# Patient Record
Sex: Female | Born: 1935 | Race: White | Hispanic: No | Smoking: Never smoker
Health system: Southern US, Community
[De-identification: ages and names within clinical notes are randomized; demographics above are authoritative.]

## PROBLEM LIST (undated history)

## (undated) DIAGNOSIS — J449 Chronic obstructive pulmonary disease, unspecified: Secondary | ICD-10-CM

## (undated) DIAGNOSIS — M1990 Unspecified osteoarthritis, unspecified site: Secondary | ICD-10-CM

## (undated) DIAGNOSIS — M797 Fibromyalgia: Secondary | ICD-10-CM

## (undated) DIAGNOSIS — H3530 Unspecified macular degeneration: Secondary | ICD-10-CM

## (undated) DIAGNOSIS — I10 Essential (primary) hypertension: Secondary | ICD-10-CM

## (undated) HISTORY — DX: Unspecified osteoarthritis, unspecified site: M19.90

## (undated) HISTORY — DX: Fibromyalgia: M79.7

## (undated) HISTORY — DX: Unspecified macular degeneration: H35.30

## (undated) HISTORY — PX: CHOLECYSTECTOMY: SHX55

---

## 1968-06-15 HISTORY — PX: ABDOMINAL HYSTERECTOMY: SHX81

## 1983-06-16 HISTORY — PX: BREAST SURGERY: SHX581

## 2004-07-14 ENCOUNTER — Ambulatory Visit: Payer: Self-pay | Admitting: Family Medicine

## 2005-06-04 ENCOUNTER — Ambulatory Visit: Payer: Self-pay | Admitting: Family Medicine

## 2005-09-08 ENCOUNTER — Ambulatory Visit: Payer: Self-pay | Admitting: Family Medicine

## 2005-11-13 ENCOUNTER — Ambulatory Visit: Payer: Self-pay | Admitting: Internal Medicine

## 2006-03-31 ENCOUNTER — Ambulatory Visit (HOSPITAL_COMMUNITY): Admission: RE | Admit: 2006-03-31 | Discharge: 2006-03-31 | Payer: Self-pay | Admitting: *Deleted

## 2007-02-04 ENCOUNTER — Ambulatory Visit: Payer: Self-pay | Admitting: Family Medicine

## 2007-03-07 ENCOUNTER — Ambulatory Visit: Payer: Self-pay | Admitting: Family Medicine

## 2007-03-10 ENCOUNTER — Ambulatory Visit: Payer: Self-pay | Admitting: Gastroenterology

## 2007-03-10 LAB — HM COLONOSCOPY

## 2008-04-19 ENCOUNTER — Ambulatory Visit: Payer: Self-pay | Admitting: Family Medicine

## 2008-12-11 ENCOUNTER — Ambulatory Visit: Payer: Self-pay | Admitting: Family Medicine

## 2010-04-29 ENCOUNTER — Ambulatory Visit: Payer: Self-pay | Admitting: Family Medicine

## 2010-09-04 ENCOUNTER — Encounter: Payer: Self-pay | Admitting: Neurology

## 2010-09-14 ENCOUNTER — Encounter: Payer: Self-pay | Admitting: Neurology

## 2011-08-26 ENCOUNTER — Ambulatory Visit: Payer: Self-pay | Admitting: Family Medicine

## 2011-09-03 ENCOUNTER — Ambulatory Visit: Payer: Self-pay | Admitting: Family Medicine

## 2012-05-11 ENCOUNTER — Ambulatory Visit: Payer: Self-pay | Admitting: Family Medicine

## 2012-12-22 ENCOUNTER — Emergency Department: Payer: Self-pay

## 2012-12-22 LAB — CBC
HCT: 42.1 % (ref 35.0–47.0)
MCH: 32.7 pg (ref 26.0–34.0)
MCHC: 35.1 g/dL (ref 32.0–36.0)
MCV: 93 fL (ref 80–100)
Platelet: 212 10*3/uL (ref 150–440)
RDW: 12.9 % (ref 11.5–14.5)

## 2012-12-22 LAB — BASIC METABOLIC PANEL
BUN: 10 mg/dL (ref 7–18)
Calcium, Total: 8.5 mg/dL (ref 8.5–10.1)
Chloride: 108 mmol/L — ABNORMAL HIGH (ref 98–107)
Creatinine: 0.87 mg/dL (ref 0.60–1.30)
EGFR (African American): >60
Glucose: 129 mg/dL — ABNORMAL HIGH (ref 65–99)
Osmolality: 280 (ref 275–301)
Potassium: 3.6 mmol/L (ref 3.5–5.1)

## 2012-12-22 LAB — TROPONIN I
Troponin-I: <0.02 ng/mL
Troponin-I: <0.02 ng/mL

## 2012-12-22 LAB — CK TOTAL AND CKMB (NOT AT ARMC): CK, Total: 111 U/L (ref 21–215)

## 2013-06-19 ENCOUNTER — Ambulatory Visit: Payer: Self-pay | Admitting: Family Medicine

## 2013-07-17 ENCOUNTER — Ambulatory Visit: Payer: Self-pay | Admitting: Family Medicine

## 2013-11-15 LAB — LIPID PANEL
Cholesterol: 269 mg/dL — AB (ref 0–200)
HDL: 39 mg/dL (ref 35–70)
LDL CALC: 197 mg/dL
LDl/HDL Ratio: 5.1
Triglycerides: 164 mg/dL — AB (ref 40–160)

## 2013-11-15 LAB — HEPATIC FUNCTION PANEL
ALK PHOS: 71 U/L (ref 25–125)
ALT: 16 U/L (ref 7–35)
AST: 22 U/L (ref 13–35)
BILIRUBIN, TOTAL: 0.7 mg/dL

## 2013-11-15 LAB — TSH: TSH: 2.11 u[IU]/mL (ref 0.41–5.90)

## 2013-11-15 LAB — BASIC METABOLIC PANEL
BUN: 11 mg/dL (ref 4–21)
Creatinine: 0.8 mg/dL (ref 0.5–1.1)
Glucose: 109 mg/dL
POTASSIUM: 4.3 mmol/L (ref 3.4–5.3)
Sodium: 141 mmol/L (ref 137–147)

## 2013-11-15 LAB — CBC AND DIFFERENTIAL
HCT: 44 % (ref 36–46)
Hemoglobin: 15.3 g/dL (ref 12.0–16.0)
NEUTROS ABS: 5 /uL
Platelets: 249 10*3/uL (ref 150–399)
WBC: 8.7 10*3/mL

## 2014-04-06 DIAGNOSIS — I2510 Atherosclerotic heart disease of native coronary artery without angina pectoris: Secondary | ICD-10-CM | POA: Insufficient documentation

## 2014-04-06 DIAGNOSIS — I10 Essential (primary) hypertension: Secondary | ICD-10-CM | POA: Insufficient documentation

## 2014-04-06 DIAGNOSIS — E782 Mixed hyperlipidemia: Secondary | ICD-10-CM | POA: Insufficient documentation

## 2014-05-15 LAB — HEMOGLOBIN A1C: HEMOGLOBIN A1C: 5.3 % (ref 4.0–6.0)

## 2014-05-28 ENCOUNTER — Ambulatory Visit: Payer: Self-pay | Admitting: Family Medicine

## 2014-05-28 LAB — HM MAMMOGRAPHY: HM MAMMO: NEGATIVE

## 2014-11-05 DIAGNOSIS — I10 Essential (primary) hypertension: Secondary | ICD-10-CM | POA: Insufficient documentation

## 2014-11-05 DIAGNOSIS — E785 Hyperlipidemia, unspecified: Secondary | ICD-10-CM | POA: Insufficient documentation

## 2014-11-05 DIAGNOSIS — K219 Gastro-esophageal reflux disease without esophagitis: Secondary | ICD-10-CM | POA: Insufficient documentation

## 2014-11-05 DIAGNOSIS — G64 Other disorders of peripheral nervous system: Secondary | ICD-10-CM | POA: Insufficient documentation

## 2014-11-05 DIAGNOSIS — M797 Fibromyalgia: Secondary | ICD-10-CM | POA: Insufficient documentation

## 2014-11-05 DIAGNOSIS — R739 Hyperglycemia, unspecified: Secondary | ICD-10-CM | POA: Insufficient documentation

## 2014-11-05 DIAGNOSIS — M1990 Unspecified osteoarthritis, unspecified site: Secondary | ICD-10-CM | POA: Insufficient documentation

## 2014-11-05 DIAGNOSIS — F32A Depression, unspecified: Secondary | ICD-10-CM | POA: Insufficient documentation

## 2014-11-05 DIAGNOSIS — G629 Polyneuropathy, unspecified: Secondary | ICD-10-CM | POA: Insufficient documentation

## 2014-11-05 DIAGNOSIS — F329 Major depressive disorder, single episode, unspecified: Secondary | ICD-10-CM | POA: Insufficient documentation

## 2014-11-05 DIAGNOSIS — E669 Obesity, unspecified: Secondary | ICD-10-CM | POA: Insufficient documentation

## 2014-11-05 DIAGNOSIS — E119 Type 2 diabetes mellitus without complications: Secondary | ICD-10-CM | POA: Insufficient documentation

## 2014-11-05 DIAGNOSIS — M549 Dorsalgia, unspecified: Secondary | ICD-10-CM | POA: Insufficient documentation

## 2014-12-25 ENCOUNTER — Ambulatory Visit (INDEPENDENT_AMBULATORY_CARE_PROVIDER_SITE_OTHER): Payer: Medicare Other | Admitting: Family Medicine

## 2014-12-25 ENCOUNTER — Encounter: Payer: Self-pay | Admitting: Family Medicine

## 2014-12-25 VITALS — BP 130/68 | HR 78 | Temp 98.4°F | Resp 16 | Wt 196.8 lb

## 2014-12-25 DIAGNOSIS — R7309 Other abnormal glucose: Secondary | ICD-10-CM | POA: Diagnosis not present

## 2014-12-25 DIAGNOSIS — M25579 Pain in unspecified ankle and joints of unspecified foot: Secondary | ICD-10-CM

## 2014-12-25 DIAGNOSIS — I10 Essential (primary) hypertension: Secondary | ICD-10-CM | POA: Diagnosis not present

## 2014-12-25 DIAGNOSIS — R7303 Prediabetes: Secondary | ICD-10-CM

## 2014-12-25 DIAGNOSIS — M797 Fibromyalgia: Secondary | ICD-10-CM | POA: Diagnosis not present

## 2014-12-25 DIAGNOSIS — K219 Gastro-esophageal reflux disease without esophagitis: Secondary | ICD-10-CM | POA: Diagnosis not present

## 2014-12-25 DIAGNOSIS — G629 Polyneuropathy, unspecified: Secondary | ICD-10-CM | POA: Diagnosis not present

## 2014-12-25 DIAGNOSIS — E785 Hyperlipidemia, unspecified: Secondary | ICD-10-CM

## 2014-12-25 NOTE — Progress Notes (Signed)
Patient ID: Eileen Walsh, female   DOB: Nov 23, 1928, 81 y.o.   MRN: [id number]    Subjective:  Hypertension This is a chronic problem. The current episode started more than 1 year ago. The problem is controlled. Associated symptoms include chest pain. Pertinent negatives include no anxiety, neck pain, palpitations or shortness of breath. (Chest pain and I found out it was gas" pt stated)  Gastrophageal Reflux She complains of chest pain. She reports no heartburn. not if I take omeprazole" pt stated. This is a chronic problem. The current episode started more than 1 year ago.    Last office visit was on 05/15/2014.  Prior to Admission medications   Medication Sig Start Date End Date Taking? Authorizing Provider  amLODipine-olmesartan (AZOR) 5-40 MG per tablet Take by mouth. 09/20/14  Yes Historical Provider, MD  MULTIPLE VITAMINS-MINERALS ER PO Take by mouth. 04/29/11  Yes Historical Provider, MD  Naproxen Sodium (ALEVE PO) Take 2 tablets by mouth. As needed for pain   Yes Historical Provider, MD  Omeprazole 20 MG TBEC Take by mouth. 09/20/14  Yes Historical Provider, MD  calcium carbonate (TUMS - DOSED IN MG ELEMENTAL CALCIUM) 500 MG chewable tablet Chew by mouth. 02/28/13   Historical Provider, MD  Dimethicone 1.2 % GEL MONISTAT SOOTHING CARE, 1.2% (External Gel)  1 (one) Gel Gel apply to affected area twice daily for 0 days  Quantity: 1;  Refills: 0   Ordered :09-Nov-2013  Janey Greaser ;  Started 09-Nov-2013 Active 11/09/13   Historical Provider, MD    Patient Active Problem List   Diagnosis Date Noted  . Arthritis 11/05/2014  . Back ache 11/05/2014  . Clinical depression 11/05/2014  . Diabetes 11/05/2014  . Fibrositis 11/05/2014  . Acid reflux 11/05/2014  . Blood glucose elevated 11/05/2014  . HLD (hyperlipidemia) 11/05/2014  . BP (high blood pressure) 11/05/2014  . Neuropathy 11/05/2014  . Adiposity 11/05/2014  . 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  .  Arteriosclerosis of coronary artery 04/06/2014  . Essential (primary) hypertension 04/06/2014  . 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/2014    No past medical history on file.  History   Social History  . Marital Status: Widowed    Spouse Name: N/A  . Number of Children: N/A  . Years of Education: N/A   Occupational History  . Not on file.   Social History Main Topics  . Smoking status: Never Smoker   . Smokeless tobacco: Not on file  . Alcohol Use: No  . Drug Use: No  . Sexual Activity: Not on file   Other Topics Concern  . Not on file   Social History Narrative    Allergies  Allergen Reactions  . Aspirin Other (See Comments)  . Sulfa Antibiotics Hives and Other (See Comments)    Review of Systems  Respiratory: Negative for shortness of breath.   Cardiovascular: Positive for chest pain. Negative for palpitations.  Gastrointestinal: Negative for heartburn.  Musculoskeletal: Negative for neck pain.       Right ankle pain, and swelling, but it looks like I have a problem with the ankle bone " pt stated  Neurological:       Peripheral neuropathy, numbness on both legs, mostly my feet" pt stated  Psychiatric/Behavioral: Negative.     Immunization History  Administered Date(s) Administered  . Pneumococcal Conjugate-13 05/15/2014  . Pneumococcal Polysaccharide-23 04/16/1997  . Td 06/30/2004   Objective:  BP 130/68 mmHg  Pulse 78  Temp(Src) 98.4 F (36.9  C) (Oral)  Resp 16  Wt 196 lb 12.8 oz (89.268 kg)  Physical Exam  Constitutional: She is oriented to person, place, and time and well-developed, well-nourished, and in no distress.  Obese  HENT:  Head: Normocephalic and atraumatic.  Right Ear: External ear normal.  Left Ear: External ear normal.  Nose: Nose normal.  Eyes: Conjunctivae are normal.  Neck: Normal range of motion. Neck supple.  Cardiovascular: Normal rate, regular rhythm and normal heart sounds.   Pulmonary/Chest:  Effort normal and breath sounds normal.  Abdominal: Soft. Bowel sounds are normal.  Neurological: She is alert and oriented to person, place, and time.  Skin: Skin is warm and dry.  Psychiatric: Mood, memory, affect and judgment normal.    Lab Results  Component Value Date   WBC 8.7 11/15/2013   HGB 15.3 11/15/2013   HCT 44 11/15/2013   PLT 249 11/15/2013   GLUCOSE 129* 12/22/2012   CHOL 269* 11/15/2013   TRIG 164* 11/15/2013   HDL 39 11/15/2013   LDLCALC 197 11/15/2013   TSH 2.11 11/15/2013   HGBA1C 5.3 05/15/2014    CMP     Component Value Date/Time   NA 141 11/15/2013   NA 140 12/22/2012 1428   K 4.3 11/15/2013   K 3.6 12/22/2012 1428   CL 108* 12/22/2012 1428   CO2 27 12/22/2012 1428   GLUCOSE 129* 12/22/2012 1428   BUN 11 11/15/2013   BUN 10 12/22/2012 1428   CREATININE 0.8 11/15/2013   CREATININE 0.87 12/22/2012 1428   CALCIUM 8.5 12/22/2012 1428   AST 22 11/15/2013   ALT 16 11/15/2013   ALKPHOS 71 11/15/2013   GFRNONAA >60 12/22/2012 1428   GFRAA >60 12/22/2012 1428    Assessment and Plan :  1. Hypertension 2. GERD 3. Osteoarthritis 4. Type 2 diabetes/prediabetes 5. Obesity 6. Chronic idiopathic neuropathy 7. Clinical history of fibromyalgia 8. Hyperlipidemia All issues are stable and all would benefit from weight loss.  Julieanne Mansonichard Gilbert MD [facility] 12/25/2014 2:06 PM

## 2015-06-20 ENCOUNTER — Other Ambulatory Visit: Payer: Self-pay | Admitting: Family Medicine

## 2015-08-26 ENCOUNTER — Other Ambulatory Visit: Payer: Self-pay | Admitting: Family Medicine

## 2015-12-25 ENCOUNTER — Other Ambulatory Visit: Payer: Self-pay | Admitting: Family Medicine

## 2016-06-16 ENCOUNTER — Ambulatory Visit: Payer: Medicare Other | Admitting: Family Medicine

## 2016-06-16 ENCOUNTER — Ambulatory Visit (INDEPENDENT_AMBULATORY_CARE_PROVIDER_SITE_OTHER): Payer: Medicare Other

## 2016-06-16 VITALS — BP 158/76 | HR 76 | Temp 98.1°F | Resp 14 | Ht 61.75 in | Wt 192.0 lb

## 2016-06-16 VITALS — BP 158/76 | HR 76 | Temp 98.1°F | Ht 61.75 in | Wt 192.4 lb

## 2016-06-16 DIAGNOSIS — E784 Other hyperlipidemia: Secondary | ICD-10-CM | POA: Diagnosis not present

## 2016-06-16 DIAGNOSIS — Z0000 Encounter for general adult medical examination without abnormal findings: Secondary | ICD-10-CM | POA: Diagnosis not present

## 2016-06-16 DIAGNOSIS — Z8639 Personal history of other endocrine, nutritional and metabolic disease: Secondary | ICD-10-CM

## 2016-06-16 DIAGNOSIS — I10 Essential (primary) hypertension: Secondary | ICD-10-CM

## 2016-06-16 DIAGNOSIS — E7849 Other hyperlipidemia: Secondary | ICD-10-CM

## 2016-06-16 NOTE — Patient Instructions (Signed)
Ms. Eileen Walsh , Thank you for taking time to come for your Medicare Wellness Visit. I appreciate your ongoing commitment to your health goals. Please review the following plan we discussed and let me know if I can assist you in the future.   These are the goals we discussed: Goals    . Increase water intake          Starting 06/16/2016, I will try to drink 2 glasses of water a day.       This is a list of the screening recommended for you and due dates:  Health Maintenance  Topic Date Due  . Complete foot exam   03/11/1941  . Hemoglobin A1C  11/14/2014  . DEXA scan (bone density measurement)  06/16/2017*  . Tetanus Vaccine  06/16/2017*  . Eye exam for diabetics  05/15/2017  . Flu Shot  Completed  . Shingles Vaccine  Completed  . Pneumonia vaccines  Completed  *Topic was postponed. The date shown is not the original due date.   Preventive Care for Adults  A healthy lifestyle and preventive care can promote health and wellness. Preventive health guidelines for adults include the following key practices.  . A routine yearly physical is a good way to check with your health care provider about your health and preventive screening. It is a chance to share any concerns and updates on your health and to receive a thorough exam.  . Visit your dentist for a routine exam and preventive care every 6 months. Brush your teeth twice a day and floss once a day. Good oral hygiene prevents tooth decay and gum disease.  . The frequency of eye exams is based on your age, health, family medical history, use  of contact lenses, and other factors. Follow your health care provider's ecommendations for frequency of eye exams.  . Eat a healthy diet. Foods like vegetables, fruits, whole grains, low-fat dairy products, and lean protein foods contain the nutrients you need without too many calories. Decrease your intake of foods high in solid fats, added sugars, and salt. Eat the right amount of calories for  you. Get information about a proper diet from your health care provider, if necessary.  . Regular physical exercise is one of the most important things you can do for your health. Most adults should get at least 150 minutes of moderate-intensity exercise (any activity that increases your heart rate and causes you to sweat) each week. In addition, most adults need muscle-strengthening exercises on 2 or more days a week.  Silver Sneakers may be a benefit available to you. To determine eligibility, you may visit the website: www.silversneakers.com or contact program at [phone] Mon-Fri between 8AM-8PM.   . Maintain a healthy weight. The body mass index (BMI) is a screening tool to identify possible weight problems. It provides an estimate of body fat based on height and weight. Your health care provider can find your BMI and can help you achieve or maintain a healthy weight.   For adults 20 years and older: ? A BMI below 18.5 is considered underweight. ? A BMI of 18.5 to 24.9 is normal. ? A BMI of 25 to 29.9 is considered overweight. ? A BMI of 30 and above is considered obese.   . Maintain normal blood lipids and cholesterol levels by exercising and minimizing your intake of saturated fat. Eat a balanced diet with plenty of fruit and vegetables. Blood tests for lipids and cholesterol should begin at age 81 and be  repeated every 5 years. If your lipid or cholesterol levels are high, you are over 50, or you are at high risk for heart disease, you may need your cholesterol levels checked more frequently. Ongoing high lipid and cholesterol levels should be treated with medicines if diet and exercise are not working.  . If you smoke, find out from your health care provider how to quit. If you do not use tobacco, please do not start.  . If you choose to drink alcohol, please do not consume more than 2 drinks per day. One drink is considered to be 12 ounces (355 mL) of beer, 5 ounces (148 mL) of  wine, or 1.5 ounces (44 mL) of liquor.  . If you are 39-37 years old, ask your health care provider if you should take aspirin to prevent strokes.  . Use sunscreen. Apply sunscreen liberally and repeatedly throughout the day. You should seek shade when your shadow is shorter than you. Protect yourself by wearing long sleeves, pants, a wide-brimmed hat, and sunglasses year round, whenever you are outdoors.  . Once a month, do a whole body skin exam, using a mirror to look at the skin on your back. Tell your health care provider of new moles, moles that have irregular borders, moles that are larger than a pencil eraser, or moles that have changed in shape or color.

## 2016-06-16 NOTE — Progress Notes (Signed)
Subjective:   Eileen Walsh is a 83 y.o. female who presents for Medicare Annual (Subsequent) preventive examination.  Review of Systems:  N/A  Cardiac Risk Factors include: advanced age (>31men, >68 women);obesity (BMI >30kg/m2);hypertension;dyslipidemia     Objective:     Vitals: BP (!) 158/76 (BP Location: Right Arm)   Pulse 76   Temp 98.1 F (36.7 C) (Oral)   Ht 5' 1.75" (1.568 m)   Wt 192 lb 6 oz (87.3 kg)   BMI 35.47 kg/m   Body mass index is 35.47 kg/m.   Tobacco History  Smoking Status  . Never Smoker  Smokeless Tobacco  . Never Used     Counseling given: Not Answered   Past Medical History:  Diagnosis Date  . Arthritis   . Fibromyalgia   . Macular degeneration    Past Surgical History:  Procedure Laterality Date  . ABDOMINAL HYSTERECTOMY  1970  . BREAST SURGERY  1985   Breast Bx   Family History  Problem Relation Age of Onset  . Hypertension Mother   . Arthritis Mother   . Hyperlipidemia Mother   . Heart disease Father   . Hypertension Brother   . Stroke Brother   . Diabetes Brother   . Arthritis Brother   . Heart disease Brother   . Anxiety disorder Brother     severe. had nervous breakdown   History  Sexual Activity  . Sexual activity: Not on file    Outpatient Encounter Prescriptions as of 06/16/2016  Medication Sig  . amLODipine-olmesartan (AZOR) 5-40 MG tablet TAKE 1 TABLET DAILY  . calcium carbonate (TUMS - DOSED IN MG ELEMENTAL CALCIUM) 500 MG chewable tablet Chew by mouth.   . MULTIPLE VITAMINS-MINERALS ER PO Take by mouth.  . Naproxen Sodium (ALEVE PO) Take 2 tablets by mouth. As needed for pain  . omeprazole (PRILOSEC) 20 MG capsule TAKE 1 CAPSULE IN THE MORNING  . Dimethicone 1.2 % GEL MONISTAT SOOTHING CARE, 1.2% (External Gel)  1 (one) Gel Gel apply to affected area twice daily for 0 days  Quantity: 1;  Refills: 0   Ordered :09-Nov-2013  Janey Greaser ;  Started 09-Nov-2013 Active  . Omeprazole 20 MG TBEC Take by  mouth.   No facility-administered encounter medications on file as of 06/16/2016.     Activities of Daily Living In your present state of health, do you have any difficulty performing the following activities: 06/16/2016  Hearing? Y  Vision? N  Difficulty concentrating or making decisions? N  Walking or climbing stairs? Y  Dressing or bathing? N  Doing errands, shopping? N  Preparing Food and eating ? N  Using the Toilet? N  In the past six months, have you accidently leaked urine? Y  Do you have problems with loss of bowel control? N  Managing your Medications? N  Managing your Finances? N  Housekeeping or managing your Housekeeping? N  Some recent data might be hidden    Patient Care Team: [name]., MD as PCP - General (Family Medicine) Lamar Blinks, MD as Consulting Physician (Cardiology) [place] Crane, MD as Consulting Physician (Ophthalmology)    Assessment:     Exercise Activities and Dietary recommendations Current Exercise Habits: The patient does not participate in regular exercise at present, Exercise limited by: Other - see comments (numbness in feet)  Goals    . Increase water intake          Starting 06/16/2016, I will try to  drink 2 glasses of water a day.      Fall Risk Fall Risk  06/16/2016 12/25/2014  Falls in the past year? Yes No  Number falls in past yr: 2 or more -  Injury with Fall? No -  Risk for fall due to : Impaired balance/gait History of fall(s)  Follow up Falls prevention discussed -   Depression Screen PHQ 2/9 Scores 06/16/2016 12/25/2014  PHQ - 2 Score 1 0     Cognitive Function     6CIT Screen 06/16/2016  What Year? 0 points  What month? 0 points  What time? 0 points  Count back from 20 2 points  Months in reverse 0 points  Repeat phrase 2 points  Total Score 4    Immunization History  Administered Date(s) Administered  . Pneumococcal Conjugate-13 05/15/2014  . Pneumococcal Polysaccharide-23 04/16/1997  . Td  06/30/2004   Screening Tests Health Maintenance  Topic Date Due  . FOOT EXAM  03/11/1941  . HEMOGLOBIN A1C  11/14/2014  . DEXA SCAN  06/16/2017 (Originally 03/11/1996)  . TETANUS/TDAP  06/16/2017 (Originally 06/30/2014)  . OPHTHALMOLOGY EXAM  05/15/2017  . INFLUENZA VACCINE  Completed  . ZOSTAVAX  Completed  . PNA vac Low Risk Adult  Completed      Plan:  I have personally reviewed and addressed the Medicare Annual Wellness questionnaire and have noted the following in the patient's chart:  A. Medical and social history B. Use of alcohol, tobacco or illicit drugs  C. Current medications and supplements D. Functional ability and status E.  Nutritional status F.  Physical activity G. Advance directives H. List of other physicians I.  Hospitalizations, surgeries, and ER visits in previous 12 months J.  Vitals K. Screenings such as hearing and vision if needed, cognitive and depression L. Referrals and appointments - none  In addition, I have reviewed and discussed with patient certain preventive protocols, quality metrics, and best practice recommendations. A written personalized care plan for preventive services as well as general preventive health recommendations were provided to patient.  See attached scanned questionnaire for additional information.   Signed,  Hyacinth MeekerMckenzie Markoski, LPN Nurse Health Advisor   MD Recommendations: Follow up on diabetic foot exam and Hgb A1c.  I have reviewed the health advisors note, was  available for consultation and I agree with documentation and plan. Julieanne Mansonichard Gilbert MD [facility]

## 2016-06-16 NOTE — Progress Notes (Signed)
Patient: Eileen Walsh, Female    DOB: 07-18-1928, 83 y.o.   MRN: [id number] Visit Date: 06/16/2016  Today's Provider: [name], MD   Chief Complaint  Patient presents with  . Annual Exam   Subjective:  Eileen Walsh is a 83 y.o. female who presents today for health maintenance and complete physical. She feels well. She reports exercising no. She reports she is sleeping well. Patient divorced once and then has been widowed from her second husband for 19 years.  Immunization History  Administered Date(s) Administered  . Pneumococcal Conjugate-13 05/15/2014  . Pneumococcal Polysaccharide-23 04/16/1997  . Td 06/30/2004   Last Mammogram 05/28/14  Colonoscopy 03/10/07 hyperplastic poyps and tubular adenoma Review of Systems  Constitutional: Negative.   HENT: Negative.   Eyes: Negative.   Respiratory: Negative.   Cardiovascular: Negative.   Gastrointestinal: Negative.   Endocrine: Negative.   Genitourinary: Negative.   Musculoskeletal: Negative.   Skin: Negative.   Allergic/Immunologic: Negative.   Neurological: Negative.   Hematological: Negative.   Psychiatric/Behavioral: Negative.     Social History   Social History  . Marital status: Widowed    Spouse name: N/A  . Number of children: N/A  . Years of education: N/A   Occupational History  . Not on file.   Social History Main Topics  . Smoking status: Never Smoker  . Smokeless tobacco: Never Used  . Alcohol use 0.6 oz/week    1 Glasses of wine per week  . Drug use: No  . Sexual activity: Not on file   Other Topics Concern  . Not on file   Social History Narrative  . No narrative on file    Patient Active Problem List   Diagnosis Date Noted  . Arthritis 11/05/2014  . Back ache 11/05/2014  . Clinical depression 11/05/2014  . Diabetes (HCC) 11/05/2014  . Fibrositis 11/05/2014  . Acid reflux 11/05/2014  . Blood glucose elevated 11/05/2014  . HLD (hyperlipidemia) 11/05/2014  . BP (high blood  pressure) 11/05/2014  . Neuropathy (HCC) 11/05/2014  . Adiposity 11/05/2014  . Disorder of peripheral nervous system (HCC) 11/05/2014  . Arteriosclerosis of coronary artery 04/06/2014  . Essential (primary) hypertension 04/06/2014  . 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/2014    Past Surgical History:  Procedure Laterality Date  . ABDOMINAL HYSTERECTOMY  1970  . BREAST SURGERY  1985   Breast Bx    Her family history includes Anxiety disorder in her brother; Arthritis in her brother and mother; Diabetes in her brother; Heart disease in her brother and father; Hyperlipidemia in her mother; Hypertension in her brother and mother; Stroke in her brother.     Outpatient Encounter Prescriptions as of 06/16/2016  Medication Sig Note  . amLODipine-olmesartan (AZOR) 5-40 MG tablet TAKE 1 TABLET DAILY   . calcium carbonate (TUMS - DOSED IN MG ELEMENTAL CALCIUM) 500 MG chewable tablet Chew by mouth.  11/05/2014: OTC Received from: [facility]  . Dimethicone 1.2 % GEL MONISTAT SOOTHING CARE, 1.2% (External Gel)  1 (one) Gel Gel apply to affected area twice daily for 0 days  Quantity: 1;  Refills: 0   Ordered :09-Nov-2013  Janey Greaser ;  Started 09-Nov-2013 Active 11/05/2014: Received from: [facility]  . MULTIPLE VITAMINS-MINERALS ER PO Take by mouth. 11/05/2014: Received from: [facility]  . Naproxen Sodium (ALEVE PO) Take 2 tablets by mouth. As needed for pain   . omeprazole (PRILOSEC) 20 MG capsule TAKE 1 CAPSULE IN THE  MORNING   . Omeprazole 20 MG TBEC Take by mouth. 11/05/2014: [facility]   No facility-administered encounter medications on file as of 06/16/2016.     Patient Care Team: Maple Hudsonichard L Andrya Roppolo Jr., MD as PCP - General (Family Medicine) Lamar BlinksBruce J Kowalski, MD as Consulting Physician (Cardiology) Nevada CraneBradley Mark King, MD as Consulting Physician (Ophthalmology)      Objective:    Vitals:  Vitals:   06/16/16 1410  BP: (!) 158/76  Pulse: 76  Resp: 14  Temp: 98.1 F (36.7 C)  Weight: 192 lb (87.1 kg)  Height: 5' 1.75" (1.568 m)    Physical Exam  Constitutional: She is oriented to person, place, and time. She appears well-developed and well-nourished.  HENT:  Head: Normocephalic and atraumatic.  Right Ear: External ear normal.  Left Ear: External ear normal.  Eyes: Conjunctivae are normal. Pupils are equal, round, and reactive to light.  Neck: Normal range of motion. Neck supple.  Cardiovascular: Normal rate, regular rhythm, normal heart sounds and intact distal pulses.   No murmur heard. Pulmonary/Chest: Effort normal and breath sounds normal. No respiratory distress. She has no wheezes.  Abdominal: She exhibits no distension. There is no tenderness.  Musculoskeletal: She exhibits no edema or tenderness.  Neurological: She is alert and oriented to person, place, and time.  Skin: Skin is warm. No erythema.  Psychiatric: She has a normal mood and affect. Her behavior is normal. Judgment and thought content normal.     Depression Screen PHQ 2/9 Scores 06/16/2016 12/25/2014  PHQ - 2 Score 1 0    Assessment & Plan:     Routine Health Maintenance and Physical Exam  Exercise Activities and Dietary recommendations Goals    . Increase water intake          Starting 06/16/2016, I will try to drink 2 glasses of water a day.      1. Annual physical exam - CBC w/Diff/Platelet - Comprehensive metabolic panel - Lipid Panel With LDL/HDL Ratio - TSH  2. Other hyperlipidemia - Comprehensive metabolic panel - Lipid Panel With LDL/HDL Ratio  3. Essential (primary) hypertension - CBC w/Diff/Platelet - Comprehensive metabolic panel - TSH  4. History of hyperglycemia - HgB A1c 5. Adjustment reaction/mild depression Patient declines any treatment for this. HPI, Exam and A&P transcribed under direction and in the presence of Julieanne Mansonichard Rene Gonsoulin, MD. I have  done the exam and reviewed the chart and it is accurate to the best of my knowledge. [employer]  technology has been used and  any errors in dictation or transcription are unintentional. Julieanne Mansonichard Nalin Mazzocco M.D. [facility]

## 2016-06-25 LAB — CBC WITH DIFFERENTIAL/PLATELET
Basophils Absolute: 0 10*3/uL (ref 0.0–0.2)
Basos: 1 %
EOS (ABSOLUTE): 0.2 10*3/uL (ref 0.0–0.4)
EOS: 2 %
HEMATOCRIT: 47 % — AB (ref 34.0–46.6)
HEMOGLOBIN: 15.2 g/dL (ref 11.1–15.9)
Immature Grans (Abs): 0 10*3/uL (ref 0.0–0.1)
Immature Granulocytes: 0 %
LYMPHS ABS: 2.4 10*3/uL (ref 0.7–3.1)
Lymphs: 30 %
MCH: 30.7 pg (ref 26.6–33.0)
MCHC: 32.3 g/dL (ref 31.5–35.7)
MCV: 95 fL (ref 79–97)
MONOCYTES: 8 %
MONOS ABS: 0.6 10*3/uL (ref 0.1–0.9)
NEUTROS ABS: 4.6 10*3/uL (ref 1.4–7.0)
Neutrophils: 59 %
Platelets: 231 10*3/uL (ref 150–379)
RBC: 4.95 x10E6/uL (ref 3.77–5.28)
RDW: 13.4 % (ref 12.3–15.4)
WBC: 7.8 10*3/uL (ref 3.4–10.8)

## 2016-06-25 LAB — COMPREHENSIVE METABOLIC PANEL
A/G RATIO: 1.4 (ref 1.2–2.2)
ALT: 25 IU/L (ref 0–32)
AST: 22 IU/L (ref 0–40)
Albumin: 4 g/dL (ref 3.5–4.7)
Alkaline Phosphatase: 72 IU/L (ref 39–117)
BUN/Creatinine Ratio: 13 (ref 12–28)
BUN: 11 mg/dL (ref 8–27)
Bilirubin Total: 0.5 mg/dL (ref 0.0–1.2)
CALCIUM: 9.4 mg/dL (ref 8.7–10.3)
CO2: 23 mmol/L (ref 18–29)
Chloride: 102 mmol/L (ref 96–106)
Creatinine, Ser: 0.87 mg/dL (ref 0.57–1.00)
GFR calc non Af Amer: 61 mL/min/{1.73_m2} (ref >59)
GFR, EST AFRICAN AMERICAN: 70 mL/min/{1.73_m2} (ref >59)
Globulin, Total: 2.9 g/dL (ref 1.5–4.5)
Glucose: 116 mg/dL — ABNORMAL HIGH (ref 65–99)
POTASSIUM: 4.2 mmol/L (ref 3.5–5.2)
Sodium: 141 mmol/L (ref 134–144)
Total Protein: 6.9 g/dL (ref 6.0–8.5)

## 2016-06-25 LAB — HEMOGLOBIN A1C
Est. average glucose Bld gHb Est-mCnc: 120 mg/dL
Hgb A1c MFr Bld: 5.8 % — ABNORMAL HIGH (ref 4.8–5.6)

## 2016-06-25 LAB — TSH: TSH: 3.1 u[IU]/mL (ref 0.450–4.500)

## 2016-06-25 LAB — LIPID PANEL WITH LDL/HDL RATIO
Cholesterol, Total: 268 mg/dL — ABNORMAL HIGH (ref 100–199)
HDL: 46 mg/dL (ref >39)
LDL CALC: 186 mg/dL — AB (ref 0–99)
LDL/HDL RATIO: 4 ratio — AB (ref 0.0–3.2)
TRIGLYCERIDES: 179 mg/dL — AB (ref 0–149)
VLDL CHOLESTEROL CAL: 36 mg/dL (ref 5–40)

## 2016-07-15 ENCOUNTER — Observation Stay
Admission: EM | Admit: 2016-07-15 | Discharge: 2016-07-17 | Disposition: A | Discharge status: Home / Self Care | Payer: Medicare Other | Attending: Specialist | Admitting: Specialist

## 2016-07-15 ENCOUNTER — Emergency Department: Payer: Medicare Other

## 2016-07-15 DIAGNOSIS — Z90710 Acquired absence of both cervix and uterus: Secondary | ICD-10-CM | POA: Diagnosis not present

## 2016-07-15 DIAGNOSIS — K760 Fatty (change of) liver, not elsewhere classified: Secondary | ICD-10-CM | POA: Diagnosis not present

## 2016-07-15 DIAGNOSIS — I2510 Atherosclerotic heart disease of native coronary artery without angina pectoris: Secondary | ICD-10-CM | POA: Diagnosis not present

## 2016-07-15 DIAGNOSIS — I10 Essential (primary) hypertension: Secondary | ICD-10-CM | POA: Diagnosis not present

## 2016-07-15 DIAGNOSIS — E785 Hyperlipidemia, unspecified: Secondary | ICD-10-CM | POA: Diagnosis not present

## 2016-07-15 DIAGNOSIS — R740 Nonspecific elevation of levels of transaminase and lactic acid dehydrogenase [LDH]: Secondary | ICD-10-CM

## 2016-07-15 DIAGNOSIS — M797 Fibromyalgia: Secondary | ICD-10-CM | POA: Diagnosis not present

## 2016-07-15 DIAGNOSIS — R7401 Elevation of levels of liver transaminase levels: Secondary | ICD-10-CM

## 2016-07-15 DIAGNOSIS — R1084 Generalized abdominal pain: Secondary | ICD-10-CM

## 2016-07-15 DIAGNOSIS — I4891 Unspecified atrial fibrillation: Secondary | ICD-10-CM | POA: Diagnosis not present

## 2016-07-15 DIAGNOSIS — R42 Dizziness and giddiness: Secondary | ICD-10-CM | POA: Diagnosis not present

## 2016-07-15 DIAGNOSIS — S0003XA Contusion of scalp, initial encounter: Secondary | ICD-10-CM | POA: Diagnosis not present

## 2016-07-15 DIAGNOSIS — R7303 Prediabetes: Secondary | ICD-10-CM | POA: Insufficient documentation

## 2016-07-15 DIAGNOSIS — K429 Umbilical hernia without obstruction or gangrene: Secondary | ICD-10-CM | POA: Insufficient documentation

## 2016-07-15 DIAGNOSIS — R509 Fever, unspecified: Secondary | ICD-10-CM | POA: Diagnosis not present

## 2016-07-15 DIAGNOSIS — K219 Gastro-esophageal reflux disease without esophagitis: Secondary | ICD-10-CM | POA: Insufficient documentation

## 2016-07-15 DIAGNOSIS — K7689 Other specified diseases of liver: Secondary | ICD-10-CM | POA: Diagnosis not present

## 2016-07-15 DIAGNOSIS — W06XXXA Fall from bed, initial encounter: Secondary | ICD-10-CM | POA: Insufficient documentation

## 2016-07-15 DIAGNOSIS — D72829 Elevated white blood cell count, unspecified: Secondary | ICD-10-CM | POA: Insufficient documentation

## 2016-07-15 DIAGNOSIS — H3530 Unspecified macular degeneration: Secondary | ICD-10-CM | POA: Diagnosis not present

## 2016-07-15 DIAGNOSIS — R1011 Right upper quadrant pain: Secondary | ICD-10-CM | POA: Insufficient documentation

## 2016-07-15 DIAGNOSIS — R633 Feeding difficulties, unspecified: Secondary | ICD-10-CM

## 2016-07-15 HISTORY — DX: Essential (primary) hypertension: I10

## 2016-07-15 LAB — COMPREHENSIVE METABOLIC PANEL
ALBUMIN: 3.8 g/dL (ref 3.5–5.0)
ALK PHOS: 96 U/L (ref 38–126)
ALT: 228 U/L — AB (ref 14–54)
AST: 319 U/L — AB (ref 15–41)
Anion gap: 13 (ref 5–15)
BUN: 15 mg/dL (ref 6–20)
CALCIUM: 8.7 mg/dL — AB (ref 8.9–10.3)
CHLORIDE: 103 mmol/L (ref 101–111)
CO2: 20 mmol/L — AB (ref 22–32)
CREATININE: 0.89 mg/dL (ref 0.44–1.00)
GFR calc non Af Amer: 57 mL/min — ABNORMAL LOW (ref >60)
GLUCOSE: 210 mg/dL — AB (ref 65–99)
Potassium: 3.5 mmol/L (ref 3.5–5.1)
SODIUM: 136 mmol/L (ref 135–145)
Total Bilirubin: 3.5 mg/dL — ABNORMAL HIGH (ref 0.3–1.2)
Total Protein: 7.2 g/dL (ref 6.5–8.1)

## 2016-07-15 LAB — CBC WITH DIFFERENTIAL/PLATELET
Basophils Absolute: 0.1 10*3/uL (ref 0–0.1)
Basophils Relative: 0 %
Eosinophils Absolute: 0.1 10*3/uL (ref 0–0.7)
Eosinophils Relative: 0 %
HCT: 45.6 % (ref 35.0–47.0)
Hemoglobin: 15.8 g/dL (ref 12.0–16.0)
Lymphocytes Relative: 2 %
Lymphs Abs: 0.4 10*3/uL — ABNORMAL LOW (ref 1.0–3.6)
MCH: 31.3 pg (ref 26.0–34.0)
MCHC: 34.5 g/dL (ref 32.0–36.0)
MCV: 90.7 fL (ref 80.0–100.0)
Monocytes Absolute: 1.1 10*3/uL — ABNORMAL HIGH (ref 0.2–0.9)
Monocytes Relative: 6 %
Neutro Abs: 17.5 10*3/uL — ABNORMAL HIGH (ref 1.4–6.5)
Neutrophils Relative %: 92 %
Platelets: 197 10*3/uL (ref 150–440)
RBC: 5.03 MIL/uL (ref 3.80–5.20)
RDW: 13.1 % (ref 11.5–14.5)
WBC: 19.1 10*3/uL — ABNORMAL HIGH (ref 3.6–11.0)

## 2016-07-15 LAB — PROTIME-INR
INR: 1.04
Prothrombin Time: 13.6 seconds (ref 11.4–15.2)

## 2016-07-15 LAB — TROPONIN I: TROPONIN I: 0.03 ng/mL — AB (ref <0.03)

## 2016-07-15 LAB — LIPASE, BLOOD: Lipase: 31 U/L (ref 11–51)

## 2016-07-15 LAB — APTT: aPTT: 29 seconds (ref 24–36)

## 2016-07-15 MED ORDER — ONDANSETRON HCL 4 MG/2ML IJ SOLN
4.0000 mg | Freq: Once | INTRAMUSCULAR | Status: AC
  Administered 2016-07-15: 4 mg via INTRAVENOUS
  Filled 2016-07-15: qty 2

## 2016-07-15 MED ORDER — IOPAMIDOL (ISOVUE-370) INJECTION 76%
100.0000 mL | Freq: Once | INTRAVENOUS | Status: AC | PRN
  Administered 2016-07-15: 100 mL via INTRAVENOUS

## 2016-07-15 MED ORDER — DILTIAZEM HCL 25 MG/5ML IV SOLN
15.0000 mg | Freq: Once | INTRAVENOUS | Status: AC
  Administered 2016-07-15: 15 mg via INTRAVENOUS
  Filled 2016-07-15: qty 5

## 2016-07-15 MED ORDER — SODIUM CHLORIDE 0.9 % IV BOLUS (SEPSIS)
1000.0000 mL | Freq: Once | INTRAVENOUS | Status: AC
  Administered 2016-07-15: 1000 mL via INTRAVENOUS

## 2016-07-15 NOTE — ED Triage Notes (Signed)
Pt arrives to ED from home via [facility] with c/o head inury d/t fall following several hours of nausea and possible fever. Pt reports she was leaning over the side of her bed to throw up and fell out of the bed hitting her head on a side table. Pt unsure of any LOC, presents with hematoma to LEFT side of forehead. EMS reports pt's VSS as 160/88 BP, HR 130's, O2 sats 97%.

## 2016-07-15 NOTE — ED Provider Notes (Signed)
[facility] Emergency Department Provider Note  ____________________________________________   First MD Initiated Contact with Patient 07/15/16 2152     (approximate)  I have reviewed the triage vital signs and the nursing notes.   HISTORY  Chief Complaint Nausea and Head Injury   HPI Eileen Walsh is a 83 y.o. female with a history of fibromyalgia as well as hypertension and GERD who is presenting to the emergency department today with right upper quadrant abdominal pain as well as nausea and dizziness. She denies any vomiting. She says that when she is sitting still she is not having any sensation of the room spinning but when she gets up or moves she feels like the room is spinning. She has a chronic ringing and roaring in her ears bilaterally which is unchanged. She says that she was also feeling right upper quadrant tenderness earlier but is having no pain at this time. Denies any history of atrial fibrillation and is not on any blood thinners. Says that she was feeling very nauseous after coming up from [place] today and went home and just got into bed. She says that when she was in bed she leaned over the side to vomit and fell off and hit the left frontal part of her head. She says that she thinks she may have lost consciousness but does not remember the exact details. She says that she is also having pain to the back of her neck.     Past Medical History:  Diagnosis Date  . Arthritis   . Fibromyalgia   . Hypertension   . Macular degeneration     Patient Active Problem List   Diagnosis Date Noted  . Arthritis 11/05/2014  . Back ache 11/05/2014  . Clinical depression 11/05/2014  . Diabetes (HCC) 11/05/2014  . Fibrositis 11/05/2014  . Acid reflux 11/05/2014  . Blood glucose elevated 11/05/2014  . HLD (hyperlipidemia) 11/05/2014  . BP (high blood pressure) 11/05/2014  . Neuropathy (HCC) 11/05/2014  . Adiposity 11/05/2014  . 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 Arteriosclerosis of coronary artery 04/06/2014  . Essential (primary) hypertension 04/06/2014  . 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/2014    Past Surgical History:  Procedure Laterality Date  . ABDOMINAL HYSTERECTOMY  1970  . BREAST SURGERY  1985   Breast Bx    Prior to Admission medications   Medication Sig Start Date End Date Taking? Authorizing Provider  amLODipine-olmesartan (AZOR) 5-40 MG tablet TAKE 1 TABLET DAILY 08/26/15  Yes Richard Hulen Shouts., MD  Multiple Vitamin (MULTIVITAMIN WITH MINERALS) TABS tablet Take 1 tablet by mouth daily.   Yes Historical Provider, MD  omeprazole (PRILOSEC) 20 MG capsule TAKE 1 CAPSULE IN THE MORNING 12/26/15  Yes Richard Hulen Shouts., MD    Allergies Aspirin and Sulfa antibiotics  Family History  Problem Relation Age of Onset  . Hypertension Mother   . Arthritis Mother   . Hyperlipidemia Mother   . Heart disease Father   . Hypertension Brother   . Stroke Brother   . Diabetes Brother   . Arthritis Brother   . Heart disease Brother   . Anxiety disorder Brother     severe. had nervous breakdown    Social History Social History  Substance Use Topics  . Smoking status: Never Smoker  . Smokeless tobacco: Never Used  . Alcohol use 0.6 oz/week    1 Glasses of wine per week    Review of  Systems Constitutional: No fever/chills Eyes: No visual changes. ENT: No sore throat. Cardiovascular: Denies chest pain. Respiratory: Denies shortness of breath. Gastrointestinal: no vomiting.  No diarrhea.  No constipation. Genitourinary: Negative for dysuria. Musculoskeletal: Negative for back pain. Skin: Negative for rash. Neurological: Negative for headaches, focal weakness or numbness.  10-point ROS otherwise negative.  ____________________________________________   PHYSICAL EXAM:  VITAL SIGNS: ED Triage Vitals  Enc Vitals Group     BP 07/15/16 2153 (!) 130/92      Pulse Rate 07/15/16 2153 (!) 138     Resp 07/15/16 2153 18     Temp 07/15/16 2153 98.4 F (36.9 C)     Temp Source 07/15/16 2153 Oral     SpO2 07/15/16 2153 95 %     Weight 07/15/16 2157 192 lb (87.1 kg)     Height 07/15/16 2157 5\' 2"  (1.575 m)     Head Circumference --      Peak Flow --      Pain Score 07/15/16 2157 0     Pain Loc --      Pain Edu? --      Excl. in GC? --     Constitutional: Alert and oriented. Well appearing and in no acute distress. Eyes: Conjunctivae are normal. PERRL. EOMI. Head: Atraumatic.Tympanic membranes normal bilaterally. Nose: No congestion/rhinnorhea. Mouth/Throat: Mucous membranes are moist.   Neck: No stridor.   Cardiovascular: Normal rate, regular rhythm. Grossly normal heart sounds.  Good peripheral circulation. Respiratory: Normal respiratory effort.  No retractions. Lungs CTAB. Gastrointestinal: Soft With mild diffuse tenderness which is worse than right upper quadrant. There is a negative Murphy sign. No distention.  Musculoskeletal: No lower extremity tenderness nor edema.  No joint effusions. Neurologic:  Normal speech and language. No gross focal neurologic deficits are appreciated.  Skin:  Skin is warm, dry and intact. No rash noted. Psychiatric: Mood and affect are normal. Speech and behavior are normal.  ____________________________________________   LABS (all labs ordered are listed, but only abnormal results are displayed)  Labs Reviewed  CBC WITH DIFFERENTIAL/PLATELET - Abnormal; Notable for the following:       Result Value   WBC 19.1 (*)    Neutro Abs 17.5 (*)    Lymphs Abs 0.4 (*)    Monocytes Absolute 1.1 (*)    All other components within normal limits  COMPREHENSIVE METABOLIC PANEL - Abnormal; Notable for the following:    CO2 20 (*)    Glucose, Bld 210 (*)    Calcium 8.7 (*)    AST 319 (*)    ALT 228 (*)    Total Bilirubin 3.5 (*)    GFR calc non Af Amer 57 (*)    All other components within normal limits    TROPONIN I - Abnormal; Notable for the following:    Troponin I 0.03 (*)    All other components within normal limits  LIPASE, BLOOD  PROTIME-INR  APTT  URINALYSIS, COMPLETE (UACMP) WITH MICROSCOPIC   ____________________________________________  EKG  ED ECG REPORT I, Arelia Longest, the attending physician, personally viewed and interpreted this ECG.   Date: 07/15/2016  EKG Time: 2152  Rate: 139  Rhythm: atrial fibrillation, rate 139  Axis: normal  Intervals:none  ST&T Change:  ST elevation in aVR. Multiple leads with depression. No abnormal T-wave inversions. Changes likely related to the patient's rate. Denies any chest pain or shortness of breath.  ____________________________________________  RADIOLOGY   DG Chest 1 View (Final result)  Result time 07/15/16 22:35:32  Final result by Rubye OaksMelanie Ehinger, MD (07/15/16 22:35:32)           Narrative:   CLINICAL DATA: New onset atrial fibrillation. Fall.  EXAM: CHEST 1 VIEW  COMPARISON: 07/17/2013  FINDINGS: The heart is normal in size. Prominent hila, right greater than left. There is diffuse bronchial and interstitial thickening throughout both lungs. No confluent airspace disease. No pleural fluid or pneumothorax. No evidence of acute osseous abnormality.  IMPRESSION: 1. Diffuse bronchial and interstitial thickening throughout both lungs, may be vascular congestion or bronchitic. 2. Normal heart size. Prominent hila, right greater than left. This may be enlarged pulmonary vessels, cannot exclude adenopathy. Chest CT with contrast would better defined.   Electronically Signed By: Rubye OaksMelanie Ehinger M.D.          ____________________________________________   PROCEDURES  Procedure(s) performed:   Procedures  Critical Care performed:    ____________________________________________   INITIAL IMPRESSION / ASSESSMENT AND PLAN / ED COURSE  Pertinent labs & imaging results that were  available during my care of the patient were reviewed by me and considered in my medical decision making (see chart for details).  ----------------------------------------- 11:48 PM on 07/15/2016 -----------------------------------------  Patient at this time with heart rate of 104 and still irregularly irregular on the monitor after Cardizem. Still without any chest pain or shortness of breath. Found to have elevated transaminases as well as bilirubin on her labs. Pending CAT scans at this time. Patient aware of need for admission of the hospital. Signed out to Dr. Manson PasseyBrown.      ____________________________________________   FINAL CLINICAL IMPRESSION(S) / ED DIAGNOSES  Abdominal pain. Hyperbilirubinemia. Transaminitis. New-onset A. fib.    NEW MEDICATIONS STARTED DURING THIS VISIT:  New Prescriptions   No medications on file     Note:  This document was prepared using Dragon voice recognition software and may include unintentional dictation errors.    Myrna Blazeravid Matthew Schaevitz, MD 07/15/16 [phone]

## 2016-07-15 NOTE — ED Notes (Signed)
Patient transported to CT 

## 2016-07-16 ENCOUNTER — Emergency Department: Payer: Medicare Other

## 2016-07-16 ENCOUNTER — Observation Stay
Admit: 2016-07-16 | Discharge: 2016-07-16 | Disposition: A | Discharge status: Home / Self Care | Payer: Medicare Other | Attending: Family Medicine | Admitting: Family Medicine

## 2016-07-16 DIAGNOSIS — I4891 Unspecified atrial fibrillation: Secondary | ICD-10-CM | POA: Diagnosis present

## 2016-07-16 LAB — COMPREHENSIVE METABOLIC PANEL
ALBUMIN: 3.5 g/dL (ref 3.5–5.0)
ALK PHOS: 82 U/L (ref 38–126)
ALT: 240 U/L — ABNORMAL HIGH (ref 14–54)
AST: 255 U/L — AB (ref 15–41)
Anion gap: 8 (ref 5–15)
BILIRUBIN TOTAL: 5 mg/dL — AB (ref 0.3–1.2)
BUN: 13 mg/dL (ref 6–20)
CALCIUM: 8.4 mg/dL — AB (ref 8.9–10.3)
CO2: 23 mmol/L (ref 22–32)
Chloride: 106 mmol/L (ref 101–111)
Creatinine, Ser: 0.81 mg/dL (ref 0.44–1.00)
GFR calc Af Amer: >60 mL/min (ref >60)
GFR calc non Af Amer: >60 mL/min (ref >60)
GLUCOSE: 121 mg/dL — AB (ref 65–99)
Potassium: 3.8 mmol/L (ref 3.5–5.1)
Sodium: 137 mmol/L (ref 135–145)
TOTAL PROTEIN: 6.4 g/dL — AB (ref 6.5–8.1)

## 2016-07-16 LAB — PHOSPHORUS: PHOSPHORUS: 2.8 mg/dL (ref 2.5–4.6)

## 2016-07-16 LAB — CBC
HEMATOCRIT: 40.6 % (ref 35.0–47.0)
Hemoglobin: 14.2 g/dL (ref 12.0–16.0)
MCH: 31.9 pg (ref 26.0–34.0)
MCHC: 34.9 g/dL (ref 32.0–36.0)
MCV: 91.5 fL (ref 80.0–100.0)
Platelets: 207 10*3/uL (ref 150–440)
RBC: 4.44 MIL/uL (ref 3.80–5.20)
RDW: 13 % (ref 11.5–14.5)
WBC: 18.6 10*3/uL — ABNORMAL HIGH (ref 3.6–11.0)

## 2016-07-16 LAB — GLUCOSE, CAPILLARY
GLUCOSE-CAPILLARY: 138 mg/dL — AB (ref 65–99)
Glucose-Capillary: 126 mg/dL — ABNORMAL HIGH (ref 65–99)
Glucose-Capillary: 126 mg/dL — ABNORMAL HIGH (ref 65–99)
Glucose-Capillary: 164 mg/dL — ABNORMAL HIGH (ref 65–99)
Glucose-Capillary: 132 mg/dL — ABNORMAL HIGH (ref 65–99)

## 2016-07-16 LAB — TROPONIN I
TROPONIN I: 0.08 ng/mL — AB (ref <0.03)
Troponin I: 0.06 ng/mL (ref <0.03)
Troponin I: 0.09 ng/mL (ref <0.03)
Troponin I: 0.06 ng/mL (ref <0.03)

## 2016-07-16 LAB — LIPID PANEL
Cholesterol: 186 mg/dL (ref 0–200)
HDL: 44 mg/dL (ref >40)
LDL CALC: 133 mg/dL — AB (ref 0–99)
TRIGLYCERIDES: 45 mg/dL (ref <150)
Total CHOL/HDL Ratio: 4.2 RATIO
VLDL: 9 mg/dL (ref 0–40)

## 2016-07-16 LAB — PROTIME-INR
INR: 1.2
Prothrombin Time: 15.3 seconds — ABNORMAL HIGH (ref 11.4–15.2)

## 2016-07-16 LAB — TSH: TSH: 1.866 u[IU]/mL (ref 0.350–4.500)

## 2016-07-16 LAB — MAGNESIUM: MAGNESIUM: 1.8 mg/dL (ref 1.7–2.4)

## 2016-07-16 MED ORDER — AMLODIPINE-OLMESARTAN 5-40 MG PO TABS: 1.0000 | ORAL_TABLET | Freq: Every day | ORAL | Status: DC

## 2016-07-16 MED ORDER — SODIUM CHLORIDE 0.9% FLUSH
3.0000 mL | Freq: Two times a day (BID) | INTRAVENOUS | Status: DC
  Administered 2016-07-16 – 2016-07-17 (×3): 3 mL via INTRAVENOUS

## 2016-07-16 MED ORDER — ENOXAPARIN SODIUM 100 MG/ML ~~LOC~~ SOLN
1.0000 mg/kg | Freq: Two times a day (BID) | SUBCUTANEOUS | Status: DC
  Administered 2016-07-16: 85 mg via SUBCUTANEOUS
  Filled 2016-07-16: qty 1

## 2016-07-16 MED ORDER — BISACODYL 5 MG PO TBEC: 5.0000 mg | DELAYED_RELEASE_TABLET | Freq: Every day | ORAL | Status: DC | PRN

## 2016-07-16 MED ORDER — MORPHINE SULFATE (PF) 2 MG/ML IV SOLN
INTRAVENOUS | Status: AC
  Administered 2016-07-16: 2 mg via INTRAVENOUS
  Filled 2016-07-16: qty 1

## 2016-07-16 MED ORDER — IRBESARTAN 75 MG PO TABS
300.0000 mg | ORAL_TABLET | Freq: Every day | ORAL | Status: DC
  Administered 2016-07-16 – 2016-07-17 (×2): 300 mg via ORAL
  Filled 2016-07-16 (×2): qty 4

## 2016-07-16 MED ORDER — INSULIN ASPART 100 UNIT/ML ~~LOC~~ SOLN: 0.0000 [IU] | Freq: Every day | SUBCUTANEOUS | Status: DC

## 2016-07-16 MED ORDER — INSULIN ASPART 100 UNIT/ML ~~LOC~~ SOLN: 0.0000 [IU] | SUBCUTANEOUS | Status: DC

## 2016-07-16 MED ORDER — ONDANSETRON HCL 4 MG PO TABS: 4.0000 mg | ORAL_TABLET | Freq: Four times a day (QID) | ORAL | Status: DC | PRN

## 2016-07-16 MED ORDER — INSULIN ASPART 100 UNIT/ML ~~LOC~~ SOLN
0.0000 [IU] | Freq: Three times a day (TID) | SUBCUTANEOUS | Status: DC
  Administered 2016-07-16: 2 [IU] via SUBCUTANEOUS
  Administered 2016-07-16: 1 [IU] via SUBCUTANEOUS
  Filled 2016-07-16: qty 2
  Filled 2016-07-16 (×2): qty 1

## 2016-07-16 MED ORDER — PIPERACILLIN-TAZOBACTAM 3.375 G IVPB 30 MIN
3.3750 g | Freq: Once | INTRAVENOUS | Status: AC
  Administered 2016-07-16: 3.375 g via INTRAVENOUS
  Filled 2016-07-16: qty 50

## 2016-07-16 MED ORDER — METOPROLOL TARTRATE 25 MG PO TABS
12.5000 mg | ORAL_TABLET | Freq: Two times a day (BID) | ORAL | Status: DC
  Administered 2016-07-16 – 2016-07-17 (×3): 12.5 mg via ORAL
  Filled 2016-07-16 (×3): qty 1

## 2016-07-16 MED ORDER — MORPHINE SULFATE (PF) 2 MG/ML IV SOLN
2.0000 mg | Freq: Once | INTRAVENOUS | Status: AC
  Administered 2016-07-16: 2 mg via INTRAVENOUS

## 2016-07-16 MED ORDER — ADULT MULTIVITAMIN W/MINERALS CH
1.0000 | ORAL_TABLET | Freq: Every day | ORAL | Status: DC
  Administered 2016-07-16 – 2016-07-17 (×2): 1 via ORAL
  Filled 2016-07-16 (×2): qty 1

## 2016-07-16 MED ORDER — ONDANSETRON HCL 4 MG/2ML IJ SOLN
4.0000 mg | Freq: Four times a day (QID) | INTRAMUSCULAR | Status: DC | PRN
  Administered 2016-07-16 (×2): 4 mg via INTRAVENOUS
  Filled 2016-07-16 (×2): qty 2

## 2016-07-16 MED ORDER — MECLIZINE HCL 25 MG PO TABS
25.0000 mg | ORAL_TABLET | Freq: Three times a day (TID) | ORAL | Status: DC | PRN
  Administered 2016-07-16: 25 mg via ORAL
  Filled 2016-07-16: qty 1

## 2016-07-16 MED ORDER — SODIUM CHLORIDE 0.9 % IV SOLN
INTRAVENOUS | Status: DC
  Administered 2016-07-16 – 2016-07-17 (×3): via INTRAVENOUS

## 2016-07-16 MED ORDER — ONDANSETRON HCL 4 MG/2ML IJ SOLN
4.0000 mg | Freq: Once | INTRAMUSCULAR | Status: AC
  Administered 2016-07-16: 4 mg via INTRAVENOUS

## 2016-07-16 MED ORDER — AMLODIPINE BESYLATE 5 MG PO TABS
5.0000 mg | ORAL_TABLET | Freq: Every day | ORAL | Status: DC
  Administered 2016-07-16 – 2016-07-17 (×2): 5 mg via ORAL
  Filled 2016-07-16 (×2): qty 1

## 2016-07-16 MED ORDER — ENOXAPARIN SODIUM 40 MG/0.4ML ~~LOC~~ SOLN
40.0000 mg | SUBCUTANEOUS | Status: DC
  Administered 2016-07-16: 40 mg via SUBCUTANEOUS
  Filled 2016-07-16: qty 0.4

## 2016-07-16 MED ORDER — ONDANSETRON HCL 4 MG/2ML IJ SOLN
INTRAMUSCULAR | Status: AC
Start: 2016-07-16 — End: 2016-07-16
  Administered 2016-07-16: 4 mg via INTRAVENOUS
  Filled 2016-07-16: qty 2

## 2016-07-16 MED ORDER — PANTOPRAZOLE SODIUM 40 MG PO TBEC
40.0000 mg | DELAYED_RELEASE_TABLET | Freq: Every day | ORAL | Status: DC
  Administered 2016-07-16 – 2016-07-17 (×2): 40 mg via ORAL
  Filled 2016-07-16 (×2): qty 1

## 2016-07-16 MED ORDER — OXYCODONE HCL 5 MG PO TABS
5.0000 mg | ORAL_TABLET | ORAL | Status: DC | PRN
  Administered 2016-07-16 – 2016-07-17 (×2): 5 mg via ORAL
  Filled 2016-07-16 (×2): qty 1

## 2016-07-16 MED ORDER — ALBUTEROL SULFATE (2.5 MG/3ML) 0.083% IN NEBU
2.5000 mg | INHALATION_SOLUTION | Freq: Four times a day (QID) | RESPIRATORY_TRACT | Status: DC | PRN
Start: 2016-07-16 — End: 2016-07-17

## 2016-07-16 MED ORDER — SENNOSIDES-DOCUSATE SODIUM 8.6-50 MG PO TABS: 1.0000 | ORAL_TABLET | Freq: Every evening | ORAL | Status: DC | PRN

## 2016-07-16 MED ORDER — PIPERACILLIN-TAZOBACTAM 3.375 G IVPB
3.3750 g | Freq: Three times a day (TID) | INTRAVENOUS | Status: DC
  Administered 2016-07-16: 3.375 g via INTRAVENOUS
  Filled 2016-07-16: qty 50

## 2016-07-16 MED ORDER — IPRATROPIUM BROMIDE 0.02 % IN SOLN: 0.5000 mg | Freq: Four times a day (QID) | RESPIRATORY_TRACT | Status: DC | PRN

## 2016-07-16 MED ORDER — DIAZEPAM 5 MG PO TABS
5.0000 mg | ORAL_TABLET | Freq: Once | ORAL | Status: AC
  Administered 2016-07-16: 5 mg via ORAL
  Filled 2016-07-16: qty 1

## 2016-07-16 MED ORDER — MAGNESIUM CITRATE PO SOLN: 1.0000 | Freq: Once | ORAL | Status: DC | PRN

## 2016-07-16 NOTE — ED Notes (Signed)
Attempted to call report, was informed nurse was in isolation room. Gave name and number to be contacted

## 2016-07-16 NOTE — Care Management CC44 (Signed)
Condition Code 44 Documentation Completed  Patient Details  Name: Lehman PromLucy B Kistner MRN: [id number] Date of Birth: 07-Oct-1928   Condition Code 44 given:  Yes Patient signature on Condition Code 44 notice:  Yes Documentation of 2 MD's agreement:  Yes Code 44 added to claim:  Yes    Eber HongGreene, Schyler Butikofer R, RN 07/16/2016, 8:30 PM

## 2016-07-16 NOTE — Evaluation (Signed)
Physical Therapy Evaluation Patient Details Name: Eileen Walsh MRN: [id number] DOB: May 18, 1929 Today's Date: 07/16/2016   History of Present Illness  83 y.o. female with a known history of OA, fibromyalgia, HTN, CAD, HLD, GERD was in a usual state of health until  today when she experienced sudden onset of right upper quadrant abdominal pain associated with nausea and dizziness but no vomiting or fevers.  She fell out of bed when she tried to vomit and sustained a head injury.  Clinical Impression  Pt reports that she has had dizziness with any activity and is hesitant to do a lot with PT.  This fear was founded in that after just a few feet of ambulation pt began to feel terrible with nausea, dry heaving and impulsive/unsafe return back to bed.  Pt wanting to go home if possible but feels terrible and generally agrees she would be completely unsafe to return home at this time.      Follow Up Recommendations SNF (pt hoping to be feeling better soon and go home)    Equipment Recommendations       Recommendations for Other Services       Precautions / Restrictions Precautions Precautions: Fall Restrictions Weight Bearing Restrictions: No      Mobility  Bed Mobility Overal bed mobility: Modified Independent             General bed mobility comments: Pt did well getting to EOB, minimal use of rails, able to get her LEs back into bed w/o direct assist  Transfers Overall transfer level: Modified independent Equipment used: Rolling walker (2 wheeled)             General transfer comment: Pt was able to rise w/o direct assist.  She was initially very hesitant, but was able to maintian balance and generally did well.   Ambulation/Gait Ambulation/Gait assistance: Mod assist;Max assist Ambulation Distance (Feet): 25 Feet Assistive device: Rolling walker (2 wheeled)       General Gait Details: Pt was able to take a few relatively safe/confident steps but then became very  nauseated/dizzy and very much struggled to safely get back to bed to lay down.  Pt was impulsive, dry heaving and clearly uncomfortable.    Stairs            Wheelchair Mobility    Modified Rankin (Stroke Patients Only)       Balance Overall balance assessment: Needs assistance Sitting-balance support: Bilateral upper extremity supported Sitting balance-Leahy Scale: Fair Sitting balance - Comments: Pt initially leaning backward and having some dizziness, able to recover and do better with time to "reset"     Standing balance-Leahy Scale: Poor Standing balance comment: Pt initially able to do some safe standing and small stepping, but became very uncomfortable, light headed and nauseaous and generally showed poor safety awareness.                             Pertinent Vitals/Pain Pain Assessment:  (pain at L forehead contusion site)    Home Living Family/patient expects to be discharged to:: Skilled nursing facility Living Arrangements: Alone               Additional Comments: Pt lives alone and is normally able to run errands, grocery shop, etc      Prior Function Level of Independence: Independent with assistive device(s);Independent               Hand  Dominance        Extremity/Trunk Assessment   Upper Extremity Assessment Upper Extremity Assessment: Overall WFL for tasks assessed;Generalized weakness    Lower Extremity Assessment Lower Extremity Assessment: Overall WFL for tasks assessed;Generalized weakness       Communication   Communication: No difficulties  Cognition Arousal/Alertness: Awake/alert Behavior During Therapy: Restless Overall Cognitive Status: Within Functional Limits for tasks assessed                      General Comments      Exercises     Assessment/Plan    PT Assessment Patient needs continued PT services  PT Problem List Decreased strength;Decreased activity tolerance;Decreased  balance;Decreased mobility;Decreased safety awareness          PT Treatment Interventions DME instruction;Gait training;Stair training;Functional mobility training;Therapeutic activities;Balance training;Therapeutic exercise;Neuromuscular re-education;Patient/family education    PT Goals (Current goals can be found in the Care Plan section)  Acute Rehab PT Goals Patient Stated Goal: "Get this dizziness to go away" PT Goal Formulation: With patient Time For Goal Achievement: 07/30/16 Potential to Achieve Goals: Fair    Frequency Min 2X/week   Barriers to discharge        Co-evaluation               End of Session Equipment Utilized During Treatment: Gait belt Activity Tolerance: Other (comment) (nausea, dizziness, lightheadedness) Patient left: with call bell/phone within reach;with bed alarm set;with family/visitor present Nurse Communication: Mobility status    Functional Assessment Tool Used: clinical judgement Functional Limitation: Mobility: Walking and moving around Mobility: Walking and Moving Around Current Status ([id number]): At least 60 percent but less than 80 percent impaired, limited or restricted Mobility: Walking and Moving Around Goal Status [phone]([id number]): At least 1 percent but less than 20 percent impaired, limited or restricted    Time: 0908-0931 PT Time Calculation (min) (ACUTE ONLY): 23 min   Charges:   PT Evaluation $PT Eval Low Complexity: 1 Procedure     PT G Codes:   PT G-Codes **NOT FOR INPATIENT CLASS** Functional Assessment Tool Used: clinical judgement Functional Limitation: Mobility: Walking and moving around Mobility: Walking and Moving Around Current Status ([id number]([id number]): At least 60 percent but less than 80 percent impaired, limited or restricted Mobility: Walking and Moving Around Goal Status [phone]): At least 1 percent but less than 20 percent impaired, limited or restricted    Malachi ProGalen R Timea Breed, DPT 07/16/2016, 11:18 AM

## 2016-07-16 NOTE — Progress Notes (Signed)
*  PRELIMINARY RESULTS* Echocardiogram 2D Echocardiogram has been performed.  Cristela BlueHege, Lulubelle Simcoe 07/16/2016, 3:16 PM

## 2016-07-16 NOTE — H&P (Addendum)
History and Physical   [facility] - [place] [facility] Admission History and Physical [employer], D.O.    Patient Name: Eileen Walsh MR#: [id number] Date of Birth: 02-08-1929 Date of Admission: 07/15/2016  Referring MD/NP/PA: Dr. Pershing Proud Primary Care Physician: [name], MD  Patient coming from: Home  Chief Complaint: Nausea  HPI: [name] is a 83 y.o. female with a known history of OA, fibromyalgia, HTN, CAD, HLD, GERD was in a usual state of health until  today when she experienced sudden onset of right upper quadrant abdominal pain associated with nausea and dizziness but no vomiting or fevers.  She fell out of bed when she tried to vomit and sustained a head injury.. She says that she thinks she may have lost consciousness but does not remember the exact details. She says that she is also having pain to the back of her neck.    Otherwise there has been no change in status. Patient has been taking medication as prescribed and there has been no recent change in medication or diet.  No recent antibiotics.  There has been no recent illness, hospitalizations, travel or sick contacts.    Patient denies fevers/chills, weakness, chest pain, shortness of breath,  dysuria/frequency, changes in mental status.   ED Course: In the ED patient rec'd Cardizem, Morphine, Zofran, Zosyn, IVNS  Review of Systems:  CONSTITUTIONAL: No fever/chills, fatigue, weakness, weight gain/loss, headache. EYES: No blurry or double vision. ENT: No tinnitus, postnasal drip, redness or soreness of the oropharynx. RESPIRATORY: No cough, dyspnea, wheeze.  No hemoptysis.  CARDIOVASCULAR: No chest pain, palpitations, syncope, orthopnea. No lower extremity edema.  GASTROINTESTINAL: No nausea, vomiting, abdominal pain, diarrhea, constipation.  No hematemesis, melena or hematochezia. GENITOURINARY: No dysuria, frequency, hematuria. ENDOCRINE: No polyuria or nocturia. No heat or cold  intolerance. HEMATOLOGY: No anemia, bruising, bleeding. INTEGUMENTARY: No rashes, ulcers, lesions. MUSCULOSKELETAL: No arthritis, gout, dyspnea. NEUROLOGIC: No numbness, tingling, ataxia, seizure-type activity, weakness. PSYCHIATRIC: No anxiety, depression, insomnia.   Past Medical History:  Diagnosis Date  . Arthritis   . Fibromyalgia   . Hypertension   . Macular degeneration     Past Surgical History:  Procedure Laterality Date  . ABDOMINAL HYSTERECTOMY  1970  . BREAST SURGERY  1985   Breast Bx     reports that she has never smoked. She has never used smokeless tobacco. She reports that she drinks about 0.6 oz of alcohol per week . She reports that she does not use drugs.  Allergies  Allergen Reactions  . Aspirin Other (See Comments)  . Sulfa Antibiotics Hives and Other (See Comments)    Family History  Problem Relation Age of Onset  . Hypertension Mother   . Arthritis Mother   . Hyperlipidemia Mother   . Heart disease Father   . Hypertension Brother   . Stroke Brother   . Diabetes Brother   . Arthritis Brother   . Heart disease Brother   . Anxiety disorder Brother     severe. had nervous breakdown   Family history has been reviewed and confirmed with patient.   Prior to Admission medications   Medication Sig Start Date End Date Taking? Authorizing Provider  amLODipine-olmesartan (AZOR) 5-40 MG tablet TAKE 1 TABLET DAILY 08/26/15  Yes Richard Hulen Shouts., MD  Multiple Vitamin (MULTIVITAMIN WITH MINERALS) TABS tablet Take 1 tablet by mouth daily.   Yes Historical Provider, MD  omeprazole (PRILOSEC) 20 MG capsule TAKE 1 CAPSULE IN THE MORNING 12/26/15  Yes [name]., MD    Physical Exam: Vitals:   07/15/16 2300 07/15/16 2350 07/16/16 0030 07/16/16 0100  BP: 119/72  111/79 131/65  Pulse: (!) 114 90 (!) 113 79  Resp: 18 20 18  (!) 22  Temp:      TempSrc:      SpO2: 95% 96% 97% 97%  Weight:      Height:        GENERAL: 83 y.o.-year-old female  patient, well-developed, well-nourished lying in the bed in no acute distress.  Pleasant and cooperative.   HEENT: Head atraumatic, normocephalic. Pupils equal, round, reactive to light and accommodation. No scleral icterus. Extraocular muscles intact. Nares are patent. Oropharynx is clear. Mucus membranes moist. NECK: Supple, full range of motion. No JVD, no bruit heard. No thyroid enlargement, no tenderness, no cervical lymphadenopathy. CHEST: Normal breath sounds bilaterally. No wheezing, rales, rhonchi or crackles. No use of accessory muscles of respiration.  No reproducible chest wall tenderness.  CARDIOVASCULAR: S1, S2 normal. No murmurs, rubs, or gallops. Cap refill <2 seconds. Pulses intact distally.  ABDOMEN: Soft, nondistended, mild diffuse tenderness to palpation. No rebound, guarding, rigidity. Normoactive bowel sounds present in all four quadrants. No organomegaly or mass. EXTREMITIES: No pedal edema, cyanosis, or clubbing. No calf tenderness or Homan's sign.  NEUROLOGIC: The patient is alert and oriented x 3. Cranial nerves II through XII are grossly intact with no focal sensorimotor deficit. Muscle strength 5/5 in all extremities. Sensation intact. Gait not checked. PSYCHIATRIC:  Normal affect, mood, thought content. SKIN: Warm, dry, and intact without obvious rash, lesion, or ulcer.    Labs on Admission:  CBC:  Recent Labs Lab 07/15/16 2202  WBC 19.1*  NEUTROABS 17.5*  HGB 15.8  HCT 45.6  MCV 90.7  PLT 197   Basic Metabolic Panel:  Recent Labs Lab 07/15/16 2202  NA 136  K 3.5  CL 103  CO2 20*  GLUCOSE 210*  BUN 15  CREATININE 0.89  CALCIUM 8.7*   GFR: Estimated Creatinine Clearance: 47.3 mL/min (by C-G formula based on SCr of 0.89 mg/dL). Liver Function Tests:  Recent Labs Lab 07/15/16 2202  AST 319*  ALT 228*  ALKPHOS 96  BILITOT 3.5*  PROT 7.2  ALBUMIN 3.8    Recent Labs Lab 07/15/16 2202  LIPASE 31   No results for input(s): AMMONIA in  the last 168 hours. Coagulation Profile:  Recent Labs Lab 07/15/16 2202  INR 1.04   Cardiac Enzymes:  Recent Labs Lab 07/15/16 2202  TROPONINI 0.03*   BNP (last 3 results) No results for input(s): PROBNP in the last 8760 hours. HbA1C: No results for input(s): HGBA1C in the last 72 hours. CBG: No results for input(s): GLUCAP in the last 168 hours. Lipid Profile: No results for input(s): CHOL, HDL, LDLCALC, TRIG, CHOLHDL, LDLDIRECT in the last 72 hours. Thyroid Function Tests: No results for input(s): TSH, T4TOTAL, FREET4, T3FREE, THYROIDAB in the last 72 hours. Anemia Panel: No results for input(s): VITAMINB12, FOLATE, FERRITIN, TIBC, IRON, RETICCTPCT in the last 72 hours. Urine analysis: No results found for: COLORURINE, APPEARANCEUR, LABSPEC, PHURINE, GLUCOSEU, HGBUR, BILIRUBINUR, KETONESUR, PROTEINUR, UROBILINOGEN, NITRITE, LEUKOCYTESUR Sepsis Labs: @LABRCNTIP (procalcitonin:4,lacticidven:4) )No results found for this or any previous visit (from the past 240 hour(s)).   Radiological Exams on Admission: Dg Chest 1 View  Result Date: 07/15/2016 CLINICAL DATA:  New onset atrial fibrillation.  Fall. EXAM: CHEST 1 VIEW COMPARISON:  07/17/2013 FINDINGS: The heart is normal in size. Prominent hila, right greater than  left. There is diffuse bronchial and interstitial thickening throughout both lungs. No confluent airspace disease. No pleural fluid or pneumothorax. No evidence of acute osseous abnormality. IMPRESSION: 1. Diffuse bronchial and interstitial thickening throughout both lungs, may be vascular congestion or bronchitic. 2. Normal heart size. Prominent hila, right greater than left. This may be enlarged pulmonary vessels, cannot exclude adenopathy. Chest CT with contrast would better defined. Electronically Signed   By: Rubye OaksMelanie  Ehinger M.D.   On: 07/15/2016 22:35   Ct Head Wo Contrast  Result Date: 07/15/2016 CLINICAL DATA:  Larey SeatFell several hours ago while leaning over the side  of the bed to vomit. Struck head on a side table. EXAM: CT HEAD WITHOUT CONTRAST CT CERVICAL SPINE WITHOUT CONTRAST TECHNIQUE: Multidetector CT imaging of the head and cervical spine was performed following the standard protocol without intravenous contrast. Multiplanar CT image reconstructions of the cervical spine were also generated. COMPARISON:  None. FINDINGS: CT HEAD FINDINGS Brain: There is no intracranial hemorrhage, mass or evidence of acute infarction. There is moderate generalized atrophy. There is moderate chronic microvascular ischemic change. There is no significant extra-axial fluid collection. No acute intracranial findings are evident. Vascular: No hyperdense vessel or unexpected calcification. Skull: Normal. Negative for fracture or focal lesion. Sinuses/Orbits: No acute finding. Other: Left frontal scalp hematoma CT CERVICAL SPINE FINDINGS Alignment: Normal. Skull base and vertebrae: No acute fracture. No primary bone lesion or focal pathologic process. Soft tissues and spinal canal: No prevertebral fluid or swelling. No visible canal hematoma. Disc levels: Moderate degenerative cervical disc disease, greatest at C6-7. Mild degenerative appearing anterolisthesis at C4-5. Facet articulations are moderately arthritic but intact. Upper chest: Negative. Other: None IMPRESSION: 1. No acute intracranial findings. There is moderate generalized atrophy and chronic appearing white matter hypodensities which likely represent small vessel ischemic disease. 2. Negative for acute cervical spine fracture Electronically Signed   By: Ellery Plunkaniel R Mitchell M.D.   On: 07/15/2016 23:46   Ct Cervical Spine Wo Contrast  Result Date: 07/15/2016 CLINICAL DATA:  Larey SeatFell several hours ago while leaning over the side of the bed to vomit. Struck head on a side table. EXAM: CT HEAD WITHOUT CONTRAST CT CERVICAL SPINE WITHOUT CONTRAST TECHNIQUE: Multidetector CT imaging of the head and cervical spine was performed following the  standard protocol without intravenous contrast. Multiplanar CT image reconstructions of the cervical spine were also generated. COMPARISON:  None. FINDINGS: CT HEAD FINDINGS Brain: There is no intracranial hemorrhage, mass or evidence of acute infarction. There is moderate generalized atrophy. There is moderate chronic microvascular ischemic change. There is no significant extra-axial fluid collection. No acute intracranial findings are evident. Vascular: No hyperdense vessel or unexpected calcification. Skull: Normal. Negative for fracture or focal lesion. Sinuses/Orbits: No acute finding. Other: Left frontal scalp hematoma CT CERVICAL SPINE FINDINGS Alignment: Normal. Skull base and vertebrae: No acute fracture. No primary bone lesion or focal pathologic process. Soft tissues and spinal canal: No prevertebral fluid or swelling. No visible canal hematoma. Disc levels: Moderate degenerative cervical disc disease, greatest at C6-7. Mild degenerative appearing anterolisthesis at C4-5. Facet articulations are moderately arthritic but intact. Upper chest: Negative. Other: None IMPRESSION: 1. No acute intracranial findings. There is moderate generalized atrophy and chronic appearing white matter hypodensities which likely represent small vessel ischemic disease. 2. Negative for acute cervical spine fracture Electronically Signed   By: Ellery Plunkaniel R Mitchell M.D.   On: 07/15/2016 23:46   Ct Angio Abd/pel W And/or Wo Contrast  Result Date: 07/16/2016 CLINICAL DATA:  Diffuse  abdominal pain. Fall out of bed. New onset atrial fibrillation. EXAM: CTA ABDOMEN AND PELVIS wITHOUT AND WITH CONTRAST TECHNIQUE: Multidetector CT imaging of the abdomen and pelvis was performed using the standard protocol during bolus administration of intravenous contrast. Multiplanar reconstructed images and MIPs were obtained and reviewed to evaluate the vascular anatomy. CONTRAST:  100 cc Isovue 370 IV COMPARISON:  None. FINDINGS: VASCULAR Aorta:  Normal caliber aorta without aneurysm, dissection, vasculitis or significant stenosis. Moderate atherosclerosis. Tiny accessory artery arises from the uppermost thoracic aorta superior to the celiac artery. Celiac: Patent without evidence of aneurysm, dissection, vasculitis or significant stenosis. Mild branch atherosclerosis. SMA: Patent without evidence of aneurysm, dissection, vasculitis or significant stenosis. Renals: Both renal arteries are patent without evidence of aneurysm, dissection, vasculitis, fibromuscular dysplasia or significant stenosis. IMA: Patent without evidence of aneurysm, dissection, vasculitis or significant stenosis. Inflow: Patent without evidence of aneurysm, dissection, vasculitis or significant stenosis. Proximal Outflow: Bilateral common femoral and visualized portions of the superficial and profunda femoral arteries are patent without evidence of aneurysm, dissection, vasculitis or significant stenosis. Veins: No acute abnormality of the a iliac, IVC, mesenteric or portal veins. Review of the MIP images confirms the above findings. NON-VASCULAR Lower chest: Minimal patchy ground-glass opacity in the dependent right lower lobe, with a possible solid component measuring 10 mm. No fractures of the included ribs. The heart is normal in size. Hepatobiliary: Scattered low-density lesions throughout the liver, largest in the right lobe measures 2.9 cm. Larger lesions are consistent with simple cysts, smaller lesions are too small to characterize but likely small cysts. No suspicious hepatic lesion. Gallbladder physiologically distended, no calcified stone. No biliary dilatation. Pancreas: No ductal dilatation or inflammation. Spleen: Normal in size without focal abnormality. Adrenals/Urinary Tract: A 5.0 x 3.2 cm well-defined structure in the left upper quadrant appears discretely separate from the left adrenal gland. There is no definite adrenal nodule. No hydronephrosis or perinephric  edema. Tiny cortical hypodensities in both kidneys, too small to characterize. Urinary bladder is physiologically distended. No bladder wall thickening. Stomach/Bowel: No stomach is decompressed. Descending and sigmoid colonic diverticulosis without acute inflammation. Normal appendix. No evidence of bowel wall thickening, distention or obstruction. Lymphatic: A few scattered mesenteric nodes in the left lower quadrant. No retroperitoneal adenopathy. No pelvic adenopathy. Reproductive: Post hysterectomy. Left ovary is quiescent. Mixed density structure in the right adnexa measures 4.5 x 3.6 cm, is heterogeneous and contains some internal calcifications, with both cystic and more soft tissue/solid components. Other: There is a well-defined ovoid structure in the left upper quadrant adjacent to the medial spleen and gastric cardia measuring 3.2 x 5.0 cm. Density suggests soft tissue or complex fluid, and etiology is indeterminate. Tiny fat containing umbilical hernia. No free air or free fluid in the abdomen or pelvis. Musculoskeletal: No acute fracture of the bony pelvis, lumbar spine or included ribs. Multilevel degenerative change in the spine. There are no acute or suspicious osseous abnormalities. IMPRESSION: VASCULAR 1. No acute vascular abnormality. 2. Moderate atherosclerosis. NON-VASCULAR 1. No acute finding in the abdomen or pelvis. A ground-glass opacity in the dependent right lower lobe is nonspecific, and may be infectious/inflammatory. There is a possible solid component. Follow-up non-contrast CT recommended at 3-6 months to confirm persistence. If unchanged, and solid component remains <6 mm, annual CT is recommended until 5 years of stability has been established. If persistent these nodules should be considered highly suspicious if the solid component of the nodule is 6 mm or greater in size and  enlarging. This recommendation follows the consensus statement: Guidelines for Management of Incidental  Pulmonary Nodules Detected on CT Images: From the Fleischner Society 2017; Radiology 2017; 284:228-243. 2. Complex right adnexal mass, 4.5 cm greatest dimension. No prior exams for comparison, and patient is post hysterectomy. Prior exams would be most helpful if available, in the absence of prior imaging, recommend nonemergent ultrasound characterization. If not visualized sonographically and no prior exams are available, consider further evaluation with pelvic MRI or surgical evaluation. 3. Left upper quadrant structure may be well-defined complex fluid or solid lesion, abutting the medial spleen and posterior stomach. This is indeterminate. Again, comparison with any prior imaging would be most helpful. This may be a lymphangioma, however exophytic gastric or splenic lesion is considered. 4. Colonic diverticulosis without diverticulitis. Incidental hepatic cysts. Electronically Signed   By: Rubye Oaks M.D.   On: 07/16/2016 00:17    EKG: Atrial fibrillation at 139bpm with normal axis and nonspecific ST-T wave changes.   Assessment/Plan Active Problems:   * No active hospital problems. *    This is a 83 y.o. female with a history of OA, fibromyalgia, HTN, CAD, HLD, GERD  now being admitted with:  1. New onset atrial fibrillation - Admit inpatient telemetry - Start Lopressor.  Patient allergic to aspirin - Check trops, TSH, lipids - Check echo - Cardiology consultation has been requested. - CHADSVasc is 4.  Will give weight based Lovenox   2. Abdominal pain with elevated LFTs - Follow up US - Check hepatitis panel - Avoid Tylenol - Pain control and antiemetics - Right adnexal lesion needs [place] and follow up.   3. Hyperglycemia - CBG with RISS - Check A1C  4. Leukocytosis, unclear source ?CAP, distended gallbladder - Continue Zosyn for now - Check CBC in am - Follow up blood, urine cultures - Questionable ground glass opacity in RLL needs follow up.   5. History of HTN -  Continue amlodipine/olmesartan  Admission status: Inpatient, telemetry IV Fluids: IVNS Diet/Nutrition: NPO Consults called: Cardiology Dr. Laurena Bering DVT Px: Lovenox, SCDs and early ambulation. Code Status: Full Code  Disposition Plan: To home in 2-3 days   All the records are reviewed and case discussed with ED provider. Management plans discussed with the patient and/or family who express understanding and agree with plan of care.  Keerstin Bjelland D.O. on 07/16/2016 at 2:52 AM Between 7am to 6pm - Pager - [phone] After 6pm go to [email or web address] - Social research officer, government [facility] [place] [facility] Office [phone] CC: Primary care physician; [name], MD   07/16/2016, 2:52 AM

## 2016-07-16 NOTE — Progress Notes (Signed)
Pharmacy Antibiotic Note  Lehman PromLucy B Nicotra is a 83 y.o. female admitted on 07/15/2016 with CAP.  Pharmacy has been consulted for Zosyn dosing.  Plan: Zosyn 3.375 gm IV Q8H EI  Height: 5\' 2"  (157.5 cm) Weight: 192 lb (87.1 kg) IBW/kg (Calculated) : 50.1  Temp (24hrs), Avg:98.1 F (36.7 C), Min:97.8 F (36.6 C), Max:98.4 F (36.9 C)   Recent Labs Lab 07/15/16 2202  WBC 19.1*  CREATININE 0.89    Estimated Creatinine Clearance: 47.3 mL/min (by C-G formula based on SCr of 0.89 mg/dL).    Allergies  Allergen Reactions  . Aspirin Other (See Comments)  . Sulfa Antibiotics Hives and Other (See Comments)   Thank you for allowing pharmacy to be a part of this patient's care.  Carola FrostNathan A Khaleel Beckom, Pharm.D., BCPS Clinical Pharmacist 07/16/2016 5:05 AM

## 2016-07-16 NOTE — Plan of Care (Signed)
Problem: Safety: Goal: Ability to remain free from injury will improve Outcome: Progressing Patient was educated on the importance of calling staff before getting up. Bed alarm, yellow socks, and fall risk band on.

## 2016-07-16 NOTE — Progress Notes (Signed)
[employer] [facility] [place] [facility]   PATIENT NAME: Eileen Walsh    MR#:  [id number]  DATE OF BIRTH:  [birth year]  SUBJECTIVE:   Here due to vague right upper quadrant abdominal pain associated some nausea and dizziness. Noted to be in atrial fibrillation but now converted to normal sinus rhythm. Family at bedside. Still having some nausea and vertigo this a.m.  REVIEW OF SYSTEMS:    Review of Systems  Constitutional: Negative for chills and fever.  HENT: Negative for congestion and tinnitus.   Eyes: Negative for blurred vision and double vision.  Respiratory: Negative for cough, shortness of breath and wheezing.   Cardiovascular: Negative for chest pain, orthopnea and PND.  Gastrointestinal: Positive for nausea. Negative for abdominal pain, diarrhea and vomiting.  Genitourinary: Negative for dysuria and hematuria.  Neurological: Positive for dizziness. Negative for sensory change and focal weakness.  All other systems reviewed and are negative.   Nutrition: Heart Healthy Tolerating Diet: Yes Tolerating PT: Eval noted.    DRUG ALLERGIES:   Allergies  Allergen Reactions  . Aspirin Other (See Comments)  . Sulfa Antibiotics Hives and Other (See Comments)    VITALS:  Blood pressure (!) 135/52, pulse 65, temperature 98.2 F (36.8 C), temperature source Oral, resp. rate 18, height 5\' 2"  (1.575 m), weight 86.7 kg (191 lb 1.6 oz), SpO2 95 %.  PHYSICAL EXAMINATION:   Physical Exam  GENERAL:  83 y.o.-year-old patient lying in the bed inno acute distress.  EYES: Pupils equal, round, reactive to light and accommodation. No scleral icterus. Extraocular muscles intact.  HEENT: Bruising around the left eye and forehead due to recent fall, normocephalic. Oropharynx and nasopharynx clear.  NECK:  Supple, no jugular venous distention. No thyroid enlargement, no tenderness.  LUNGS: Normal breath sounds bilaterally, no wheezing, rales, rhonchi. No use of accessory  muscles of respiration.  CARDIOVASCULAR: S1, S2 normal. No murmurs, rubs, or gallops.  ABDOMEN: Soft, nontender, nondistended. Bowel sounds present. No organomegaly or mass.  EXTREMITIES: No cyanosis, clubbing or edema b/l.    NEUROLOGIC: Cranial nerves II through XII are intact. No focal Motor or sensory deficits b/l.   PSYCHIATRIC: The patient is alert and oriented x 3.  SKIN: No obvious rash, lesion, or ulcer.    LABORATORY PANEL:   CBC  Recent Labs Lab 07/16/16 0619  WBC 18.6*  HGB 14.2  HCT 40.6  PLT 207   ------------------------------------------------------------------------------------------------------------------  Chemistries   Recent Labs Lab 07/16/16 0619  NA 137  K 3.8  CL 106  CO2 23  GLUCOSE 121*  BUN 13  CREATININE 0.81  CALCIUM 8.4*  MG 1.8  AST 255*  ALT 240*  ALKPHOS 82  BILITOT 5.0*   ------------------------------------------------------------------------------------------------------------------  Cardiac Enzymes  Recent Labs Lab 07/16/16 1011  TROPONINI 0.08*   ------------------------------------------------------------------------------------------------------------------  RADIOLOGY:  Dg Chest 1 View  Result Date: 07/15/2016 CLINICAL DATA:  New onset atrial fibrillation.  Fall. EXAM: CHEST 1 VIEW COMPARISON:  07/17/2013 FINDINGS: The heart is normal in size. Prominent hila, right greater than left. There is diffuse bronchial and interstitial thickening throughout both lungs. No confluent airspace disease. No pleural fluid or pneumothorax. No evidence of acute osseous abnormality. IMPRESSION: 1. Diffuse bronchial and interstitial thickening throughout both lungs, may be vascular congestion or bronchitic. 2. Normal heart size. Prominent hila, right greater than left. This may be enlarged pulmonary vessels, cannot exclude adenopathy. Chest CT with contrast would better defined. Electronically Signed   By: Lujean Rave.D.  On:  07/15/2016 22:35   Ct Head Wo Contrast  Result Date: 07/15/2016 CLINICAL DATA:  Larey SeatFell several hours ago while leaning over the side of the bed to vomit. Struck head on a side table. EXAM: CT HEAD WITHOUT CONTRAST CT CERVICAL SPINE WITHOUT CONTRAST TECHNIQUE: Multidetector CT imaging of the head and cervical spine was performed following the standard protocol without intravenous contrast. Multiplanar CT image reconstructions of the cervical spine were also generated. COMPARISON:  None. FINDINGS: CT HEAD FINDINGS Brain: There is no intracranial hemorrhage, mass or evidence of acute infarction. There is moderate generalized atrophy. There is moderate chronic microvascular ischemic change. There is no significant extra-axial fluid collection. No acute intracranial findings are evident. Vascular: No hyperdense vessel or unexpected calcification. Skull: Normal. Negative for fracture or focal lesion. Sinuses/Orbits: No acute finding. Other: Left frontal scalp hematoma CT CERVICAL SPINE FINDINGS Alignment: Normal. Skull base and vertebrae: No acute fracture. No primary bone lesion or focal pathologic process. Soft tissues and spinal canal: No prevertebral fluid or swelling. No visible canal hematoma. Disc levels: Moderate degenerative cervical disc disease, greatest at C6-7. Mild degenerative appearing anterolisthesis at C4-5. Facet articulations are moderately arthritic but intact. Upper chest: Negative. Other: None IMPRESSION: 1. No acute intracranial findings. There is moderate generalized atrophy and chronic appearing white matter hypodensities which likely represent small vessel ischemic disease. 2. Negative for acute cervical spine fracture Electronically Signed   By: Ellery Plunkaniel R Mitchell M.D.   On: 07/15/2016 23:46   Ct Cervical Spine Wo Contrast  Result Date: 07/15/2016 CLINICAL DATA:  Larey SeatFell several hours ago while leaning over the side of the bed to vomit. Struck head on a side table. EXAM: CT HEAD WITHOUT  CONTRAST CT CERVICAL SPINE WITHOUT CONTRAST TECHNIQUE: Multidetector CT imaging of the head and cervical spine was performed following the standard protocol without intravenous contrast. Multiplanar CT image reconstructions of the cervical spine were also generated. COMPARISON:  None. FINDINGS: CT HEAD FINDINGS Brain: There is no intracranial hemorrhage, mass or evidence of acute infarction. There is moderate generalized atrophy. There is moderate chronic microvascular ischemic change. There is no significant extra-axial fluid collection. No acute intracranial findings are evident. Vascular: No hyperdense vessel or unexpected calcification. Skull: Normal. Negative for fracture or focal lesion. Sinuses/Orbits: No acute finding. Other: Left frontal scalp hematoma CT CERVICAL SPINE FINDINGS Alignment: Normal. Skull base and vertebrae: No acute fracture. No primary bone lesion or focal pathologic process. Soft tissues and spinal canal: No prevertebral fluid or swelling. No visible canal hematoma. Disc levels: Moderate degenerative cervical disc disease, greatest at C6-7. Mild degenerative appearing anterolisthesis at C4-5. Facet articulations are moderately arthritic but intact. Upper chest: Negative. Other: None IMPRESSION: 1. No acute intracranial findings. There is moderate generalized atrophy and chronic appearing white matter hypodensities which likely represent small vessel ischemic disease. 2. Negative for acute cervical spine fracture Electronically Signed   By: Ellery Plunkaniel R Mitchell M.D.   On: 07/15/2016 23:46   Ct Angio Abd/pel W And/or Wo Contrast  Result Date: 07/16/2016 CLINICAL DATA:  Diffuse abdominal pain. Fall out of bed. New onset atrial fibrillation. EXAM: CTA ABDOMEN AND PELVIS wITHOUT AND WITH CONTRAST TECHNIQUE: Multidetector CT imaging of the abdomen and pelvis was performed using the standard protocol during bolus administration of intravenous contrast. Multiplanar reconstructed images and MIPs  were obtained and reviewed to evaluate the vascular anatomy. CONTRAST:  100 cc Isovue 370 IV COMPARISON:  None. FINDINGS: VASCULAR Aorta: Normal caliber aorta without aneurysm, dissection, vasculitis or significant stenosis. Moderate  atherosclerosis. Tiny accessory artery arises from the uppermost thoracic aorta superior to the celiac artery. Celiac: Patent without evidence of aneurysm, dissection, vasculitis or significant stenosis. Mild branch atherosclerosis. SMA: Patent without evidence of aneurysm, dissection, vasculitis or significant stenosis. Renals: Both renal arteries are patent without evidence of aneurysm, dissection, vasculitis, fibromuscular dysplasia or significant stenosis. IMA: Patent without evidence of aneurysm, dissection, vasculitis or significant stenosis. Inflow: Patent without evidence of aneurysm, dissection, vasculitis or significant stenosis. Proximal Outflow: Bilateral common femoral and visualized portions of the superficial and profunda femoral arteries are patent without evidence of aneurysm, dissection, vasculitis or significant stenosis. Veins: No acute abnormality of the a iliac, IVC, mesenteric or portal veins. Review of the MIP images confirms the above findings. NON-VASCULAR Lower chest: Minimal patchy ground-glass opacity in the dependent right lower lobe, with a possible solid component measuring 10 mm. No fractures of the included ribs. The heart is normal in size. Hepatobiliary: Scattered low-density lesions throughout the liver, largest in the right lobe measures 2.9 cm. Larger lesions are consistent with simple cysts, smaller lesions are too small to characterize but likely small cysts. No suspicious hepatic lesion. Gallbladder physiologically distended, no calcified stone. No biliary dilatation. Pancreas: No ductal dilatation or inflammation. Spleen: Normal in size without focal abnormality. Adrenals/Urinary Tract: A 5.0 x 3.2 cm well-defined structure in the left upper  quadrant appears discretely separate from the left adrenal gland. There is no definite adrenal nodule. No hydronephrosis or perinephric edema. Tiny cortical hypodensities in both kidneys, too small to characterize. Urinary bladder is physiologically distended. No bladder wall thickening. Stomach/Bowel: No stomach is decompressed. Descending and sigmoid colonic diverticulosis without acute inflammation. Normal appendix. No evidence of bowel wall thickening, distention or obstruction. Lymphatic: A few scattered mesenteric nodes in the left lower quadrant. No retroperitoneal adenopathy. No pelvic adenopathy. Reproductive: Post hysterectomy. Left ovary is quiescent. Mixed density structure in the right adnexa measures 4.5 x 3.6 cm, is heterogeneous and contains some internal calcifications, with both cystic and more soft tissue/solid components. Other: There is a well-defined ovoid structure in the left upper quadrant adjacent to the medial spleen and gastric cardia measuring 3.2 x 5.0 cm. Density suggests soft tissue or complex fluid, and etiology is indeterminate. Tiny fat containing umbilical hernia. No free air or free fluid in the abdomen or pelvis. Musculoskeletal: No acute fracture of the bony pelvis, lumbar spine or included ribs. Multilevel degenerative change in the spine. There are no acute or suspicious osseous abnormalities. IMPRESSION: VASCULAR 1. No acute vascular abnormality. 2. Moderate atherosclerosis. NON-VASCULAR 1. No acute finding in the abdomen or pelvis. A ground-glass opacity in the dependent right lower lobe is nonspecific, and may be infectious/inflammatory. There is a possible solid component. Follow-up non-contrast CT recommended at 3-6 months to confirm persistence. If unchanged, and solid component remains <6 mm, annual CT is recommended until 5 years of stability has been established. If persistent these nodules should be considered highly suspicious if the solid component of the nodule  is 6 mm or greater in size and enlarging. This recommendation follows the consensus statement: Guidelines for Management of Incidental Pulmonary Nodules Detected on CT Images: From the Fleischner Society 2017; Radiology 2017; 284:228-243. 2. Complex right adnexal mass, 4.5 cm greatest dimension. No prior exams for comparison, and patient is post hysterectomy. Prior exams would be most helpful if available, in the absence of prior imaging, recommend nonemergent ultrasound characterization. If not visualized sonographically and no prior exams are available, consider further evaluation with pelvic  MRI or surgical evaluation. 3. Left upper quadrant structure may be well-defined complex fluid or solid lesion, abutting the medial spleen and posterior stomach. This is indeterminate. Again, comparison with any prior imaging would be most helpful. This may be a lymphangioma, however exophytic gastric or splenic lesion is considered. 4. Colonic diverticulosis without diverticulitis. Incidental hepatic cysts. Electronically Signed   By: Rubye Oaks M.D.   On: 07/16/2016 00:17   US Abdomen Limited Ruq  Result Date: 07/16/2016 CLINICAL DATA:  Right upper quadrant pain. EXAM: US ABDOMEN LIMITED - RIGHT UPPER QUADRANT COMPARISON:  CT 2 hours prior. FINDINGS: Gallbladder: Physiologically distended. No gallstones or wall thickening visualized. No sonographic Murphy sign noted by sonographer. Common bile duct: Diameter: 5.7 mm Liver: Cyst in the right lobe measures 2.6 x 1.9 x 1.9 cm, cyst in the right inferior liver measures 3.1 x 3.0 x 3.8 cm. There is diffusely increased parenchymal echogenicity. Normal directional flow in the main portal vein. IMPRESSION: 1. Normal sonographic appearance of the gallbladder. No biliary dilatation. 2. Hepatic steatosis and cysts. Electronically Signed   By: Rubye Oaks M.D.   On: 07/16/2016 02:56     ASSESSMENT AND PLAN:   83 year old female with past medical history of  hypertension, fibromyalgia, osteoarthritis, macular degeneration who presents to the hospital due to abdominal pain and noted to be in new onset atrial fibrillation.  1. New onset atrial fibrillation-converted to normal sinus rhythm now. -Continue oral metoprolol. Await further cardiology input and await echocardiogram results. Clinically asymptomatic.  2. Right upper quadrant abdominal pain-etiology unclear. CT scan of the abdomen and pelvis showing no acute hepatobiliary pathology. LFTs are mildly elevated but are trending down. -Continue supportive care with pain control and IV fluids and antibiotics. Follow LFTs, if not improving consider gastroenterology consult.  3. Dizziness/nausea-suspected be secondary to vertigo. Continue meclizine, Valium -We'll check orthostatic vital signs. CT head and cervical spine negative for any acute pathology.  4. Pre-diabetes - BS table and cont. SSI.   5. GERD - cont. Protonix.   6. HTN - cont. Norvasc, Metoprolol, Avapro.    All the records are reviewed and case discussed with Care Management/Social Worker. Management plans discussed with the patient, family and they are in agreement.  CODE STATUS: Full code  DVT Prophylaxis: Lovenox  TOTAL TIME TAKING CARE OF THIS PATIENT: 30 minutes.   POSSIBLE D/C IN 1-2 DAYS, DEPENDING ON CLINICAL CONDITION.   Houston Siren M.D on 07/16/2016 at 3:49 PM  Between 7am to 6pm - Pager - [phone]  After 6pm go to [email or web address] - Social research officer, government  [facility] [place] [facility]  Office  [phone]  CC: Primary care physician; [name], MD

## 2016-07-16 NOTE — Care Management Obs Status (Signed)
MEDICARE OBSERVATION STATUS NOTIFICATION   Patient Details  Name: Eileen Walsh MRN: [id number] Date of Birth: 03-May-1929   Medicare Observation Status Notification Given:  Yes    Eber HongGreene, Juston Goheen R, RN 07/16/2016, 8:29 PM

## 2016-07-16 NOTE — ED Notes (Signed)
Pt complaining of headache.MD made aware.

## 2016-07-16 NOTE — Consult Note (Signed)
[facility] Cardiology  CARDIOLOGY CONSULT NOTE  Patient ID: Eileen Walsh MRN: [id number] DOB/AGE: 81/21/1930 81 y.o.  Admit date: 07/15/2016 Referring Physician Cherlynn Kaiser Primary Physician Sullivan Lone Primary Cardiologist Gwen Pounds Reason for Consultation New onset atrial fibrillation with RVR  HPI: 83 year old female referred for new-onset atrial fibrillation with RVR. She has a history of CAD by cardiac cath years ago, hypertension, and hyperlipidemia. 2D echocardiogram 01/21/2016 revealed normal left ventricular function. Nuclear stress test was negative for ischemia. Patient states she acutely began to feel poorly overall while she was out shopping, went to bed, felt like she was going to vomit, and fell out of her bed when she tried to vomit. She hit her head on the bedside table. She presented to [facility] and was found to be in new onset atrial fibrillation with RVR at a rate of 139 bpm with nonspecific ST-T wave changes, treated with IV cardizem, and started on Lovenox. Admission labs notable for troponin 0.03 and WBC 19.1. Head CT negative for acute intracranial findings or acute cervical spine fracture. She denies experiencing chest pain, shortness of breath, palpitations, heart racing, or lower extremity edema. She complains of dizziness upon standing.   Review of systems complete and found to be negative unless listed above     Past Medical History:  Diagnosis Date  . Arthritis   . Fibromyalgia   . Hypertension   . Macular degeneration     Past Surgical History:  Procedure Laterality Date  . ABDOMINAL HYSTERECTOMY  1970  . BREAST SURGERY  1985   Breast Bx    Prescriptions Prior to Admission  Medication Sig Dispense Refill Last Dose  . amLODipine-olmesartan (AZOR) 5-40 MG tablet TAKE 1 TABLET DAILY 90 tablet 3 07/15/2016 at Unknown time  . Multiple Vitamin (MULTIVITAMIN WITH MINERALS) TABS tablet Take 1 tablet by mouth daily.   07/15/2016 at Unknown time  . omeprazole (PRILOSEC) 20 MG  capsule TAKE 1 CAPSULE IN THE MORNING 90 capsule 3 07/15/2016 at Unknown time   Social History   Social History  . Marital status: Widowed    Spouse name: N/A  . Number of children: N/A  . Years of education: N/A   Occupational History  . Not on file.   Social History Main Topics  . Smoking status: Never Smoker  . Smokeless tobacco: Never Used  . Alcohol use 0.6 oz/week    1 Glasses of wine per week  . Drug use: No  . Sexual activity: Not on file   Other Topics Concern  . Not on file   Social History Narrative  . No narrative on file    Family History  Problem Relation Age of Onset  . Hypertension Mother   . Arthritis Mother   . Hyperlipidemia Mother   . Heart disease Father   . Hypertension Brother   . Stroke Brother   . Diabetes Brother   . Arthritis Brother   . Heart disease Brother   . Anxiety disorder Brother     severe. had nervous breakdown      Review of systems complete and found to be negative unless listed above      PHYSICAL EXAM  General: Well developed, well nourished, in no acute distress HEENT:  Normocephalic and atramatic Neck:  No JVD.  Lungs: Clear bilaterally to auscultation and percussion. Heart: HRRR . Normal S1 and S2 without gallops or murmurs.  Abdomen: Bowel sounds are positive, abdomen soft Msk:  Patient lying in bed. Able to sit upright in  bed with assistance. Extremities: No clubbing, cyanosis or edema.   Neuro: Alert and oriented X 3. Psych:  Good affect, responds appropriately  Labs:   Lab Results  Component Value Date   WBC 18.6 (H) 07/16/2016   HGB 14.2 07/16/2016   HCT 40.6 07/16/2016   MCV 91.5 07/16/2016   PLT 207 07/16/2016    Recent Labs Lab 07/16/16 0619  NA 137  K 3.8  CL 106  CO2 23  BUN 13  CREATININE 0.81  CALCIUM 8.4*  PROT 6.4*  BILITOT 5.0*  ALKPHOS 82  ALT 240*  AST 255*  GLUCOSE 121*   Lab Results  Component Value Date   CKTOTAL 111 12/22/2012   CKMB 0.8 12/22/2012   TROPONINI  0.09 (HH) 07/16/2016    Lab Results  Component Value Date   CHOL 186 07/16/2016   CHOL 268 (H) 06/24/2016   CHOL 269 (A) 11/15/2013   Lab Results  Component Value Date   HDL 44 07/16/2016   HDL 46 06/24/2016   HDL 39 11/15/2013   Lab Results  Component Value Date   LDLCALC 133 (H) 07/16/2016   LDLCALC 186 (H) 06/24/2016   LDLCALC 197 11/15/2013   Lab Results  Component Value Date   TRIG 45 07/16/2016   TRIG 179 (H) 06/24/2016   TRIG 164 (A) 11/15/2013   Lab Results  Component Value Date   CHOLHDL 4.2 07/16/2016   No results found for: LDLDIRECT    Radiology: Dg Chest 1 View  Result Date: 07/15/2016 CLINICAL DATA:  New onset atrial fibrillation.  Fall. EXAM: CHEST 1 VIEW COMPARISON:  07/17/2013 FINDINGS: The heart is normal in size. Prominent hila, right greater than left. There is diffuse bronchial and interstitial thickening throughout both lungs. No confluent airspace disease. No pleural fluid or pneumothorax. No evidence of acute osseous abnormality. IMPRESSION: 1. Diffuse bronchial and interstitial thickening throughout both lungs, may be vascular congestion or bronchitic. 2. Normal heart size. Prominent hila, right greater than left. This may be enlarged pulmonary vessels, cannot exclude adenopathy. Chest CT with contrast would better defined. Electronically Signed   By: Rubye Oaks M.D.   On: 07/15/2016 22:35   Ct Head Wo Contrast  Result Date: 07/15/2016 CLINICAL DATA:  Larey Seat several hours ago while leaning over the side of the bed to vomit. Struck head on a side table. EXAM: CT HEAD WITHOUT CONTRAST CT CERVICAL SPINE WITHOUT CONTRAST TECHNIQUE: Multidetector CT imaging of the head and cervical spine was performed following the standard protocol without intravenous contrast. Multiplanar CT image reconstructions of the cervical spine were also generated. COMPARISON:  None. FINDINGS: CT HEAD FINDINGS Brain: There is no intracranial hemorrhage, mass or evidence of acute  infarction. There is moderate generalized atrophy. There is moderate chronic microvascular ischemic change. There is no significant extra-axial fluid collection. No acute intracranial findings are evident. Vascular: No hyperdense vessel or unexpected calcification. Skull: Normal. Negative for fracture or focal lesion. Sinuses/Orbits: No acute finding. Other: Left frontal scalp hematoma CT CERVICAL SPINE FINDINGS Alignment: Normal. Skull base and vertebrae: No acute fracture. No primary bone lesion or focal pathologic process. Soft tissues and spinal canal: No prevertebral fluid or swelling. No visible canal hematoma. Disc levels: Moderate degenerative cervical disc disease, greatest at C6-7. Mild degenerative appearing anterolisthesis at C4-5. Facet articulations are moderately arthritic but intact. Upper chest: Negative. Other: None IMPRESSION: 1. No acute intracranial findings. There is moderate generalized atrophy and chronic appearing white matter hypodensities which likely represent small vessel ischemic  disease. 2. Negative for acute cervical spine fracture Electronically Signed   By: Ellery Plunk M.D.   On: 07/15/2016 23:46   Ct Cervical Spine Wo Contrast  Result Date: 07/15/2016 CLINICAL DATA:  Larey Seat several hours ago while leaning over the side of the bed to vomit. Struck head on a side table. EXAM: CT HEAD WITHOUT CONTRAST CT CERVICAL SPINE WITHOUT CONTRAST TECHNIQUE: Multidetector CT imaging of the head and cervical spine was performed following the standard protocol without intravenous contrast. Multiplanar CT image reconstructions of the cervical spine were also generated. COMPARISON:  None. FINDINGS: CT HEAD FINDINGS Brain: There is no intracranial hemorrhage, mass or evidence of acute infarction. There is moderate generalized atrophy. There is moderate chronic microvascular ischemic change. There is no significant extra-axial fluid collection. No acute intracranial findings are evident.  Vascular: No hyperdense vessel or unexpected calcification. Skull: Normal. Negative for fracture or focal lesion. Sinuses/Orbits: No acute finding. Other: Left frontal scalp hematoma CT CERVICAL SPINE FINDINGS Alignment: Normal. Skull base and vertebrae: No acute fracture. No primary bone lesion or focal pathologic process. Soft tissues and spinal canal: No prevertebral fluid or swelling. No visible canal hematoma. Disc levels: Moderate degenerative cervical disc disease, greatest at C6-7. Mild degenerative appearing anterolisthesis at C4-5. Facet articulations are moderately arthritic but intact. Upper chest: Negative. Other: None IMPRESSION: 1. No acute intracranial findings. There is moderate generalized atrophy and chronic appearing white matter hypodensities which likely represent small vessel ischemic disease. 2. Negative for acute cervical spine fracture Electronically Signed   By: Ellery Plunk M.D.   On: 07/15/2016 23:46   Ct Angio Abd/pel W And/or Wo Contrast  Result Date: 07/16/2016 CLINICAL DATA:  Diffuse abdominal pain. Fall out of bed. New onset atrial fibrillation. EXAM: CTA ABDOMEN AND PELVIS wITHOUT AND WITH CONTRAST TECHNIQUE: Multidetector CT imaging of the abdomen and pelvis was performed using the standard protocol during bolus administration of intravenous contrast. Multiplanar reconstructed images and MIPs were obtained and reviewed to evaluate the vascular anatomy. CONTRAST:  100 cc Isovue 370 IV COMPARISON:  None. FINDINGS: VASCULAR Aorta: Normal caliber aorta without aneurysm, dissection, vasculitis or significant stenosis. Moderate atherosclerosis. Tiny accessory artery arises from the uppermost thoracic aorta superior to the celiac artery. Celiac: Patent without evidence of aneurysm, dissection, vasculitis or significant stenosis. Mild branch atherosclerosis. SMA: Patent without evidence of aneurysm, dissection, vasculitis or significant stenosis. Renals: Both renal arteries are  patent without evidence of aneurysm, dissection, vasculitis, fibromuscular dysplasia or significant stenosis. IMA: Patent without evidence of aneurysm, dissection, vasculitis or significant stenosis. Inflow: Patent without evidence of aneurysm, dissection, vasculitis or significant stenosis. Proximal Outflow: Bilateral common femoral and visualized portions of the superficial and profunda femoral arteries are patent without evidence of aneurysm, dissection, vasculitis or significant stenosis. Veins: No acute abnormality of the a iliac, IVC, mesenteric or portal veins. Review of the MIP images confirms the above findings. NON-VASCULAR Lower chest: Minimal patchy ground-glass opacity in the dependent right lower lobe, with a possible solid component measuring 10 mm. No fractures of the included ribs. The heart is normal in size. Hepatobiliary: Scattered low-density lesions throughout the liver, largest in the right lobe measures 2.9 cm. Larger lesions are consistent with simple cysts, smaller lesions are too small to characterize but likely small cysts. No suspicious hepatic lesion. Gallbladder physiologically distended, no calcified stone. No biliary dilatation. Pancreas: No ductal dilatation or inflammation. Spleen: Normal in size without focal abnormality. Adrenals/Urinary Tract: A 5.0 x 3.2 cm well-defined structure in the left  upper quadrant appears discretely separate from the left adrenal gland. There is no definite adrenal nodule. No hydronephrosis or perinephric edema. Tiny cortical hypodensities in both kidneys, too small to characterize. Urinary bladder is physiologically distended. No bladder wall thickening. Stomach/Bowel: No stomach is decompressed. Descending and sigmoid colonic diverticulosis without acute inflammation. Normal appendix. No evidence of bowel wall thickening, distention or obstruction. Lymphatic: A few scattered mesenteric nodes in the left lower quadrant. No retroperitoneal adenopathy.  No pelvic adenopathy. Reproductive: Post hysterectomy. Left ovary is quiescent. Mixed density structure in the right adnexa measures 4.5 x 3.6 cm, is heterogeneous and contains some internal calcifications, with both cystic and more soft tissue/solid components. Other: There is a well-defined ovoid structure in the left upper quadrant adjacent to the medial spleen and gastric cardia measuring 3.2 x 5.0 cm. Density suggests soft tissue or complex fluid, and etiology is indeterminate. Tiny fat containing umbilical hernia. No free air or free fluid in the abdomen or pelvis. Musculoskeletal: No acute fracture of the bony pelvis, lumbar spine or included ribs. Multilevel degenerative change in the spine. There are no acute or suspicious osseous abnormalities. IMPRESSION: VASCULAR 1. No acute vascular abnormality. 2. Moderate atherosclerosis. NON-VASCULAR 1. No acute finding in the abdomen or pelvis. A ground-glass opacity in the dependent right lower lobe is nonspecific, and may be infectious/inflammatory. There is a possible solid component. Follow-up non-contrast CT recommended at 3-6 months to confirm persistence. If unchanged, and solid component remains <6 mm, annual CT is recommended until 5 years of stability has been established. If persistent these nodules should be considered highly suspicious if the solid component of the nodule is 6 mm or greater in size and enlarging. This recommendation follows the consensus statement: Guidelines for Management of Incidental Pulmonary Nodules Detected on CT Images: From the Fleischner Society 2017; Radiology 2017; 284:228-243. 2. Complex right adnexal mass, 4.5 cm greatest dimension. No prior exams for comparison, and patient is post hysterectomy. Prior exams would be most helpful if available, in the absence of prior imaging, recommend nonemergent ultrasound characterization. If not visualized sonographically and no prior exams are available, consider further evaluation  with pelvic MRI or surgical evaluation. 3. Left upper quadrant structure may be well-defined complex fluid or solid lesion, abutting the medial spleen and posterior stomach. This is indeterminate. Again, comparison with any prior imaging would be most helpful. This may be a lymphangioma, however exophytic gastric or splenic lesion is considered. 4. Colonic diverticulosis without diverticulitis. Incidental hepatic cysts. Electronically Signed   By: Rubye OaksMelanie  Ehinger M.D.   On: 07/16/2016 00:17   [place] Abdomen Limited Ruq  Result Date: 07/16/2016 CLINICAL DATA:  Right upper quadrant pain. EXAM: US ABDOMEN LIMITED - RIGHT UPPER QUADRANT COMPARISON:  CT 2 hours prior. FINDINGS: Gallbladder: Physiologically distended. No gallstones or wall thickening visualized. No sonographic Murphy sign noted by sonographer. Common bile duct: Diameter: 5.7 mm Liver: Cyst in the right lobe measures 2.6 x 1.9 x 1.9 cm, cyst in the right inferior liver measures 3.1 x 3.0 x 3.8 cm. There is diffusely increased parenchymal echogenicity. Normal directional flow in the main portal vein. IMPRESSION: 1. Normal sonographic appearance of the gallbladder. No biliary dilatation. 2. Hepatic steatosis and cysts. Electronically Signed   By: Rubye OaksMelanie  Ehinger M.D.   On: 07/16/2016 02:56    EKG: Sinus rhythm, rate 66 bpm  ASSESSMENT AND PLAN:  1. New onset atrial fibrillation with RVR, now rate and rhythm controlled, in the setting of acute abdominal pain and leukocytosis  of uncertain etiology.  Recommendations 1. Agree with current therapy. 2. Continue metoprolol tartrate for now. 3. Review echocardiogram 4. Further recommendations pending patient's initial clinical course including decision about chronic anticoagulation.   Signed: Leanora Ivanoff, PA-C 07/16/2016, 10:38 AM

## 2016-07-16 NOTE — Progress Notes (Signed)
ANTICOAGULATION CONSULT NOTE - Initial Consult  Pharmacy Consult for enoxaparin Indication: AF  Allergies  Allergen Reactions  . Aspirin Other (See Comments)  . Sulfa Antibiotics Hives and Other (See Comments)    Patient Measurements: Height: 5\' 2"  (157.5 cm) Weight: 192 lb (87.1 kg) IBW/kg (Calculated) : 50.1 Heparin Dosing Weight:   Vital Signs: Temp: 97.8 F (36.6 C) (02/01 0458) Temp Source: Oral (02/01 0458) BP: 127/62 (02/01 0458) Pulse Rate: 85 (02/01 0458)  Labs:  Recent Labs  07/15/16 2202 07/16/16 0414  HGB 15.8  --   HCT 45.6  --   PLT 197  --   APTT 29  --   LABPROT 13.6  --   INR 1.04  --   CREATININE 0.89  --   TROPONINI 0.03* 0.06*    Estimated Creatinine Clearance: 47.3 mL/min (by C-G formula based on SCr of 0.89 mg/dL).   Medical History: Past Medical History:  Diagnosis Date  . Arthritis   . Fibromyalgia   . Hypertension   . Macular degeneration     Medications:  Infusions:  . sodium chloride      Assessment: 87 yof cc nausea, sudden onset right upper quadrant abdominal pain with dizziness. Dx new onset AF, pharmacy consulted for therapeutic enoxaparin dosing. No PTA AC listed.   Goal of Therapy:  Anti-Xa level 0.6-1 units/ml 4hrs after LMWH dose given Monitor platelets by anticoagulation protocol: Yes   Plan:  Lovenox 85 mg subcutaneously twice daily  Carola FrostNathan A Kymani Shimabukuro, Pharm.D., BCPS Clinical Pharmacist 07/16/2016,5:08 AM

## 2016-07-17 LAB — URINALYSIS, ROUTINE W REFLEX MICROSCOPIC
BACTERIA UA: NONE SEEN
BILIRUBIN URINE: NEGATIVE
Glucose, UA: 50 mg/dL — AB
KETONES UR: 5 mg/dL — AB
LEUKOCYTES UA: NEGATIVE
Nitrite: NEGATIVE
PH: 6 (ref 5.0–8.0)
Protein, ur: NEGATIVE mg/dL
SPECIFIC GRAVITY, URINE: 1.01 (ref 1.005–1.030)

## 2016-07-17 LAB — COMPREHENSIVE METABOLIC PANEL
ALBUMIN: 3.2 g/dL — AB (ref 3.5–5.0)
ALK PHOS: 85 U/L (ref 38–126)
ALT: 188 U/L — ABNORMAL HIGH (ref 14–54)
ANION GAP: 6 (ref 5–15)
AST: 140 U/L — ABNORMAL HIGH (ref 15–41)
BUN: 11 mg/dL (ref 6–20)
CALCIUM: 8.2 mg/dL — AB (ref 8.9–10.3)
CO2: 23 mmol/L (ref 22–32)
Chloride: 107 mmol/L (ref 101–111)
Creatinine, Ser: 0.82 mg/dL (ref 0.44–1.00)
GFR calc non Af Amer: >60 mL/min (ref >60)
Glucose, Bld: 126 mg/dL — ABNORMAL HIGH (ref 65–99)
POTASSIUM: 3.6 mmol/L (ref 3.5–5.1)
SODIUM: 136 mmol/L (ref 135–145)
TOTAL PROTEIN: 6.3 g/dL — AB (ref 6.5–8.1)
Total Bilirubin: 3.7 mg/dL — ABNORMAL HIGH (ref 0.3–1.2)

## 2016-07-17 LAB — HEPATITIS PANEL, ACUTE
HEP A IGM: NEGATIVE
HEP B S AG: NEGATIVE
Hep B C IgM: NEGATIVE

## 2016-07-17 LAB — HEMOGLOBIN A1C
HEMOGLOBIN A1C: 5.5 % (ref 4.8–5.6)
MEAN PLASMA GLUCOSE: 111 mg/dL

## 2016-07-17 LAB — GLUCOSE, CAPILLARY
GLUCOSE-CAPILLARY: 112 mg/dL — AB (ref 65–99)
Glucose-Capillary: 143 mg/dL — ABNORMAL HIGH (ref 65–99)

## 2016-07-17 LAB — ECHOCARDIOGRAM COMPLETE
Height: 62 in
Weight: 3057.6 oz

## 2016-07-17 MED ORDER — ACETAMINOPHEN 325 MG PO TABS
650.0000 mg | ORAL_TABLET | Freq: Four times a day (QID) | ORAL | Status: DC | PRN
  Administered 2016-07-17: 650 mg via ORAL
  Filled 2016-07-17: qty 2

## 2016-07-17 MED ORDER — METOPROLOL TARTRATE 25 MG PO TABS: 12.5000 mg | ORAL_TABLET | Freq: Two times a day (BID) | ORAL | 1 refills | Status: DC

## 2016-07-17 NOTE — Care Management (Addendum)
Patient for discharge home today with home health physical therapy.  Patient in agreement.  Confirmed her contact information.  [facility] is in network with insurance and can accept patient.  has a walker

## 2016-07-17 NOTE — Progress Notes (Signed)
[facility]  SUBJECTIVE: Patient states she feels better this morning. She was able to ambulate with her walker this morning and denies experiencing dizziness. She denies shortness of breath, chest pain, or palpitations. She states her nausea has also improved.    Vitals:   07/17/16 0343 07/17/16 0408 07/17/16 0911 07/17/16 0913  BP: (!) 169/63 (!) 158/71 (!) 165/59 (!) 170/68  Pulse: 77 77 78 76  Resp: 18  18   Temp: 98.6 F (37 C)  98.4 F (36.9 C)   TempSrc: Oral  Oral   SpO2: 91%  92% 91%  Weight:      Height:         Intake/Output Summary (Last 24 hours) at 07/17/16 1610 Last data filed at 07/17/16 9604  Gross per 24 hour  Intake           1807.5 ml  Output              750 ml  Net           1057.5 ml      PHYSICAL EXAM  General: Well developed, well nourished, in no acute distress HEENT:  Normocephalic and atramatic Neck:  No JVD.  Lungs: Clear bilaterally to auscultation Heart: HRRR . Normal S1 and S2 without gallops or murmurs.  Abdomen: Bowel sounds are positive, abdomen soft and non-tender  Msk:  Back normal. Able to walk slowly with use of walker.  Extremities: No clubbing, cyanosis or edema.   Neuro: Alert and oriented X 3. Psych:  Good affect, responds appropriately   LABS: Basic Metabolic Panel:  Recent Labs  54/09/81 0619 07/17/16 0634  NA 137 136  K 3.8 3.6  CL 106 107  CO2 23 23  GLUCOSE 121* 126*  BUN 13 11  CREATININE 0.81 0.82  CALCIUM 8.4* 8.2*  MG 1.8  --   PHOS 2.8  --    Liver Function Tests:  Recent Labs  07/16/16 0619 07/17/16 0634  AST 255* 140*  ALT 240* 188*  ALKPHOS 82 85  BILITOT 5.0* 3.7*  PROT 6.4* 6.3*  ALBUMIN 3.5 3.2*    Recent Labs  07/15/16 2202  LIPASE 31   CBC:  Recent Labs  07/15/16 2202 07/16/16 0619  WBC 19.1* 18.6*  NEUTROABS 17.5*  --   HGB 15.8 14.2  HCT 45.6 40.6  MCV 90.7 91.5  PLT 197 207   Cardiac Enzymes:  Recent Labs  07/16/16 0619 07/16/16 1011 07/16/16 1654   TROPONINI 0.09* 0.08* 0.06*   BNP: Invalid input(s): POCBNP D-Dimer: No results for input(s): DDIMER in the last 72 hours. Hemoglobin A1C:  Recent Labs  07/16/16 0619  HGBA1C 5.5   Fasting Lipid Panel:  Recent Labs  07/16/16 0619  CHOL 186  HDL 44  LDLCALC 133*  TRIG 45  CHOLHDL 4.2   Thyroid Function Tests:  Recent Labs  07/16/16 0619  TSH 1.866   Anemia Panel: No results for input(s): VITAMINB12, FOLATE, FERRITIN, TIBC, IRON, RETICCTPCT in the last 72 hours.  Dg Chest 1 View  Result Date: 07/15/2016 CLINICAL DATA:  New onset atrial fibrillation.  Fall. EXAM: CHEST 1 VIEW COMPARISON:  07/17/2013 FINDINGS: The heart is normal in size. Prominent hila, right greater than left. There is diffuse bronchial and interstitial thickening throughout both lungs. No confluent airspace disease. No pleural fluid or pneumothorax. No evidence of acute osseous abnormality. IMPRESSION: 1. Diffuse bronchial and interstitial thickening throughout both lungs, may be vascular congestion or bronchitic. 2. Normal heart  size. Prominent hila, right greater than left. This may be enlarged pulmonary vessels, cannot exclude adenopathy. Chest CT with contrast would better defined. Electronically Signed   By: Rubye Oaks M.D.   On: 07/15/2016 22:35   Ct Head Wo Contrast  Result Date: 07/15/2016 CLINICAL DATA:  Larey Seat several hours ago while leaning over the side of the bed to vomit. Struck head on a side table. EXAM: CT HEAD WITHOUT CONTRAST CT CERVICAL SPINE WITHOUT CONTRAST TECHNIQUE: Multidetector CT imaging of the head and cervical spine was performed following the standard protocol without intravenous contrast. Multiplanar CT image reconstructions of the cervical spine were also generated. COMPARISON:  None. FINDINGS: CT HEAD FINDINGS Brain: There is no intracranial hemorrhage, mass or evidence of acute infarction. There is moderate generalized atrophy. There is moderate chronic microvascular  ischemic change. There is no significant extra-axial fluid collection. No acute intracranial findings are evident. Vascular: No hyperdense vessel or unexpected calcification. Skull: Normal. Negative for fracture or focal lesion. Sinuses/Orbits: No acute finding. Other: Left frontal scalp hematoma CT CERVICAL SPINE FINDINGS Alignment: Normal. Skull base and vertebrae: No acute fracture. No primary bone lesion or focal pathologic process. Soft tissues and spinal canal: No prevertebral fluid or swelling. No visible canal hematoma. Disc levels: Moderate degenerative cervical disc disease, greatest at C6-7. Mild degenerative appearing anterolisthesis at C4-5. Facet articulations are moderately arthritic but intact. Upper chest: Negative. Other: None IMPRESSION: 1. No acute intracranial findings. There is moderate generalized atrophy and chronic appearing white matter hypodensities which likely represent small vessel ischemic disease. 2. Negative for acute cervical spine fracture Electronically Signed   By: Ellery Plunk M.D.   On: 07/15/2016 23:46   Ct Cervical Spine Wo Contrast  Result Date: 07/15/2016 CLINICAL DATA:  Larey Seat several hours ago while leaning over the side of the bed to vomit. Struck head on a side table. EXAM: CT HEAD WITHOUT CONTRAST CT CERVICAL SPINE WITHOUT CONTRAST TECHNIQUE: Multidetector CT imaging of the head and cervical spine was performed following the standard protocol without intravenous contrast. Multiplanar CT image reconstructions of the cervical spine were also generated. COMPARISON:  None. FINDINGS: CT HEAD FINDINGS Brain: There is no intracranial hemorrhage, mass or evidence of acute infarction. There is moderate generalized atrophy. There is moderate chronic microvascular ischemic change. There is no significant extra-axial fluid collection. No acute intracranial findings are evident. Vascular: No hyperdense vessel or unexpected calcification. Skull: Normal. Negative for fracture  or focal lesion. Sinuses/Orbits: No acute finding. Other: Left frontal scalp hematoma CT CERVICAL SPINE FINDINGS Alignment: Normal. Skull base and vertebrae: No acute fracture. No primary bone lesion or focal pathologic process. Soft tissues and spinal canal: No prevertebral fluid or swelling. No visible canal hematoma. Disc levels: Moderate degenerative cervical disc disease, greatest at C6-7. Mild degenerative appearing anterolisthesis at C4-5. Facet articulations are moderately arthritic but intact. Upper chest: Negative. Other: None IMPRESSION: 1. No acute intracranial findings. There is moderate generalized atrophy and chronic appearing white matter hypodensities which likely represent small vessel ischemic disease. 2. Negative for acute cervical spine fracture Electronically Signed   By: Ellery Plunk M.D.   On: 07/15/2016 23:46   Ct Angio Abd/pel W And/or Wo Contrast  Result Date: 07/16/2016 CLINICAL DATA:  Diffuse abdominal pain. Fall out of bed. New onset atrial fibrillation. EXAM: CTA ABDOMEN AND PELVIS wITHOUT AND WITH CONTRAST TECHNIQUE: Multidetector CT imaging of the abdomen and pelvis was performed using the standard protocol during bolus administration of intravenous contrast. Multiplanar reconstructed images and MIPs  were obtained and reviewed to evaluate the vascular anatomy. CONTRAST:  100 cc Isovue 370 IV COMPARISON:  None. FINDINGS: VASCULAR Aorta: Normal caliber aorta without aneurysm, dissection, vasculitis or significant stenosis. Moderate atherosclerosis. Tiny accessory artery arises from the uppermost thoracic aorta superior to the celiac artery. Celiac: Patent without evidence of aneurysm, dissection, vasculitis or significant stenosis. Mild branch atherosclerosis. SMA: Patent without evidence of aneurysm, dissection, vasculitis or significant stenosis. Renals: Both renal arteries are patent without evidence of aneurysm, dissection, vasculitis, fibromuscular dysplasia or significant  stenosis. IMA: Patent without evidence of aneurysm, dissection, vasculitis or significant stenosis. Inflow: Patent without evidence of aneurysm, dissection, vasculitis or significant stenosis. Proximal Outflow: Bilateral common femoral and visualized portions of the superficial and profunda femoral arteries are patent without evidence of aneurysm, dissection, vasculitis or significant stenosis. Veins: No acute abnormality of the a iliac, IVC, mesenteric or portal veins. Review of the MIP images confirms the above findings. NON-VASCULAR Lower chest: Minimal patchy ground-glass opacity in the dependent right lower lobe, with a possible solid component measuring 10 mm. No fractures of the included ribs. The heart is normal in size. Hepatobiliary: Scattered low-density lesions throughout the liver, largest in the right lobe measures 2.9 cm. Larger lesions are consistent with simple cysts, smaller lesions are too small to characterize but likely small cysts. No suspicious hepatic lesion. Gallbladder physiologically distended, no calcified stone. No biliary dilatation. Pancreas: No ductal dilatation or inflammation. Spleen: Normal in size without focal abnormality. Adrenals/Urinary Tract: A 5.0 x 3.2 cm well-defined structure in the left upper quadrant appears discretely separate from the left adrenal gland. There is no definite adrenal nodule. No hydronephrosis or perinephric edema. Tiny cortical hypodensities in both kidneys, too small to characterize. Urinary bladder is physiologically distended. No bladder wall thickening. Stomach/Bowel: No stomach is decompressed. Descending and sigmoid colonic diverticulosis without acute inflammation. Normal appendix. No evidence of bowel wall thickening, distention or obstruction. Lymphatic: A few scattered mesenteric nodes in the left lower quadrant. No retroperitoneal adenopathy. No pelvic adenopathy. Reproductive: Post hysterectomy. Left ovary is quiescent. Mixed density  structure in the right adnexa measures 4.5 x 3.6 cm, is heterogeneous and contains some internal calcifications, with both cystic and more soft tissue/solid components. Other: There is a well-defined ovoid structure in the left upper quadrant adjacent to the medial spleen and gastric cardia measuring 3.2 x 5.0 cm. Density suggests soft tissue or complex fluid, and etiology is indeterminate. Tiny fat containing umbilical hernia. No free air or free fluid in the abdomen or pelvis. Musculoskeletal: No acute fracture of the bony pelvis, lumbar spine or included ribs. Multilevel degenerative change in the spine. There are no acute or suspicious osseous abnormalities. IMPRESSION: VASCULAR 1. No acute vascular abnormality. 2. Moderate atherosclerosis. NON-VASCULAR 1. No acute finding in the abdomen or pelvis. A ground-glass opacity in the dependent right lower lobe is nonspecific, and may be infectious/inflammatory. There is a possible solid component. Follow-up non-contrast CT recommended at 3-6 months to confirm persistence. If unchanged, and solid component remains <6 mm, annual CT is recommended until 5 years of stability has been established. If persistent these nodules should be considered highly suspicious if the solid component of the nodule is 6 mm or greater in size and enlarging. This recommendation follows the consensus statement: Guidelines for Management of Incidental Pulmonary Nodules Detected on CT Images: From the Fleischner Society 2017; Radiology 2017; 284:228-243. 2. Complex right adnexal mass, 4.5 cm greatest dimension. No prior exams for comparison, and patient is post hysterectomy.  Prior exams would be most helpful if available, in the absence of prior imaging, recommend nonemergent ultrasound characterization. If not visualized sonographically and no prior exams are available, consider further evaluation with pelvic MRI or surgical evaluation. 3. Left upper quadrant structure may be well-defined  complex fluid or solid lesion, abutting the medial spleen and posterior stomach. This is indeterminate. Again, comparison with any prior imaging would be most helpful. This may be a lymphangioma, however exophytic gastric or splenic lesion is considered. 4. Colonic diverticulosis without diverticulitis. Incidental hepatic cysts. Electronically Signed   By: Rubye OaksMelanie  Ehinger M.D.   On: 07/16/2016 00:17   [place] Abdomen Limited Ruq  Result Date: 07/16/2016 CLINICAL DATA:  Right upper quadrant pain. EXAM: US ABDOMEN LIMITED - RIGHT UPPER QUADRANT COMPARISON:  CT 2 hours prior. FINDINGS: Gallbladder: Physiologically distended. No gallstones or wall thickening visualized. No sonographic Murphy sign noted by sonographer. Common bile duct: Diameter: 5.7 mm Liver: Cyst in the right lobe measures 2.6 x 1.9 x 1.9 cm, cyst in the right inferior liver measures 3.1 x 3.0 x 3.8 cm. There is diffusely increased parenchymal echogenicity. Normal directional flow in the main portal vein. IMPRESSION: 1. Normal sonographic appearance of the gallbladder. No biliary dilatation. 2. Hepatic steatosis and cysts. Electronically Signed   By: Rubye OaksMelanie  Ehinger M.D.   On: 07/16/2016 02:56     Echo: Pending  TELEMETRY: Normal sinus rhythm, rate 66 bpm  ASSESSMENT AND PLAN:  Active Problems:   Atrial fibrillation with RVR (HCC)    1. New onset atrial fibrillation with RVR, now in sinus rhythm, in the setting of acute abdominal pain and leukocytosis. Patient denies chest pain or palpitations, dizziness improving.  Recommendations 1. Agree with current therapy. 2. Continue metoprolol tartrate  3. Review results of echocardiogram 4. Defer full dose anticoagulation at this time.  5. Pending results of echo, patient can follow-up as outpatient to further assess and evaluate atrial fibrillation after resolution of her acute illness, as well as discuss the decision to commit to chronic anticoagulation.   Eileen Ivanoffnna Culver Feighner,  PA-C 07/17/2016 9:29 AM

## 2016-07-17 NOTE — Progress Notes (Signed)
Patient has been discharged. Called by Dr. Cherlynn KaiserSainani when MD noticed patient had a fever at lunch time vitals. Primary RN was not notified by nurse tech of temperature and patient has already left the hospital. Patient called and instructed to go and pick up an antibiotic from her pharmacy at [facility] on [address] [place] per patient request. Also instructed patient that if she is to spike any more fevers to go to her doctors office and be tested for the flu. Dr. Cherlynn KaiserSainani is sending the prescription to [facility] pharmacy now. Patient verbalized understanding with no issues.

## 2016-07-17 NOTE — Progress Notes (Signed)
Discharge instructions given. IV's and tele removed. Care management has completed home health request. Education given on diagnoses as well as metoprolol. Questions answered and patient instructed to follow up with PCP.

## 2016-07-17 NOTE — Progress Notes (Signed)
Pharmacy Antibiotic Note  Eileen Walsh is a 83 y.o. female admitted on 07/15/2016 with CAP.  Pharmacy has been consulted for Zosyn dosing.  Plan: Zosyn 3.375 gm IV Q8H EI  Height: 5\' 2"  (157.5 cm) Weight: 191 lb 1.6 oz (86.7 kg) IBW/kg (Calculated) : 50.1  Temp (24hrs), Avg:98.2 F (36.8 C), Min:97.6 F (36.4 C), Max:98.6 F (37 C)   Recent Labs Lab 07/15/16 2202 07/16/16 0619 07/17/16 0634  WBC 19.1* 18.6*  --   CREATININE 0.89 0.81 0.82    Estimated Creatinine Clearance: 51.2 mL/min (by C-G formula based on SCr of 0.82 mg/dL).    Allergies  Allergen Reactions  . Aspirin Other (See Comments)  . Sulfa Antibiotics Hives and Other (See Comments)   Thank you for allowing pharmacy to be a part of this patient's care.  Cristella Stiver D, Pharm.D., BCPS Clinical Pharmacist 07/17/2016 11:46 AM

## 2016-07-17 NOTE — Discharge Summary (Signed)
[facility] [place] [facility]   PATIENT NAME: Eileen Walsh    MR#:  [id number]  DATE OF BIRTH:  June 25, 1928  DATE OF ADMISSION:  07/15/2016 ADMITTING PHYSICIAN: Tonye Royalty, DO  DATE OF DISCHARGE: 07/17/2016  PRIMARY CARE PHYSICIAN: [name], MD    ADMISSION DIAGNOSIS:  Hyperbilirubinemia [E80.6] Generalized abdominal pain [R10.84] RUQ pain [R10.11] Transaminitis [R74.0] New onset a-fib (HCC) [I48.91]  DISCHARGE DIAGNOSIS:  Active Problems:   Atrial fibrillation with RVR (HCC)   SECONDARY DIAGNOSIS:   Past Medical History:  Diagnosis Date  . Arthritis   . Fibromyalgia   . Hypertension   . Macular degeneration     HOSPITAL COURSE:   83 year old female with past medical history of hypertension, fibromyalgia, osteoarthritis, macular degeneration who presents to the hospital due to abdominal pain and noted to be in new onset atrial fibrillation.  1. New onset atrial fibrillation-Patient was given some IV Cardizem and converted to normal sinus rhythm in the emergency room.  -Maintaining on oral metoprolol in the hospital and remains in sinus rhythm. Seen by cardiology and echocardiogram showed normal ejection fraction. Patient is going to be discharged on oral metoprolol with follow-up with cardiology as outpatient.  2. Right upper quadrant abdominal pain-etiology unclear. CT scan of the abdomen and pelvis on admission showing no acute hepatobiliary pathology. LFTs were elevated but have trended down.  - much improved since admission and tolerating PO well felt any pain, nausea or vomiting.  3. Dizziness/nausea-due to vertigo. Improved with meclizine and Valium in the hospital. Diane patient's orthostatic vital signs were negative. She underwent CT scan of the head and cervical spine on admission which were negative for acute pathology. Her symptoms are much improved back to baseline.  4. Pre-diabetes - BS remained stable while in  hospital.     5. GERD - pt. Will resume her Omeprazole  6. HTN - pt. Will cont. Norvasc, Metoprolol, Avapro  7. Fever - pt. Had a fever of 101 on the day of discharge and I was not notified until the pt. Had been discharged.  Pt's UA was (-) and CT abd/pelvis was (-) for any acute pathology on admission. I will empirically treat her with 5 days of Levaquin and call the prescription in for the patient.  If she continues to have fever spikes at home she needs to call her pcp or come back to ER and get tested for the FLU.   Pt. Is being discharged with Home Health PT, RN.   DISCHARGE CONDITIONS:   Stable.   CONSULTS OBTAINED:  Treatment Team:  Marcina Millard, MD  DRUG ALLERGIES:   Allergies  Allergen Reactions  . Aspirin Other (See Comments)  . Sulfa Antibiotics Hives and Other (See Comments)    DISCHARGE MEDICATIONS:   Allergies as of 07/17/2016      Reactions   Aspirin Other (See Comments)   Sulfa Antibiotics Hives, Other (See Comments)      Medication List    TAKE these medications   amLODipine-olmesartan 5-40 MG tablet Commonly known as:  AZOR TAKE 1 TABLET DAILY   metoprolol tartrate 25 MG tablet Commonly known as:  LOPRESSOR Take 0.5 tablets (12.5 mg total) by mouth 2 (two) times daily.   multivitamin with minerals Tabs tablet Take 1 tablet by mouth daily.   omeprazole 20 MG capsule Commonly known as:  PRILOSEC TAKE 1 CAPSULE IN THE MORNING         DISCHARGE INSTRUCTIONS:   DIET:  Cardiac diet  DISCHARGE CONDITION:  Stable  ACTIVITY:  Activity as tolerated  OXYGEN:  Home Oxygen: No.   Oxygen Delivery: room air  DISCHARGE LOCATION:  Home with home health nursing and physical therapy   If you experience worsening of your admission symptoms, develop shortness of breath, life threatening emergency, suicidal or homicidal thoughts you must seek medical attention immediately by calling 911 or calling your MD immediately  if symptoms less  severe.  You Must read complete instructions/literature along with all the possible adverse reactions/side effects for all the Medicines you take and that have been prescribed to you. Take any new Medicines after you have completely understood and accpet all the possible adverse reactions/side effects.   Please note  You were cared for by a hospitalist during your hospital stay. If you have any questions about your discharge medications or the care you received while you were in the hospital after you are discharged, you can call the unit and asked to speak with the hospitalist on call if the hospitalist that took care of you is not available. Once you are discharged, your primary care physician will handle any further medical issues. Please note that NO REFILLS for any discharge medications will be authorized once you are discharged, as it is imperative that you return to your primary care physician (or establish a relationship with a primary care physician if you do not have one) for your aftercare needs so that they can reassess your need for medications and monitor your lab values.     Today   Complaining of pain in left side of head from fall and trauma. Dizziness/N/V much improved.  Remains in NSR.   VITAL  SIGNS:  Blood pressure (!) 158/72, pulse 78, temperature (!) 101.2 F (38.4 C), temperature source Oral, resp. rate 18, height 5\' 2"  (1.575 m), weight 86.7 kg (191 lb 1.6 oz), SpO2 93 %.  I/O:   Intake/Output Summary (Last 24 hours) at 07/17/16 1502 Last data filed at 07/17/16 1430  Gross per 24 hour  Intake           1567.5 ml  Output              900 ml  Net            667.5 ml    PHYSICAL EXAMINATION:   GENERAL:  83 y.o.-year-old patient lying in the bed inno acute distress.  EYES: Pupils equal, round, reactive to light and accommodation. No scleral icterus. Extraocular muscles intact.  HEENT: Bruising around the left eye and forehead due to recent fall, normocephalic.  Oropharynx and nasopharynx clear.  NECK:  Supple, no jugular venous distention. No thyroid enlargement, no tenderness.  LUNGS: Normal breath sounds bilaterally, no wheezing, rales, rhonchi. No use of accessory muscles of respiration.  CARDIOVASCULAR: S1, S2 normal. No murmurs, rubs, or gallops.  ABDOMEN: Soft, nontender, nondistended. Bowel sounds present. No organomegaly or mass.  EXTREMITIES: No cyanosis, clubbing or edema b/l.    NEUROLOGIC: Cranial nerves II through XII are intact. No focal Motor or sensory deficits b/l.   PSYCHIATRIC: The patient is alert and oriented x 3.  SKIN: No obvious rash, lesion, or ulcer.   DATA REVIEW:   CBC  Recent Labs Lab 07/16/16 0619  WBC 18.6*  HGB 14.2  HCT 40.6  PLT 207    Chemistries   Recent Labs Lab 07/16/16 0619 07/17/16 0634  NA 137 136  K 3.8 3.6  CL 106 107  CO2 23  23  GLUCOSE 121* 126*  BUN 13 11  CREATININE 0.81 0.82  CALCIUM 8.4* 8.2*  MG 1.8  --   AST 255* 140*  ALT 240* 188*  ALKPHOS 82 85  BILITOT 5.0* 3.7*    Cardiac Enzymes  Recent Labs Lab 07/16/16 1654  TROPONINI 0.06*    Microbiology Results  No results found for this or any previous visit.  RADIOLOGY:  Dg Chest 1 View  Result Date: 07/15/2016 CLINICAL DATA:  New onset atrial fibrillation.  Fall. EXAM: CHEST 1 VIEW COMPARISON:  07/17/2013 FINDINGS: The heart is normal in size. Prominent hila, right greater than left. There is diffuse bronchial and interstitial thickening throughout both lungs. No confluent airspace disease. No pleural fluid or pneumothorax. No evidence of acute osseous abnormality. IMPRESSION: 1. Diffuse bronchial and interstitial thickening throughout both lungs, may be vascular congestion or bronchitic. 2. Normal heart size. Prominent hila, right greater than left. This may be enlarged pulmonary vessels, cannot exclude adenopathy. Chest CT with contrast would better defined. Electronically Signed   By: Rubye Oaks M.D.   On:  07/15/2016 22:35   Ct Head Wo Contrast  Result Date: 07/15/2016 CLINICAL DATA:  Larey Seat several hours ago while leaning over the side of the bed to vomit. Struck head on a side table. EXAM: CT HEAD WITHOUT CONTRAST CT CERVICAL SPINE WITHOUT CONTRAST TECHNIQUE: Multidetector CT imaging of the head and cervical spine was performed following the standard protocol without intravenous contrast. Multiplanar CT image reconstructions of the cervical spine were also generated. COMPARISON:  None. FINDINGS: CT HEAD FINDINGS Brain: There is no intracranial hemorrhage, mass or evidence of acute infarction. There is moderate generalized atrophy. There is moderate chronic microvascular ischemic change. There is no significant extra-axial fluid collection. No acute intracranial findings are evident. Vascular: No hyperdense vessel or unexpected calcification. Skull: Normal. Negative for fracture or focal lesion. Sinuses/Orbits: No acute finding. Other: Left frontal scalp hematoma CT CERVICAL SPINE FINDINGS Alignment: Normal. Skull base and vertebrae: No acute fracture. No primary bone lesion or focal pathologic process. Soft tissues and spinal canal: No prevertebral fluid or swelling. No visible canal hematoma. Disc levels: Moderate degenerative cervical disc disease, greatest at C6-7. Mild degenerative appearing anterolisthesis at C4-5. Facet articulations are moderately arthritic but intact. Upper chest: Negative. Other: None IMPRESSION: 1. No acute intracranial findings. There is moderate generalized atrophy and chronic appearing white matter hypodensities which likely represent small vessel ischemic disease. 2. Negative for acute cervical spine fracture Electronically Signed   By: Ellery Plunk M.D.   On: 07/15/2016 23:46   Ct Cervical Spine Wo Contrast  Result Date: 07/15/2016 CLINICAL DATA:  Larey Seat several hours ago while leaning over the side of the bed to vomit. Struck head on a side table. EXAM: CT HEAD WITHOUT  CONTRAST CT CERVICAL SPINE WITHOUT CONTRAST TECHNIQUE: Multidetector CT imaging of the head and cervical spine was performed following the standard protocol without intravenous contrast. Multiplanar CT image reconstructions of the cervical spine were also generated. COMPARISON:  None. FINDINGS: CT HEAD FINDINGS Brain: There is no intracranial hemorrhage, mass or evidence of acute infarction. There is moderate generalized atrophy. There is moderate chronic microvascular ischemic change. There is no significant extra-axial fluid collection. No acute intracranial findings are evident. Vascular: No hyperdense vessel or unexpected calcification. Skull: Normal. Negative for fracture or focal lesion. Sinuses/Orbits: No acute finding. Other: Left frontal scalp hematoma CT CERVICAL SPINE FINDINGS Alignment: Normal. Skull base and vertebrae: No acute fracture. No primary bone lesion or  focal pathologic process. Soft tissues and spinal canal: No prevertebral fluid or swelling. No visible canal hematoma. Disc levels: Moderate degenerative cervical disc disease, greatest at C6-7. Mild degenerative appearing anterolisthesis at C4-5. Facet articulations are moderately arthritic but intact. Upper chest: Negative. Other: None IMPRESSION: 1. No acute intracranial findings. There is moderate generalized atrophy and chronic appearing white matter hypodensities which likely represent small vessel ischemic disease. 2. Negative for acute cervical spine fracture Electronically Signed   By: Ellery Plunkaniel R Mitchell M.D.   On: 07/15/2016 23:46   Ct Angio Abd/pel W And/or Wo Contrast  Result Date: 07/16/2016 CLINICAL DATA:  Diffuse abdominal pain. Fall out of bed. New onset atrial fibrillation. EXAM: CTA ABDOMEN AND PELVIS wITHOUT AND WITH CONTRAST TECHNIQUE: Multidetector CT imaging of the abdomen and pelvis was performed using the standard protocol during bolus administration of intravenous contrast. Multiplanar reconstructed images and MIPs  were obtained and reviewed to evaluate the vascular anatomy. CONTRAST:  100 cc Isovue 370 IV COMPARISON:  None. FINDINGS: VASCULAR Aorta: Normal caliber aorta without aneurysm, dissection, vasculitis or significant stenosis. Moderate atherosclerosis. Tiny accessory artery arises from the uppermost thoracic aorta superior to the celiac artery. Celiac: Patent without evidence of aneurysm, dissection, vasculitis or significant stenosis. Mild branch atherosclerosis. SMA: Patent without evidence of aneurysm, dissection, vasculitis or significant stenosis. Renals: Both renal arteries are patent without evidence of aneurysm, dissection, vasculitis, fibromuscular dysplasia or significant stenosis. IMA: Patent without evidence of aneurysm, dissection, vasculitis or significant stenosis. Inflow: Patent without evidence of aneurysm, dissection, vasculitis or significant stenosis. Proximal Outflow: Bilateral common femoral and visualized portions of the superficial and profunda femoral arteries are patent without evidence of aneurysm, dissection, vasculitis or significant stenosis. Veins: No acute abnormality of the a iliac, IVC, mesenteric or portal veins. Review of the MIP images confirms the above findings. NON-VASCULAR Lower chest: Minimal patchy ground-glass opacity in the dependent right lower lobe, with a possible solid component measuring 10 mm. No fractures of the included ribs. The heart is normal in size. Hepatobiliary: Scattered low-density lesions throughout the liver, largest in the right lobe measures 2.9 cm. Larger lesions are consistent with simple cysts, smaller lesions are too small to characterize but likely small cysts. No suspicious hepatic lesion. Gallbladder physiologically distended, no calcified stone. No biliary dilatation. Pancreas: No ductal dilatation or inflammation. Spleen: Normal in size without focal abnormality. Adrenals/Urinary Tract: A 5.0 x 3.2 cm well-defined structure in the left upper  quadrant appears discretely separate from the left adrenal gland. There is no definite adrenal nodule. No hydronephrosis or perinephric edema. Tiny cortical hypodensities in both kidneys, too small to characterize. Urinary bladder is physiologically distended. No bladder wall thickening. Stomach/Bowel: No stomach is decompressed. Descending and sigmoid colonic diverticulosis without acute inflammation. Normal appendix. No evidence of bowel wall thickening, distention or obstruction. Lymphatic: A few scattered mesenteric nodes in the left lower quadrant. No retroperitoneal adenopathy. No pelvic adenopathy. Reproductive: Post hysterectomy. Left ovary is quiescent. Mixed density structure in the right adnexa measures 4.5 x 3.6 cm, is heterogeneous and contains some internal calcifications, with both cystic and more soft tissue/solid components. Other: There is a well-defined ovoid structure in the left upper quadrant adjacent to the medial spleen and gastric cardia measuring 3.2 x 5.0 cm. Density suggests soft tissue or complex fluid, and etiology is indeterminate. Tiny fat containing umbilical hernia. No free air or free fluid in the abdomen or pelvis. Musculoskeletal: No acute fracture of the bony pelvis, lumbar spine or included ribs. Multilevel degenerative  change in the spine. There are no acute or suspicious osseous abnormalities. IMPRESSION: VASCULAR 1. No acute vascular abnormality. 2. Moderate atherosclerosis. NON-VASCULAR 1. No acute finding in the abdomen or pelvis. A ground-glass opacity in the dependent right lower lobe is nonspecific, and may be infectious/inflammatory. There is a possible solid component. Follow-up non-contrast CT recommended at 3-6 months to confirm persistence. If unchanged, and solid component remains <6 mm, annual CT is recommended until 5 years of stability has been established. If persistent these nodules should be considered highly suspicious if the solid component of the nodule  is 6 mm or greater in size and enlarging. This recommendation follows the consensus statement: Guidelines for Management of Incidental Pulmonary Nodules Detected on CT Images: From the Fleischner Society 2017; Radiology 2017; 284:228-243. 2. Complex right adnexal mass, 4.5 cm greatest dimension. No prior exams for comparison, and patient is post hysterectomy. Prior exams would be most helpful if available, in the absence of prior imaging, recommend nonemergent ultrasound characterization. If not visualized sonographically and no prior exams are available, consider further evaluation with pelvic MRI or surgical evaluation. 3. Left upper quadrant structure may be well-defined complex fluid or solid lesion, abutting the medial spleen and posterior stomach. This is indeterminate. Again, comparison with any prior imaging would be most helpful. This may be a lymphangioma, however exophytic gastric or splenic lesion is considered. 4. Colonic diverticulosis without diverticulitis. Incidental hepatic cysts. Electronically Signed   By: Rubye Oaks M.D.   On: 07/16/2016 00:17   US Abdomen Limited Ruq  Result Date: 07/16/2016 CLINICAL DATA:  Right upper quadrant pain. EXAM: US ABDOMEN LIMITED - RIGHT UPPER QUADRANT COMPARISON:  CT 2 hours prior. FINDINGS: Gallbladder: Physiologically distended. No gallstones or wall thickening visualized. No sonographic Murphy sign noted by sonographer. Common bile duct: Diameter: 5.7 mm Liver: Cyst in the right lobe measures 2.6 x 1.9 x 1.9 cm, cyst in the right inferior liver measures 3.1 x 3.0 x 3.8 cm. There is diffusely increased parenchymal echogenicity. Normal directional flow in the main portal vein. IMPRESSION: 1. Normal sonographic appearance of the gallbladder. No biliary dilatation. 2. Hepatic steatosis and cysts. Electronically Signed   By: Rubye Oaks M.D.   On: 07/16/2016 02:56      Management plans discussed with the patient, family and they are in  agreement.  CODE STATUS:     Code Status Orders        Start     Ordered   07/16/16 0457  Full code  Continuous     07/16/16 0456    Code Status History    Date Active Date Inactive Code Status Order ID Comments User Context   This patient has a current code status but no historical code status.    Advance Directive Documentation   Flowsheet Row Most Recent Value  Type of Advance Directive  Healthcare Power of Attorney, Living will  Pre-existing out of facility DNR order (yellow form or pink MOST form)  No data  "MOST" Form in Place?  No data      TOTAL TIME TAKING CARE OF THIS PATIENT: 40 minutes.    Houston Siren M.D on 07/17/2016 at 3:02 PM  Between 7am to 6pm - Pager - [phone]  After 6pm go to [email or web address] - Social research officer, government  [facility] [place]  Office  [phone]  CC: Primary care physician; [name], MD

## 2016-07-17 NOTE — Progress Notes (Signed)
Physical Therapy Treatment Patient Details Name: Eileen Walsh MRN: [id number] DOB: 04-20-1929 Today's Date: 07/17/2016    History of Present Illness 83 y.o. female with a known history of OA, fibromyalgia, HTN, CAD, HLD, GERD was in a usual state of health until  today when she experienced sudden onset of right upper quadrant abdominal pain associated with nausea and dizziness but no vomiting or fevers.  She fell out of bed when she tried to vomit and sustained a head injury.    PT Comments    Pt did much better than yesterday and showed ability to return home with family assist.  She is still not near her baseline and will need QD assist, help with errands/groceries/etc and HHPT.  Pt is still impulsive and showed considerable unsteadiness while trying to stand/walk w/o UE assist.  Pt eager to go home, daughter present and reports that she and her siblings will be available and able to assist her frequently until she is back at her baseline.   Follow Up Recommendations  Home health PT;Supervision - Intermittent     Equipment Recommendations       Recommendations for Other Services       Precautions / Restrictions Precautions Precautions: Fall Restrictions Weight Bearing Restrictions: No    Mobility  Bed Mobility Overal bed mobility: Modified Independent                Transfers Overall transfer level: Modified independent Equipment used: Rolling walker (2 wheeled)             General transfer comment: Pt able to rise to standing w/o assist, showed good relative confidence with standing but with definite need of UE support  Ambulation/Gait Ambulation/Gait assistance: Min guard;Supervision Ambulation Distance (Feet): 100 Feet Assistive device: Rolling walker (2 wheeled)       General Gait Details: Pt continues to be unsure of herself with ambulation needing constant cuing but did not have any LOBs or significant safety concerns.  She was able to take a few  steps holding just the hallway rail, but she was not at all safe trying to walk w/o some UE support.  Pt fatigued with ~100 ft of walking but did much better than yesterday.   Stairs            Wheelchair Mobility    Modified Rankin (Stroke Patients Only)       Balance Overall balance assessment: Needs assistance         Standing balance support: Bilateral upper extremity supported Standing balance-Leahy Scale: Fair Standing balance comment: Pt with definite need of UEs to maintain balance in standing                    Cognition Arousal/Alertness: Awake/alert Behavior During Therapy: Restless Overall Cognitive Status: Within Functional Limits for tasks assessed                      Exercises      General Comments        Pertinent Vitals/Pain Pain Assessment: No/denies pain    Home Living                      Prior Function            PT Goals (current goals can now be found in the care plan section) Progress towards PT goals: Progressing toward goals    Frequency    Min 2X/week  PT Plan Discharge plan needs to be updated    Co-evaluation             End of Session Equipment Utilized During Treatment: Gait belt Activity Tolerance: Patient limited by fatigue Patient left: with call bell/phone within reach;with bed alarm set;with family/visitor present     Time: [id number] PT Time Calculation (min) (ACUTE ONLY): 17 min  Charges:  $Gait Training: 8-22 mins                    G Codes:      Malachi ProGalen R Quanetta Truss, DPT 07/17/2016, 1:09 PM

## 2016-07-28 ENCOUNTER — Telehealth: Payer: Self-pay

## 2016-07-28 DIAGNOSIS — R42 Dizziness and giddiness: Secondary | ICD-10-CM

## 2016-07-28 NOTE — Telephone Encounter (Signed)
ok 

## 2016-07-28 NOTE — Addendum Note (Signed)
Addended by: Latanya Presser'DELL, Kynslie Ringle M on: 07/28/2016 02:38 PM   Modules accepted: Orders

## 2016-07-28 NOTE — Telephone Encounter (Signed)
Please advise. Thanks.  

## 2016-07-28 NOTE — Telephone Encounter (Signed)
Order placed

## 2016-07-28 NOTE — Telephone Encounter (Signed)
Patients son called office requesting referral to [facility]. Patients son states that patient has appt tomorrow morning at 11:45 to see specialist about Vertigo. He was informed by providers office that a referral would need to be ordered before patient can be seen. Please review. KW

## 2016-09-23 ENCOUNTER — Other Ambulatory Visit: Payer: Self-pay | Admitting: Family Medicine

## 2016-12-21 ENCOUNTER — Ambulatory Visit (INDEPENDENT_AMBULATORY_CARE_PROVIDER_SITE_OTHER): Payer: Medicare Other | Admitting: Family Medicine

## 2016-12-21 VITALS — BP 124/62 | HR 82 | Temp 98.4°F | Resp 14 | Wt 192.0 lb

## 2016-12-21 DIAGNOSIS — I10 Essential (primary) hypertension: Secondary | ICD-10-CM | POA: Diagnosis not present

## 2016-12-21 DIAGNOSIS — M797 Fibromyalgia: Secondary | ICD-10-CM | POA: Diagnosis not present

## 2016-12-21 DIAGNOSIS — Z23 Encounter for immunization: Secondary | ICD-10-CM

## 2016-12-21 DIAGNOSIS — G629 Polyneuropathy, unspecified: Secondary | ICD-10-CM | POA: Diagnosis not present

## 2016-12-21 DIAGNOSIS — K219 Gastro-esophageal reflux disease without esophagitis: Secondary | ICD-10-CM | POA: Diagnosis not present

## 2016-12-21 DIAGNOSIS — I4891 Unspecified atrial fibrillation: Secondary | ICD-10-CM | POA: Diagnosis not present

## 2016-12-21 DIAGNOSIS — S060X0D Concussion without loss of consciousness, subsequent encounter: Secondary | ICD-10-CM

## 2016-12-21 DIAGNOSIS — E6609 Other obesity due to excess calories: Secondary | ICD-10-CM

## 2016-12-21 DIAGNOSIS — M1990 Unspecified osteoarthritis, unspecified site: Secondary | ICD-10-CM | POA: Diagnosis not present

## 2016-12-21 DIAGNOSIS — Z6835 Body mass index (BMI) 35.0-35.9, adult: Secondary | ICD-10-CM

## 2016-12-21 NOTE — Progress Notes (Signed)
Eileen Walsh  MRN: [id number] DOB: 06-27-1928  Subjective:  HPI  Patient is here for 6 months follow up. Last office visit was on 06/16/16 for physical. Since then patient was at the hospital 07/15/16-07/17/16 and was diagnosed with new onset atrial fibrillation. Patient was put on Metoprolol. She has had follow up with Dr Gwen PoundsKowalski since then. Patient states that day she felt sick and was sitting on the side of her bed, she leaned over to her bucket she had to throw up but ended up falling off her bed and hit her head on the open drawer from her night stand. After that patient did not feel "together" after that, she could not think of how to call 911 but was able to call her son and he helped patient and got patient to the hospital. She was told she probably had a viral infection was not clear on what caused her to be sick but found out she had atrial fibrillation which patient was not aware until her daughter told her. It said patient was started on Meotprolol but patient states she did not take this. IN Dr Philemon KingdomKowalski's note from February it was noted "no medication management needed at this time due to stable heart rate control." Patient is not having cardiac symptoms but since hitting her head still feels "goofy." patient states she is not having memory issues but just having to concentrate more on what she is doing.  Routine lab work was done on 07/16/16. Patient Active Problem List   Diagnosis Date Noted  . Atrial fibrillation with RVR (HCC) 07/16/2016  . Arthritis 11/05/2014  . Back ache 11/05/2014  . Clinical depression 11/05/2014  . Diabetes (HCC) 11/05/2014  . Fibrositis 11/05/2014  . Acid reflux 11/05/2014  . Blood glucose elevated 11/05/2014  . HLD (hyperlipidemia) 11/05/2014  . BP (high blood pressure) 11/05/2014  . Neuropathy 11/05/2014  . Adiposity 11/05/2014  . 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  . Arteriosclerosis of coronary artery 04/06/2014  . Essential  (primary) hypertension 04/06/2014  . 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/2014    Past Medical History:  Diagnosis Date  . Arthritis   . Fibromyalgia   . Hypertension   . Macular degeneration     Social History   Social History  . Marital status: Widowed    Spouse name: N/A  . Number of children: N/A  . Years of education: N/A   Occupational History  . Not on file.   Social History Main Topics  . Smoking status: Never Smoker  . Smokeless tobacco: Never Used  . Alcohol use 0.6 oz/week    1 Glasses of wine per week  . Drug use: No  . Sexual activity: Not on file   Other Topics Concern  . Not on file   Social History Narrative  . No narrative on file    Outpatient Encounter Prescriptions as of 12/21/2016  Medication Sig  . amLODipine-olmesartan (AZOR) 5-40 MG tablet TAKE 1 TABLET DAILY  . Multiple Vitamin (MULTIVITAMIN WITH MINERALS) TABS tablet Take 1 tablet by mouth daily.  Marland Kitchen. omeprazole (PRILOSEC) 20 MG capsule TAKE 1 CAPSULE IN THE MORNING  . metoprolol tartrate (LOPRESSOR) 25 MG tablet Take 0.5 tablets (12.5 mg total) by mouth 2 (two) times daily.   No facility-administered encounter medications on file as of 12/21/2016.     Allergies  Allergen Reactions  . Aspirin Other (See Comments)  . Sulfa Antibiotics Hives and Other (See Comments)    Review  of Systems  Constitutional: Positive for malaise/fatigue.  HENT:       Runny nose and sneezing  Eyes: Negative.        Visual disturbance  Respiratory: Negative.   Cardiovascular: Negative.   Gastrointestinal: Negative for abdominal pain, constipation, diarrhea, nausea and vomiting.  Musculoskeletal: Positive for back pain, falls, joint pain, myalgias and neck pain.  Skin: Negative.   Neurological: Positive for dizziness and tingling (in her feet). Negative for headaches.       Concentrating harder on things she has to do.  Endo/Heme/Allergies: Negative.   Psychiatric/Behavioral: Negative.      Objective:  BP 124/62   Pulse 82   Temp 98.4 F (36.9 C)   Resp 14   Wt 192 lb (87.1 kg)   BMI 35.12 kg/m   Physical Exam  Constitutional: She is oriented to person, place, and time and well-developed, well-nourished, and in no distress.  HENT:  Head: Normocephalic and atraumatic.  Right Ear: External ear normal.  Left Ear: External ear normal.  Nose: Nose normal.  Eyes: Conjunctivae are normal. Pupils are equal, round, and reactive to light.  Neck: Normal range of motion. Neck supple.  Cardiovascular: Normal rate, regular rhythm, normal heart sounds and intact distal pulses.  Exam reveals no gallop.   No murmur heard. Pulmonary/Chest: Effort normal and breath sounds normal. No respiratory distress. She has no wheezes.  Abdominal: Soft.  Musculoskeletal: She exhibits edema (trace). She exhibits no tenderness.  Neurological: She is alert and oriented to person, place, and time.  Skin: Skin is warm and dry.  Psychiatric: Mood, memory, affect and judgment normal.   Assessment and Plan :  1. Essential (primary) hypertension Stable. Continue current medication.  2. Gastroesophageal reflux disease, esophagitis presence not specified Stable on Omeprazole.  3. Fibromyalgia 4. Neuropathy Stable.  5. Atrial fibrillation with RVR (HCC) Normal rhythm today. No medication management at this time. Followed by cardiology.  6. Arthritis  7. Concussion without loss of consciousness, subsequent encounter Stable at this time. Will follow. No sequellae. Patient advised when she gets a cut or abrasion to come in and get Td updated. 8.Obesity  HPI, Exam and A&P transcribed by Samara Deist, RMA under direction and in the presence of Julieanne Manson, MD. I have done the exam and reviewed the chart and it is accurate to the best of my knowledge. Dentist has been used and  any errors in dictation or transcription are unintentional. Julieanne Manson M.D. [facility]

## 2017-01-05 ENCOUNTER — Other Ambulatory Visit: Payer: Self-pay | Admitting: Family Medicine

## 2017-04-26 ENCOUNTER — Encounter: Payer: Self-pay | Admitting: Family Medicine

## 2017-04-26 ENCOUNTER — Ambulatory Visit (INDEPENDENT_AMBULATORY_CARE_PROVIDER_SITE_OTHER): Payer: Medicare Other | Admitting: Family Medicine

## 2017-04-26 VITALS — BP 140/80 | HR 76 | Temp 97.9°F | Resp 16 | Wt 189.0 lb

## 2017-04-26 DIAGNOSIS — Z8639 Personal history of other endocrine, nutritional and metabolic disease: Secondary | ICD-10-CM | POA: Diagnosis not present

## 2017-04-26 DIAGNOSIS — I4891 Unspecified atrial fibrillation: Secondary | ICD-10-CM | POA: Diagnosis not present

## 2017-04-26 DIAGNOSIS — R351 Nocturia: Secondary | ICD-10-CM | POA: Diagnosis not present

## 2017-04-26 DIAGNOSIS — I10 Essential (primary) hypertension: Secondary | ICD-10-CM

## 2017-04-26 DIAGNOSIS — K219 Gastro-esophageal reflux disease without esophagitis: Secondary | ICD-10-CM

## 2017-04-26 DIAGNOSIS — E7849 Other hyperlipidemia: Secondary | ICD-10-CM

## 2017-04-26 DIAGNOSIS — M797 Fibromyalgia: Secondary | ICD-10-CM | POA: Diagnosis not present

## 2017-04-26 NOTE — Patient Instructions (Signed)
Try over the counter Lysine

## 2017-04-26 NOTE — Progress Notes (Signed)
Patient: Eileen Walsh Female    DOB: 01/23/1929   83 y.o.   MRN: [id number] Visit Date: 04/26/2017  Today's Provider: Megan Mansichard Gilbert Jr, MD   Chief Complaint  Patient presents with  . Hypertension  . Gastroesophageal Reflux   Subjective:    HPI  Hypertension, follow-up:  BP Readings from Last 3 Encounters:  04/26/17 140/80  12/21/16 124/62  07/17/16 (!) 158/72    She was last seen for hypertension 6 months ago.  BP at that visit was 124/62. Management since that visit includes none. She reports good compliance with treatment. She is not having side effects.  She is exercising. She is adherent to low salt diet.   Outside blood pressures are not being checked. Patient denies chest pain, chest pressure/discomfort, claudication, dyspnea, exertional chest pressure/discomfort, fatigue, irregular heart beat, lower extremity edema, near-syncope, orthopnea, palpitations, paroxysmal nocturnal dyspnea, syncope and tachypnea.    Wt Readings from Last 3 Encounters:  04/26/17 189 lb (85.7 kg)  12/21/16 192 lb (87.1 kg)  07/16/16 191 lb 1.6 oz (86.7 kg)    ------------------------------------------------------------------------     Allergies  Allergen Reactions  . Aspirin Other (See Comments)  . Sulfa Antibiotics Hives and Other (See Comments)     Current Outpatient Medications:  .  amLODipine-olmesartan (AZOR) 5-40 MG tablet, TAKE 1 TABLET DAILY, Disp: 90 tablet, Rfl: 3 .  Multiple Vitamin (MULTIVITAMIN WITH MINERALS) TABS tablet, Take 1 tablet by mouth daily., Disp: , Rfl:  .  omeprazole (PRILOSEC) 20 MG capsule, TAKE 1 CAPSULE IN THE MORNING, Disp: 90 capsule, Rfl: 3  Review of Systems  Constitutional: Negative.   HENT: Positive for mouth sores.   Eyes: Negative.   Respiratory: Negative.   Cardiovascular: Negative.   Gastrointestinal: Negative.   Endocrine: Negative.   Genitourinary: Negative.   Musculoskeletal: Positive for back pain.  Skin:  Negative.   Allergic/Immunologic: Negative.   Neurological: Negative.   Hematological: Negative.   Psychiatric/Behavioral: Negative.     Social History   Tobacco Use  . Smoking status: Never Smoker  . Smokeless tobacco: Never Used  Substance Use Topics  . Alcohol use: Yes    Alcohol/week: 0.6 oz    Types: 1 Glasses of wine per week   Objective:   BP 140/80 (BP Location: Left Arm, Patient Position: Sitting, Cuff Size: Normal)   Pulse 76   Temp 97.9 F (36.6 C) (Oral)   Resp 16   Wt 189 lb (85.7 kg)   SpO2 99%   BMI 34.57 kg/m  Vitals:   04/26/17 1455  BP: 140/80  Pulse: 76  Resp: 16  Temp: 97.9 F (36.6 C)  TempSrc: Oral  SpO2: 99%  Weight: 189 lb (85.7 kg)     Physical Exam  Constitutional: She is oriented to person, place, and time. She appears well-developed and well-nourished.  Eyes: Conjunctivae and EOM are normal. Pupils are equal, round, and reactive to light.  Neck: Normal range of motion. Neck supple.  Cardiovascular: Normal rate, regular rhythm, normal heart sounds and intact distal pulses.  Pulmonary/Chest: Effort normal and breath sounds normal.  Musculoskeletal: Normal range of motion. She exhibits edema (trace ).  Neurological: She is alert and oriented to person, place, and time. She has normal reflexes.  Skin: Skin is warm and dry.  Psychiatric: She has a normal mood and affect. Her behavior is normal. Judgment and thought content normal.        Assessment & Plan:  1. Essential (primary) hypertension Stable.  - CBC with Differential/Platelet - TSH  2. Gastroesophageal reflux disease, esophagitis presence not specified  - Comprehensive metabolic panel  3. Fibromyalgia   4. Atrial fibrillation with RVR (HCC)   5. Other hyperlipidemia   6. Nocturia   7. History of hyperglycemia  - Hemoglobin A1c  I have done the exam and reviewed the chart and it is accurate to the best of my knowledge. DentistDragon  technology has been used  and  any errors in dictation or transcription are unintentional. Julieanne Mansonichard Gilbert M.D. [facility]     HPI, Exam, and A&P Transcribed under the direction and in the presence of Richard L. Wendelyn BreslowGilbert Jr, MD  Electronically Signed: Silvio PateBrittany O'Dell, CMA   Richard Wendelyn BreslowGilbert Jr, MD  [facility]

## 2017-04-27 LAB — COMPLETE METABOLIC PANEL WITH GFR
AG Ratio: 1.5 (calc) (ref 1.0–2.5)
ALKALINE PHOSPHATASE (APISO): 77 U/L (ref 33–130)
ALT: 18 U/L (ref 6–29)
AST: 23 U/L (ref 10–35)
Albumin: 4.3 g/dL (ref 3.6–5.1)
BILIRUBIN TOTAL: 0.4 mg/dL (ref 0.2–1.2)
BUN/Creatinine Ratio: 18 (calc) (ref 6–22)
BUN: 16 mg/dL (ref 7–25)
CHLORIDE: 103 mmol/L (ref 98–110)
CO2: 27 mmol/L (ref 20–32)
Calcium: 9.8 mg/dL (ref 8.6–10.4)
Creat: 0.91 mg/dL — ABNORMAL HIGH (ref 0.60–0.88)
GFR, Est African American: 66 mL/min/{1.73_m2} (ref >=60)
GFR, Est Non African American: 57 mL/min/{1.73_m2} — ABNORMAL LOW (ref >=60)
GLUCOSE: 98 mg/dL (ref 65–99)
Globulin: 2.9 g/dL (calc) (ref 1.9–3.7)
Potassium: 4.3 mmol/L (ref 3.5–5.3)
Sodium: 137 mmol/L (ref 135–146)
Total Protein: 7.2 g/dL (ref 6.1–8.1)

## 2017-04-27 LAB — HEMOGLOBIN A1C
EAG (MMOL/L): 6.2 (calc)
Hgb A1c MFr Bld: 5.5 % of total Hgb (ref <5.7)
MEAN PLASMA GLUCOSE: 111 (calc)

## 2017-04-27 LAB — CBC WITH DIFFERENTIAL/PLATELET
BASOS ABS: 76 {cells}/uL (ref 0–200)
BASOS PCT: 0.9 %
EOS ABS: 160 {cells}/uL (ref 15–500)
EOS PCT: 1.9 %
HCT: 45.8 % — ABNORMAL HIGH (ref 35.0–45.0)
Hemoglobin: 16.1 g/dL — ABNORMAL HIGH (ref 11.7–15.5)
Lymphs Abs: 2814 cells/uL (ref 850–3900)
MCH: 31.3 pg (ref 27.0–33.0)
MCHC: 35.2 g/dL (ref 32.0–36.0)
MCV: 88.9 fL (ref 80.0–100.0)
MONOS PCT: 9.5 %
MPV: 9.7 fL (ref 7.5–12.5)
Neutro Abs: 4553 cells/uL (ref 1500–7800)
Neutrophils Relative %: 54.2 %
Platelets: 266 10*3/uL (ref 140–400)
RBC: 5.15 10*6/uL — ABNORMAL HIGH (ref 3.80–5.10)
RDW: 11.9 % (ref 11.0–15.0)
TOTAL LYMPHOCYTE: 33.5 %
WBC mixed population: 798 cells/uL (ref 200–950)
WBC: 8.4 10*3/uL (ref 3.8–10.8)

## 2017-04-27 LAB — TSH: TSH: 1.85 m[IU]/L (ref 0.40–4.50)

## 2017-05-17 NOTE — Telephone Encounter (Signed)
This encounter was created in error - please disregard.

## 2017-07-31 IMAGING — CT CT CERVICAL SPINE W/O CM
4 of 7 series · 14 of 33 positions shown, 15 images · non-contrast
Comparison: None.

CLINICAL DATA: Fell several hours ago while leaning over the side
of the bed to vomit. Struck head on a side table.

EXAM:
CT HEAD WITHOUT CONTRAST
CT CERVICAL SPINE WITHOUT CONTRAST
TECHNIQUE: Multidetector CT imaging of the head and cervical spine was
performed following the standard protocol without intravenous
contrast. Multiplanar CT image reconstructions of the cervical spine
were also generated.

[Series 7: c spine soft · axial · 0.29mm/px · z∈[-230,-126]mm · 4 of 88 slices shown]
[im 18/88  soft-tissue]
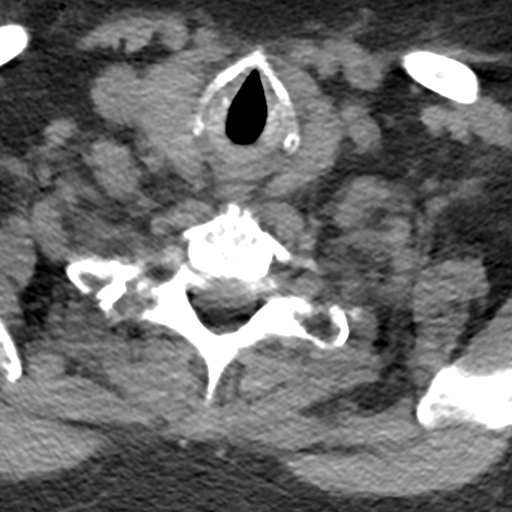
[im 35/88  soft-tissue]
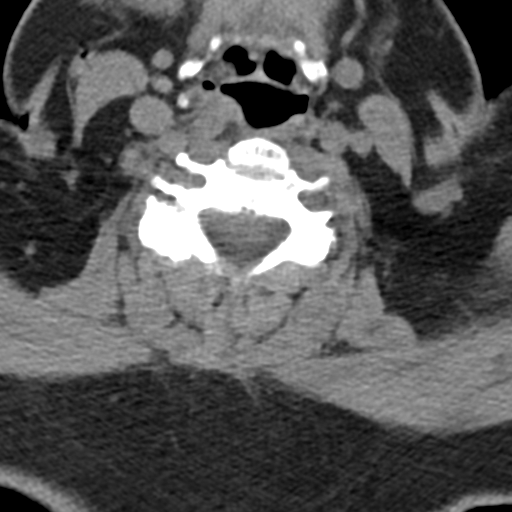
[im 53/88  soft-tissue]
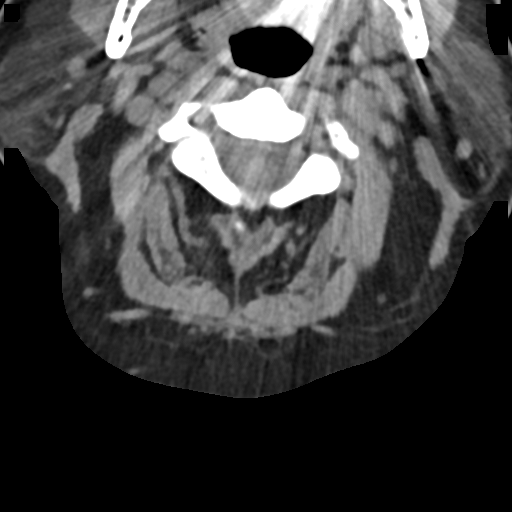
[im 70/88  soft-tissue]
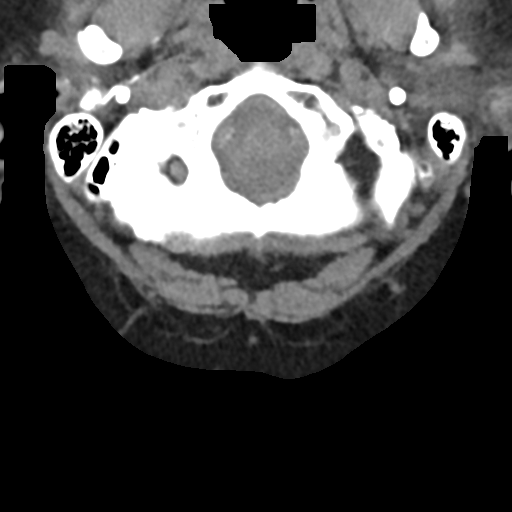

[Series 8: sagittal bone · sagittal · 0.26mm/px · 5 of 58 slices shown]
[im 10/58  bone]
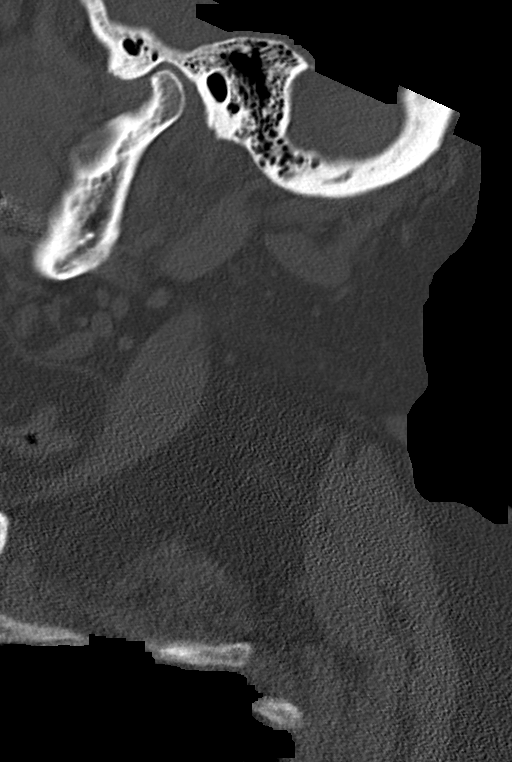
[im 20/58  bone]
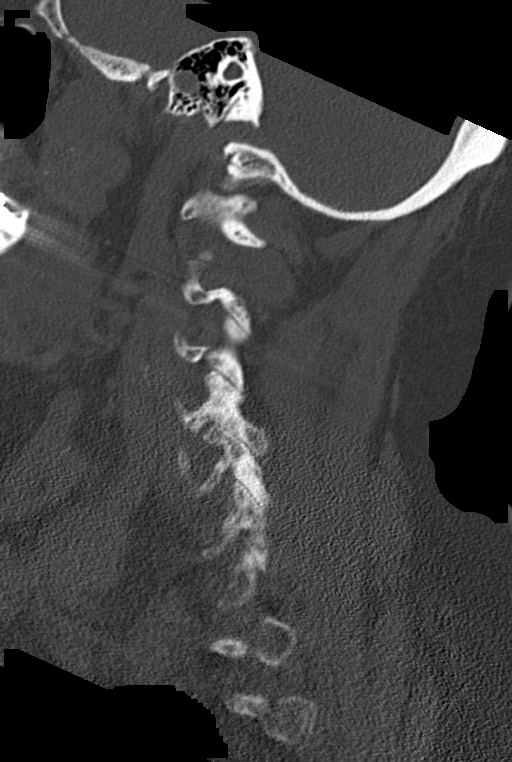
[im 29/58  bone]
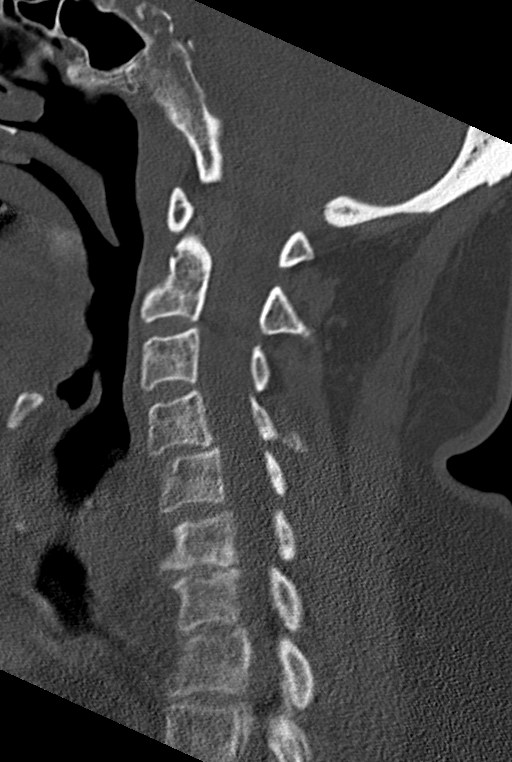
[im 39/58  bone]
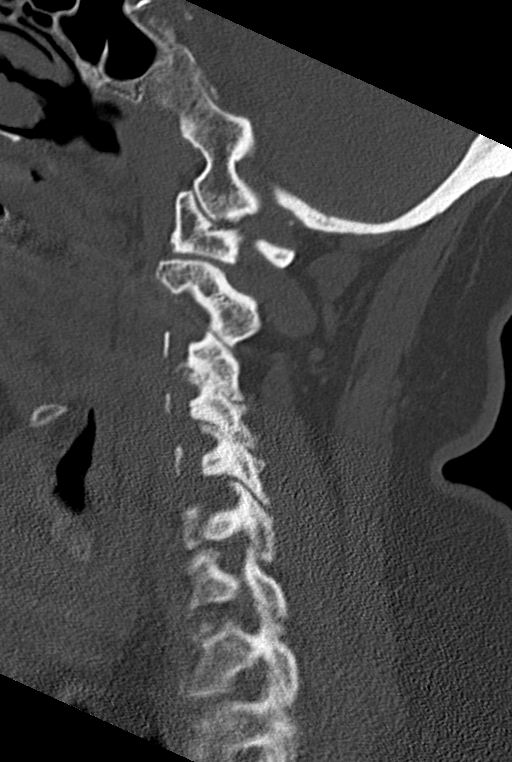
[im 48/58  bone]
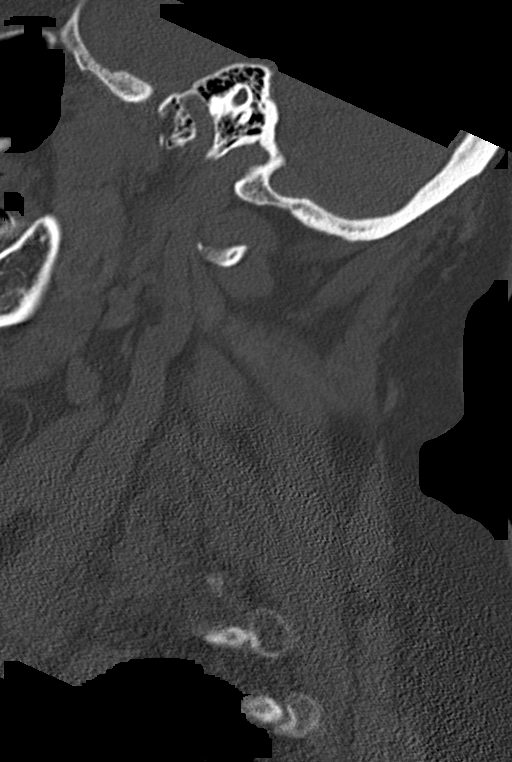

[Series 9: coronal bone · coronal · 0.26mm/px · 1 of 47 slices shown]
[im 24/47  bone]
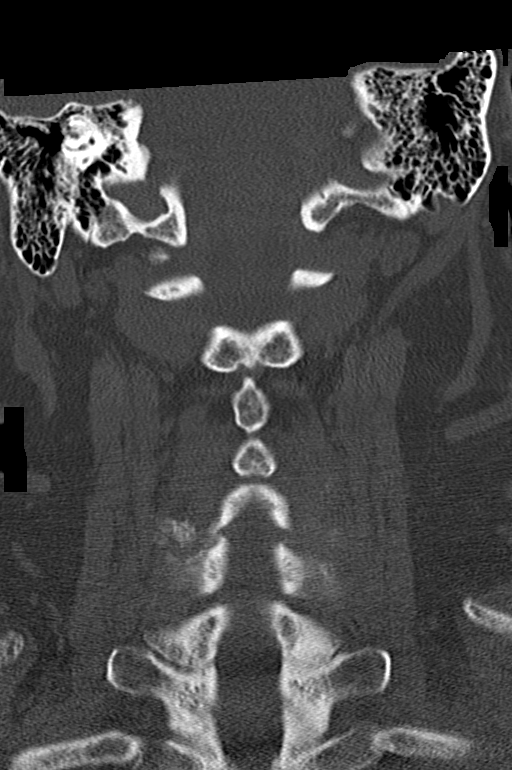

[Series 10: orthogonal bone · axial · 0.23mm/px · z∈[-248,-151]mm · 4 of 92 slices shown, 5 images]
[im 19/92  soft-tissue]
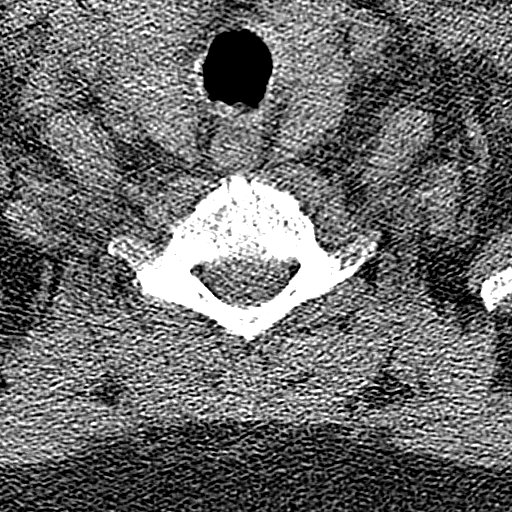
[im 19/92  bone]
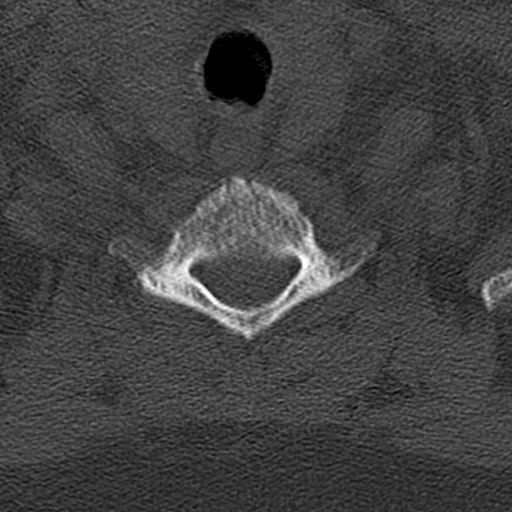
[im 37/92  bone]
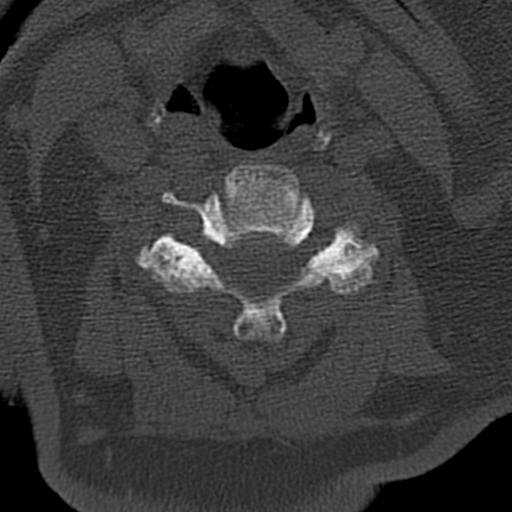
[im 55/92  bone]
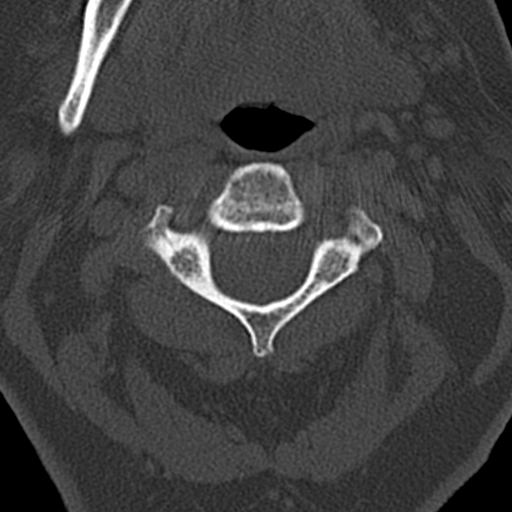
[im 73/92  bone]
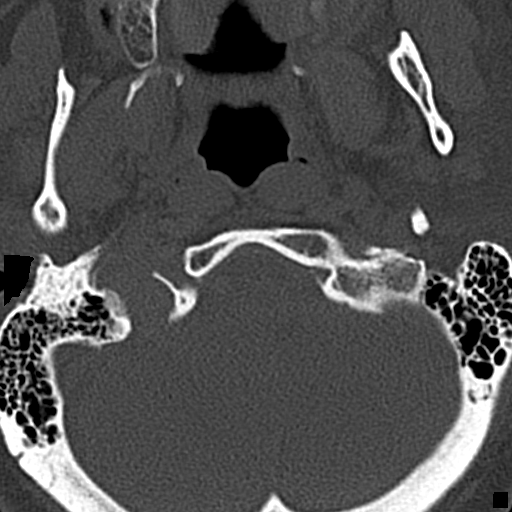

[14 of 33 positions shown; findings below may reference images not displayed]

FINDINGS: CT HEAD FINDINGS

Brain:

There is no intracranial hemorrhage, mass or evidence of acute
infarction. There is moderate generalized atrophy. There is moderate
chronic microvascular ischemic change. There is no significant
extra-axial fluid collection.

No acute intracranial findings are evident.

Vascular: No hyperdense vessel or unexpected calcification.

Skull: Normal. Negative for fracture or focal lesion.

Sinuses/Orbits: No acute finding.

Other: Left frontal scalp hematoma

CT CERVICAL SPINE FINDINGS

Alignment: Normal.

Skull base and vertebrae: No acute fracture. No primary bone lesion
or focal pathologic process.

Soft tissues and spinal canal: No prevertebral fluid or swelling. No
visible canal hematoma.

Disc levels: Moderate degenerative cervical disc disease, greatest
at C6-7. Mild degenerative appearing anterolisthesis at C4-5. Facet
articulations are moderately arthritic but intact.

Upper chest: Negative.

Other: None
IMPRESSION: 1. No acute intracranial findings. There is moderate generalized
atrophy and chronic appearing white matter hypodensities which
likely represent small vessel ischemic disease.
2. Negative for acute cervical spine fracture

## 2017-10-25 ENCOUNTER — Encounter: Payer: Medicare Other | Admitting: Family Medicine

## 2017-10-25 ENCOUNTER — Ambulatory Visit: Payer: Self-pay | Admitting: Family Medicine

## 2017-12-01 ENCOUNTER — Other Ambulatory Visit: Payer: Self-pay | Admitting: Family Medicine

## 2017-12-13 ENCOUNTER — Ambulatory Visit (INDEPENDENT_AMBULATORY_CARE_PROVIDER_SITE_OTHER): Payer: Medicare Other | Admitting: Family Medicine

## 2017-12-13 ENCOUNTER — Encounter: Payer: Self-pay | Admitting: Family Medicine

## 2017-12-13 VITALS — BP 138/64 | HR 68 | Temp 98.4°F | Resp 16 | Ht 62.0 in | Wt 186.0 lb

## 2017-12-13 DIAGNOSIS — Z0000 Encounter for general adult medical examination without abnormal findings: Secondary | ICD-10-CM | POA: Diagnosis not present

## 2017-12-13 NOTE — Progress Notes (Signed)
Patient: Eileen Walsh, Female    DOB: 01/03/1929, 84 y.o.   MRN: [id number] Visit Date: 12/13/2017  Today's Provider: Megan Mansichard Gilbert Jr, MD   Chief Complaint  Patient presents with  . Annual Wellness Exam   Subjective:    Annual wellness visit Eileen PromLucy B Wender is a 84 y.o. female. She feels well. She reports exercising not regularly, but she does stay active. She reports she is sleeping well. She is ok but feel lonely since her husband passed away years asgo.  Colonoscopy- 03/10/2007. Hyperplastic polyps and tubular adenoma. Mammogram- 05/28/2014. Normal.      Review of Systems  Constitutional: Negative.   HENT: Negative.   Eyes: Negative.   Respiratory: Negative.   Cardiovascular: Negative.   Gastrointestinal: Negative.   Endocrine: Negative.   Genitourinary: Negative.   Musculoskeletal: Positive for arthralgias and back pain.  Skin: Negative.   Allergic/Immunologic: Negative.   Neurological: Negative.   Hematological: Negative.   Psychiatric/Behavioral: Negative.     Social History   Socioeconomic History  . Marital status: Widowed    Spouse name: Not on file  . Number of children: Not on file  . Years of education: Not on file  . Highest education level: Not on file  Occupational History  . Not on file  Social Needs  . Financial resource strain: Not on file  . Food insecurity:    Worry: Not on file    Inability: Not on file  . Transportation needs:    Medical: Not on file    Non-medical: Not on file  Tobacco Use  . Smoking status: Never Smoker  . Smokeless tobacco: Never Used  Substance and Sexual Activity  . Alcohol use: Yes    Alcohol/week: 0.6 oz    Types: 1 Glasses of wine per week  . Drug use: No  . Sexual activity: Not on file  Lifestyle  . Physical activity:    Days per week: Not on file    Minutes per session: Not on file  . Stress: Not on file  Relationships  . Social connections:    Talks on phone: Not on file    Gets  together: Not on file    Attends religious service: Not on file    Active member of club or organization: Not on file    Attends meetings of clubs or organizations: Not on file    Relationship status: Not on file  . Intimate partner violence:    Fear of current or ex partner: Not on file    Emotionally abused: Not on file    Physically abused: Not on file    Forced sexual activity: Not on file  Other Topics Concern  . Not on file  Social History Narrative  . Not on file    Past Medical History:  Diagnosis Date  . Arthritis   . Fibromyalgia   . Hypertension   . Macular degeneration      Patient Active Problem List   Diagnosis Date Noted  . Atrial fibrillation with RVR (HCC) 07/16/2016  . Arthritis 11/05/2014  . Back ache 11/05/2014  . Clinical depression 11/05/2014  . Diabetes (HCC) 11/05/2014  . Fibrositis 11/05/2014  . Acid reflux 11/05/2014  . Blood glucose elevated 11/05/2014  . HLD (hyperlipidemia) 11/05/2014  . BP (high blood pressure) 11/05/2014  . Neuropathy 11/05/2014  . Adiposity 11/05/2014  . 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  . Arteriosclerosis of coronary artery 04/06/2014  . Essential (  primary) hypertension 04/06/2014  . 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/2014    Past Surgical History:  Procedure Laterality Date  . ABDOMINAL HYSTERECTOMY  1970  . BREAST SURGERY  1985   Breast Bx    Her family history includes Anxiety disorder in her brother; Arthritis in her brother and mother; Diabetes in her brother; Heart disease in her brother and father; Hyperlipidemia in her mother; Hypertension in her brother and mother; Stroke in her brother.      Current Outpatient Medications:  .  amLODipine-olmesartan (AZOR) 5-40 MG tablet, TAKE 1 TABLET DAILY, Disp: 90 tablet, Rfl: 1 .  Multiple Vitamin (MULTIVITAMIN WITH MINERALS) TABS tablet, Take 1 tablet by mouth daily., Disp: , Rfl:  .  omeprazole (PRILOSEC) 20 MG capsule, TAKE  1 CAPSULE IN THE MORNING, Disp: 90 capsule, Rfl: 3  Patient Care Team: [name]., MD as PCP - General (Family Medicine) Lamar Blinks, MD as Consulting Physician (Cardiology) Nevada Crane, MD as Consulting Physician (Ophthalmology)     Objective:   Vitals: BP 138/64 (BP Location: Left Arm, Patient Position: Sitting, Cuff Size: Normal)   Pulse 68   Temp 98.4 F (36.9 C)   Resp 16   Ht 5\' 2"  (1.575 m)   Wt 186 lb (84.4 kg)   SpO2 98%   BMI 34.02 kg/m   Physical Exam  Activities of Daily Living In your present state of health, do you have any difficulty performing the following activities: 12/13/2017  Hearing? N  Vision? Y  Difficulty concentrating or making decisions? N  Walking or climbing stairs? Y  Dressing or bathing? N  Doing errands, shopping? N  Some recent data might be hidden    Fall Risk Assessment Fall Risk  12/13/2017 12/21/2016 06/16/2016 12/25/2014  Falls in the past year? No Yes Yes No  Number falls in past yr: - 1 2 or more -  Injury with Fall? - Yes No -  Risk for fall due to : - - Impaired balance/gait History of fall(s)  Follow up - - Falls prevention discussed -     Depression Screen PHQ 2/9 Scores 12/13/2017 06/16/2016 12/25/2014  PHQ - 2 Score 0 1 0  PHQ- 9 Score 6 - -    Cognitive Testing - 6-CIT  Correct? Score   What year is it? yes 0 0 or 4  What month is it? yes 0 0 or 3  Memorize:    Eileen Walsh,  44,  [address]  [place],      What time is it? (within 1 hour) yes 0 0 or 3  Count backwards from 20 yes 0 0, 2, or 4  Name the months of the year yes 0 0, 2, or 4  Repeat name & address above yes 0 0, 2, 4, 6, 8, or 10       TOTAL SCORE  0/28   Interpretation:  Normal  Normal (0-7) Abnormal (8-28)       Assessment & Plan:     Annual Wellness Visit  Reviewed patient's Family Medical History Reviewed and updated list of patient's medical providers Assessment of cognitive impairment was done Assessed patient's  functional ability Established a written schedule for health screening services Health Risk Assessent Completed and Reviewed  Exercise Activities and Dietary recommendations Goals    None      Immunization History  Administered Date(s) Administered  . Influenza-Unspecified 03/19/2017  . Pneumococcal Conjugate-13 05/15/2014  . Pneumococcal Polysaccharide-23 04/16/1997  .  Td 06/30/2004    Health Maintenance  Topic Date Due  . FOOT EXAM  03/11/1941  . DEXA SCAN  03/11/1996  . TETANUS/TDAP  06/30/2014  . OPHTHALMOLOGY EXAM  05/15/2017  . HEMOGLOBIN A1C  10/24/2017  . INFLUENZA VACCINE  01/13/2018  . PNA vac Low Risk Adult  Completed     Discussed health benefits of physical activity, and encouraged her to engage in regular exercise appropriate for her age and condition. Patient also has macular degeneration, right side is wet, left side is dry. GERD Try Gaviscon prn.   Richard Wendelyn Breslow, MD  [facility]

## 2017-12-21 ENCOUNTER — Other Ambulatory Visit: Payer: Self-pay | Admitting: Family Medicine

## 2018-04-25 ENCOUNTER — Ambulatory Visit: Payer: Self-pay | Admitting: Family Medicine

## 2018-05-09 ENCOUNTER — Ambulatory Visit (INDEPENDENT_AMBULATORY_CARE_PROVIDER_SITE_OTHER): Payer: Medicare Other | Admitting: Family Medicine

## 2018-05-09 ENCOUNTER — Encounter: Payer: Self-pay | Admitting: Family Medicine

## 2018-05-09 VITALS — BP 179/77 | HR 77 | Resp 16 | Wt 183.0 lb

## 2018-05-09 DIAGNOSIS — I10 Essential (primary) hypertension: Secondary | ICD-10-CM | POA: Diagnosis not present

## 2018-05-09 DIAGNOSIS — K219 Gastro-esophageal reflux disease without esophagitis: Secondary | ICD-10-CM

## 2018-05-09 DIAGNOSIS — M797 Fibromyalgia: Secondary | ICD-10-CM

## 2018-05-09 DIAGNOSIS — E7849 Other hyperlipidemia: Secondary | ICD-10-CM

## 2018-05-09 NOTE — Progress Notes (Signed)
Patient: Eileen Walsh Female    DOB: 07-01-1928   84 y.o.   MRN: [id number] Visit Date: 05/09/2018  Today's Provider: Megan Mansichard Keilly Fatula Jr, MD   Chief Complaint  Patient presents with  . Follow-up   Subjective:    HPI   Hypertension, follow-up:  BP Readings from Last 3 Encounters:  12/13/17 138/64  04/26/17 140/80  12/21/16 124/62    She was last seen for hypertension 1 years ago.  BP at that visit was 140/80. Management since that visit includes; labs checked, no changes.She reports good compliance with treatment. She is not having side effects. none She is exercising. She is adherent to low salt diet.   Outside blood pressures are not checking. She is experiencing none.  Patient denies none.   Cardiovascular risk factors include advanced age (older than 2855 for men, 6665 for women).  Use of agents associated with hypertension: none.  He does occasionally get orthostatic.  Twice she found herself on the floor with no explanation.  No other symptoms.  She did not get injured.  She is not concerned about this and wants no work-up.  ---------------------------------------------------------------    Lipid/Cholesterol, Follow-up:   Last seen for this 1 years ago.  Management since that visit includes; labs checked, no changes.  Last Lipid Panel:    Component Value Date/Time   CHOL 186 07/16/2016 0619   CHOL 268 (H) 06/24/2016 0805   TRIG 45 07/16/2016 0619   HDL 44 07/16/2016 0619   HDL 46 06/24/2016 0805   CHOLHDL 4.2 07/16/2016 0619   VLDL 9 07/16/2016 0619   LDLCALC 133 (H) 07/16/2016 0619   LDLCALC 186 (H) 06/24/2016 0805    She reports good compliance with treatment. She is not having side effects. none  Wt Readings from Last 3 Encounters:  12/13/17 186 lb (84.4 kg)  04/26/17 189 lb (85.7 kg)  12/21/16 192 lb (87.1 kg)    ---------------------------------------------------------------  Gastroesophageal reflux disease, esophagitis presence  not specified From 12/13/2017-given Gaviscon prn  Fibromyalgia From 04/26/2017-no changes.   Atrial fibrillation with RVR (HCC) From 11/12/2018no changes.   Nocturia From 04/26/2017-no changes.  History of hyperglycemia From 04/26/2017-Hemoglobin A1c 5.5. No changes.    Allergies  Allergen Reactions  . Aspirin Other (See Comments)  . Sulfa Antibiotics Hives and Other (See Comments)     Current Outpatient Medications:  .  amLODipine-olmesartan (AZOR) 5-40 MG tablet, TAKE 1 TABLET DAILY, Disp: 90 tablet, Rfl: 1 .  Multiple Vitamin (MULTIVITAMIN WITH MINERALS) TABS tablet, Take 1 tablet by mouth daily., Disp: , Rfl:  .  omeprazole (PRILOSEC) 20 MG capsule, TAKE 1 CAPSULE IN THE MORNING, Disp: 90 capsule, Rfl: 3  Review of Systems  Constitutional: Negative for appetite change, chills, fatigue and fever.  HENT: Negative.   Eyes: Negative.   Respiratory: Negative for chest tightness and shortness of breath.   Cardiovascular: Negative for chest pain and palpitations.  Gastrointestinal: Negative for abdominal pain, nausea and vomiting.  Endocrine: Negative.   Musculoskeletal: Positive for arthralgias.  Allergic/Immunologic: Negative.   Neurological: Negative for dizziness and weakness.  Psychiatric/Behavioral: Negative.     Social History   Tobacco Use  . Smoking status: Never Smoker  . Smokeless tobacco: Never Used  Substance Use Topics  . Alcohol use: Yes    Alcohol/week: 1.0 standard drinks    Types: 1 Glasses of wine per week   Objective:   There were no vitals taken for this visit. There were  no vitals filed for this visit.   Physical Exam  Constitutional: She is oriented to person, place, and time. She appears well-developed and well-nourished.  HENT:  Head: Normocephalic and atraumatic.  Right Ear: External ear normal.  Left Ear: External ear normal.  Nose: Nose normal.  Mouth/Throat: Oropharynx is clear and moist.  Eyes: Conjunctivae are normal. No  scleral icterus.  Cardiovascular: Normal rate, regular rhythm and normal heart sounds.  Pulmonary/Chest: Effort normal and breath sounds normal.  Abdominal: Soft.  Neurological: She is alert and oriented to person, place, and time.  Skin: Skin is warm and dry.  Psychiatric: She has a normal mood and affect. Her behavior is normal. Judgment and thought content normal.        Assessment & Plan:     1. Essential (primary) hypertension Recheck today 152/84.  Due to some mildly symptomatic orthostasis would not increase her regimen.  He states when she checks it at home it is good. - CBC w/Diff/Platelet - Comprehensive Metabolic Panel (CMET) - TSH - CK (Creatine Kinase)  2. Gastroesophageal reflux disease, esophagitis presence not specified Controlled - CBC w/Diff/Platelet - Comprehensive Metabolic Panel (CMET) - TSH - CK (Creatine Kinase)  3. Other hyperlipidemia Patient declines statin.  Consider Zetia. - CBC w/Diff/Platelet - Comprehensive Metabolic Panel (CMET) - TSH - CK (Creatine Kinase)  4. Fibromyalgia She thinks she has fibromyalgia.  I think it is more osteoarthritis.  Return to clinic 6 months. - CBC w/Diff/Platelet - Comprehensive Metabolic Panel (CMET) - TSH - CK (Creatine Kinase)      I have done the exam and reviewed the above chart and it is accurate to the best of my knowledge. Dentist has been used in this note in any air is in the dictation or transcription are unintentional.  [name], MD  [facility]

## 2018-05-10 LAB — COMPREHENSIVE METABOLIC PANEL
A/G RATIO: 1.7 (ref 1.2–2.2)
ALBUMIN: 4.3 g/dL (ref 3.5–4.7)
ALK PHOS: 77 IU/L (ref 39–117)
ALT: 19 IU/L (ref 0–32)
AST: 26 IU/L (ref 0–40)
BILIRUBIN TOTAL: 0.5 mg/dL (ref 0.0–1.2)
BUN / CREAT RATIO: 12 (ref 12–28)
BUN: 14 mg/dL (ref 8–27)
CO2: 21 mmol/L (ref 20–29)
Calcium: 9.6 mg/dL (ref 8.7–10.3)
Chloride: 101 mmol/L (ref 96–106)
Creatinine, Ser: 1.15 mg/dL — ABNORMAL HIGH (ref 0.57–1.00)
GFR calc non Af Amer: 43 mL/min/{1.73_m2} — ABNORMAL LOW (ref >59)
GFR, EST AFRICAN AMERICAN: 49 mL/min/{1.73_m2} — AB (ref >59)
Globulin, Total: 2.6 g/dL (ref 1.5–4.5)
Glucose: 101 mg/dL — ABNORMAL HIGH (ref 65–99)
POTASSIUM: 4.2 mmol/L (ref 3.5–5.2)
Sodium: 138 mmol/L (ref 134–144)
TOTAL PROTEIN: 6.9 g/dL (ref 6.0–8.5)

## 2018-05-10 LAB — CBC WITH DIFFERENTIAL/PLATELET
Basophils Absolute: 0.1 10*3/uL (ref 0.0–0.2)
Basos: 1 %
EOS (ABSOLUTE): 0.2 10*3/uL (ref 0.0–0.4)
EOS: 2 %
HEMOGLOBIN: 15.1 g/dL (ref 11.1–15.9)
Hematocrit: 44.2 % (ref 34.0–46.6)
Immature Grans (Abs): 0 10*3/uL (ref 0.0–0.1)
Immature Granulocytes: 0 %
LYMPHS ABS: 2.9 10*3/uL (ref 0.7–3.1)
Lymphs: 30 %
MCH: 31 pg (ref 26.6–33.0)
MCHC: 34.2 g/dL (ref 31.5–35.7)
MCV: 91 fL (ref 79–97)
Monocytes Absolute: 0.9 10*3/uL (ref 0.1–0.9)
Monocytes: 9 %
NEUTROS ABS: 5.8 10*3/uL (ref 1.4–7.0)
Neutrophils: 58 %
Platelets: 274 10*3/uL (ref 150–450)
RBC: 4.87 x10E6/uL (ref 3.77–5.28)
RDW: 12.4 % (ref 12.3–15.4)
WBC: 9.8 10*3/uL (ref 3.4–10.8)

## 2018-05-10 LAB — TSH: TSH: 2.55 u[IU]/mL (ref 0.450–4.500)

## 2018-05-10 LAB — CK: Total CK: 80 U/L (ref 24–173)

## 2018-06-20 ENCOUNTER — Other Ambulatory Visit: Payer: Self-pay | Admitting: Family Medicine

## 2018-11-15 ENCOUNTER — Encounter: Payer: Self-pay | Admitting: Family Medicine

## 2018-11-15 ENCOUNTER — Other Ambulatory Visit: Payer: Self-pay

## 2018-11-15 ENCOUNTER — Ambulatory Visit (INDEPENDENT_AMBULATORY_CARE_PROVIDER_SITE_OTHER): Payer: Medicare Other | Admitting: Family Medicine

## 2018-11-15 VITALS — BP 128/72 | HR 70 | Temp 98.7°F | Resp 16 | Ht 62.0 in | Wt 181.0 lb

## 2018-11-15 DIAGNOSIS — K219 Gastro-esophageal reflux disease without esophagitis: Secondary | ICD-10-CM | POA: Diagnosis not present

## 2018-11-15 DIAGNOSIS — Z6835 Body mass index (BMI) 35.0-35.9, adult: Secondary | ICD-10-CM

## 2018-11-15 DIAGNOSIS — I10 Essential (primary) hypertension: Secondary | ICD-10-CM

## 2018-11-15 DIAGNOSIS — E7849 Other hyperlipidemia: Secondary | ICD-10-CM | POA: Diagnosis not present

## 2018-11-15 DIAGNOSIS — H353230 Exudative age-related macular degeneration, bilateral, stage unspecified: Secondary | ICD-10-CM

## 2018-11-15 NOTE — Progress Notes (Signed)
Patient: Eileen Walsh Female    DOB: 1929/05/03   85 y.o.   MRN: [id number] Visit Date: 11/15/2018  Today's Provider: Megan Mansichard Gilbert Jr, MD   Chief Complaint  Patient presents with  . Hypertension   Subjective:     HPI  Hypertension, follow-up:  BP Readings from Last 3 Encounters:  11/15/18 128/72  05/09/18 (!) 179/77  12/13/17 138/64    She was last seen for hypertension 6 months ago.  BP at that visit was 179/77. Management since that visit includes no changes. She reports good compliance with treatment. She is not having side effects.  She is not exercising. She is adherent to low salt diet.   Outside blood pressures are not checked often. She is experiencing none.  Patient denies exertional chest pressure/discomfort, lower extremity edema and palpitations.    Weight trend: stable Wt Readings from Last 3 Encounters:  11/15/18 181 lb (82.1 kg)  05/09/18 183 lb (83 kg)  12/13/17 186 lb (84.4 kg)    Current diet: well balanced  Allergies  Allergen Reactions  . Aspirin Other (See Comments)  . Sulfa Antibiotics Hives and Other (See Comments)     Current Outpatient Medications:  .  amLODipine-olmesartan (AZOR) 5-40 MG tablet, TAKE 1 TABLET DAILY, Disp: 90 tablet, Rfl: 4 .  Multiple Vitamin (MULTIVITAMIN WITH MINERALS) TABS tablet, Take 1 tablet by mouth daily., Disp: , Rfl:  .  omeprazole (PRILOSEC) 20 MG capsule, TAKE 1 CAPSULE IN THE MORNING, Disp: 90 capsule, Rfl: 3  Review of Systems  Constitutional: Negative for activity change, appetite change, chills, diaphoresis, fever and unexpected weight change.  HENT: Negative.   Eyes: Negative.   Respiratory: Negative for cough and shortness of breath.   Cardiovascular: Negative for chest pain, palpitations and leg swelling.  Gastrointestinal: Negative.   Endocrine: Negative.   Allergic/Immunologic: Negative.   Neurological: Negative for dizziness, light-headedness and headaches.  Hematological:  Negative.   Psychiatric/Behavioral: Negative for self-injury, sleep disturbance and suicidal ideas. The patient is not nervous/anxious.     Social History   Tobacco Use  . Smoking status: Never Smoker  . Smokeless tobacco: Never Used  Substance Use Topics  . Alcohol use: Yes    Alcohol/week: 1.0 standard drinks    Types: 1 Glasses of wine per week      Objective:   BP 128/72 (BP Location: Right Arm, Patient Position: Sitting, Cuff Size: Normal)   Pulse 70   Temp 98.7 F (37.1 C)   Resp 16   Ht 5\' 2"  (1.575 m)   Wt 181 lb (82.1 kg)   SpO2 96%   BMI 33.11 kg/m  Vitals:   11/15/18 1521  BP: 128/72  Pulse: 70  Resp: 16  Temp: 98.7 F (37.1 C)  SpO2: 96%  Weight: 181 lb (82.1 kg)  Height: 5\' 2"  (1.575 m)     Physical Exam Vitals signs reviewed.  Constitutional:      Appearance: She is well-developed.  HENT:     Head: Normocephalic and atraumatic.     Right Ear: External ear normal.     Left Ear: External ear normal.     Nose: Nose normal.  Eyes:     General: No scleral icterus.    Conjunctiva/sclera: Conjunctivae normal.  Cardiovascular:     Rate and Rhythm: Normal rate and regular rhythm.     Heart sounds: Normal heart sounds.  Pulmonary:     Effort: Pulmonary effort is normal.  Breath sounds: Normal breath sounds.  Abdominal:     Palpations: Abdomen is soft.  Skin:    General: Skin is warm and dry.  Neurological:     General: No focal deficit present.     Mental Status: She is alert and oriented to person, place, and time.  Psychiatric:        Mood and Affect: Mood normal.        Behavior: Behavior normal.        Thought Content: Thought content normal.        Judgment: Judgment normal.         Assessment & Plan     1. Essential (primary) hypertension Controlled with amlodipine and olmesartan.  2. Gastroesophageal reflux disease without esophagitis   3. Class 2 severe obesity due to excess calories with serious comorbidity and body  mass index (BMI) of 35.0 to 35.9 in adult [facility]) With hypertension hyperlipidemia and prediabetes  4. Other hyperlipidemia   5. Bilateral exudative age-related macular degeneration, unspecified stage (HCC) Right is wet left is dry   I have done the exam and reviewed the chart and it is accurate to the best of my knowledge. Dentist has been used and  any errors in dictation or transcription are unintentional. Julieanne Manson M.D. [facility]     [name], MD  [facility]

## 2019-01-02 ENCOUNTER — Other Ambulatory Visit: Payer: Self-pay | Admitting: Family Medicine

## 2019-01-09 ENCOUNTER — Other Ambulatory Visit: Payer: Self-pay

## 2019-01-09 ENCOUNTER — Ambulatory Visit (INDEPENDENT_AMBULATORY_CARE_PROVIDER_SITE_OTHER): Payer: Medicare Other | Admitting: Family Medicine

## 2019-01-09 ENCOUNTER — Telehealth: Payer: Self-pay

## 2019-01-09 ENCOUNTER — Encounter: Payer: Self-pay | Admitting: Family Medicine

## 2019-01-09 VITALS — BP 152/76 | HR 75 | Temp 100.0°F | Resp 18

## 2019-01-09 DIAGNOSIS — R1011 Right upper quadrant pain: Secondary | ICD-10-CM | POA: Diagnosis not present

## 2019-01-09 DIAGNOSIS — K829 Disease of gallbladder, unspecified: Secondary | ICD-10-CM | POA: Diagnosis not present

## 2019-01-09 DIAGNOSIS — R509 Fever, unspecified: Secondary | ICD-10-CM | POA: Diagnosis not present

## 2019-01-09 DIAGNOSIS — R110 Nausea: Secondary | ICD-10-CM

## 2019-01-09 MED ORDER — ONDANSETRON HCL 4 MG PO TABS: 4.0000 mg | ORAL_TABLET | Freq: Four times a day (QID) | ORAL | 0 refills | Status: DC | PRN

## 2019-01-09 MED ORDER — TRAMADOL HCL 50 MG PO TABS: 50.0000 mg | ORAL_TABLET | Freq: Four times a day (QID) | ORAL | 0 refills | Status: DC | PRN

## 2019-01-09 NOTE — Telephone Encounter (Signed)
Opened in error.   Thanks,   -Tzirel Leonor  

## 2019-01-09 NOTE — Progress Notes (Signed)
Patient: Eileen Walsh Female    DOB: 09/21/1928   85 y.o.   MRN: [id number] Visit Date: 01/09/2019  Today's Provider: Wilhemena Durie, MD   Chief Complaint  Patient presents with  . Back Pain   Subjective:     HPI  Patient has been experiencing pain under right breast and right back since this morning. Elevated temperature, nausea, and gas that started this morning. Some dizziness.  She says food might make it worse. No rash,chest pain or cough. No vomiting.,Diarrhea.,heamaturia.  Allergies  Allergen Reactions  . Aspirin Other (See Comments)  . Sulfa Antibiotics Hives and Other (See Comments)     Current Outpatient Medications:  .  amLODipine-olmesartan (AZOR) 5-40 MG tablet, TAKE 1 TABLET DAILY, Disp: 90 tablet, Rfl: 4 .  Multiple Vitamin (MULTIVITAMIN WITH MINERALS) TABS tablet, Take 1 tablet by mouth daily., Disp: , Rfl:  .  omeprazole (PRILOSEC) 20 MG capsule, TAKE 1 CAPSULE IN THE MORNING, Disp: 90 capsule, Rfl: 3  Review of Systems  Constitutional: Positive for fatigue and fever.  HENT: Negative.   Eyes: Negative.   Respiratory: Negative.   Cardiovascular: Negative.   Gastrointestinal: Positive for nausea.  Endocrine: Negative.   Musculoskeletal: Positive for back pain.  Neurological: Positive for weakness and light-headedness.  Psychiatric/Behavioral: Negative.     Social History   Tobacco Use  . Smoking status: Never Smoker  . Smokeless tobacco: Never Used  Substance Use Topics  . Alcohol use: Yes    Alcohol/week: 1.0 standard drinks    Types: 1 Glasses of wine per week      Objective:   BP (!) 152/76 (BP Location: Right Arm, Patient Position: Sitting, Cuff Size: Large)   Pulse 75   Temp 100 F (37.8 C) (Oral)   Resp 18   SpO2 96%  Vitals:   01/09/19 1605  BP: (!) 152/76  Pulse: 75  Resp: 18  Temp: 100 F (37.8 C)  TempSrc: Oral  SpO2: 96%     Physical Exam Constitutional:      Appearance: She is obese.  HENT:   Head: Normocephalic and atraumatic.     Right Ear: External ear normal.     Left Ear: External ear normal.  Eyes:     General: No scleral icterus.    Conjunctiva/sclera: Conjunctivae normal.  Cardiovascular:     Rate and Rhythm: Normal rate and regular rhythm.  Pulmonary:     Effort: Pulmonary effort is normal.     Breath sounds: Normal breath sounds.  Abdominal:     Palpations: Abdomen is soft.     Comments: RUQ tenderness. Positive Murphys sign. No CVAT.  Skin:    General: Skin is warm and dry.  Neurological:     General: No focal deficit present.     Mental Status: She is alert and oriented to person, place, and time.  Psychiatric:        Mood and Affect: Mood normal.        Behavior: Behavior normal.        Thought Content: Thought content normal.        Judgment: Judgment normal.      No results found for any visits on 01/09/19.     Assessment & Plan    1. Right upper quadrant abdominal pain Most likely GB disease.More than 50% 25 minute visit spent in counseling or coordination of care  - US Abdomen Limited RUQ; Future  2. Gallbladder attack Need labs  and US - Lipase - Comp. Metabolic Panel (12) - CBC w/Diff/Platelet  3. Nausea Pt given Zofran and tramdol  4. Fever, unspecified fever cause I do not think this is covid related. No known exposure.     Arland Usery Wendelyn BreslowGilbert Jr, MD  [facility]

## 2019-01-10 ENCOUNTER — Other Ambulatory Visit: Payer: Self-pay

## 2019-01-10 ENCOUNTER — Telehealth: Payer: Self-pay

## 2019-01-10 DIAGNOSIS — R1011 Right upper quadrant pain: Secondary | ICD-10-CM

## 2019-01-10 MED ORDER — TRAMADOL HCL 50 MG PO TABS: 50.0000 mg | ORAL_TABLET | Freq: Three times a day (TID) | ORAL | 0 refills | Status: AC | PRN

## 2019-01-10 NOTE — Telephone Encounter (Signed)
Patient called stating that she was waiting for an appointment for her ultrasound. She is in terrible pain, and needs it done ASAP. I called registration to get her an appointment, the soonest she can get in is Friday August 3, at 8:30 AM. She states that this is unacceptable. Spoke with Dr. Caryn Section about the patient not feeling well and he stated she should go through the emergency room to have ultrasound done quicker. She was very upset, but did state she will go to the emergency room.

## 2019-01-11 LAB — CBC WITH DIFFERENTIAL/PLATELET
Basophils Absolute: 0.1 10*3/uL (ref 0.0–0.2)
Basos: 0 %
EOS (ABSOLUTE): 0.1 10*3/uL (ref 0.0–0.4)
Eos: 0 %
Hematocrit: 44.7 % (ref 34.0–46.6)
Hemoglobin: 14.9 g/dL (ref 11.1–15.9)
Immature Grans (Abs): 0.1 10*3/uL (ref 0.0–0.1)
Immature Granulocytes: 1 %
Lymphocytes Absolute: 0.7 10*3/uL (ref 0.7–3.1)
Lymphs: 4 %
MCH: 31.6 pg (ref 26.6–33.0)
MCHC: 33.3 g/dL (ref 31.5–35.7)
MCV: 95 fL (ref 79–97)
Monocytes Absolute: 1.1 10*3/uL — ABNORMAL HIGH (ref 0.1–0.9)
Monocytes: 7 %
Neutrophils Absolute: 14.8 10*3/uL — ABNORMAL HIGH (ref 1.4–7.0)
Neutrophils: 88 %
Platelets: 251 10*3/uL (ref 150–450)
RBC: 4.72 x10E6/uL (ref 3.77–5.28)
RDW: 12.4 % (ref 11.7–15.4)
WBC: 16.8 10*3/uL — ABNORMAL HIGH (ref 3.4–10.8)

## 2019-01-11 LAB — COMP. METABOLIC PANEL (12)
AST: 231 IU/L — ABNORMAL HIGH (ref 0–40)
Albumin/Globulin Ratio: 1.6 (ref 1.2–2.2)
Albumin: 4.3 g/dL (ref 3.6–4.6)
Alkaline Phosphatase: 176 IU/L — ABNORMAL HIGH (ref 39–117)
BUN/Creatinine Ratio: 15 (ref 12–28)
BUN: 14 mg/dL (ref 8–27)
Bilirubin Total: 5.3 mg/dL — ABNORMAL HIGH (ref 0.0–1.2)
Calcium: 9.6 mg/dL (ref 8.7–10.3)
Chloride: 99 mmol/L (ref 96–106)
Creatinine, Ser: 0.92 mg/dL (ref 0.57–1.00)
GFR calc Af Amer: 65 mL/min/{1.73_m2} (ref >59)
GFR calc non Af Amer: 56 mL/min/{1.73_m2} — ABNORMAL LOW (ref >59)
Globulin, Total: 2.7 g/dL (ref 1.5–4.5)
Glucose: 127 mg/dL — ABNORMAL HIGH (ref 65–99)
Potassium: 4.1 mmol/L (ref 3.5–5.2)
Sodium: 138 mmol/L (ref 134–144)
Total Protein: 7 g/dL (ref 6.0–8.5)

## 2019-01-11 LAB — LIPASE: Lipase: 1781 U/L — ABNORMAL HIGH (ref 14–85)

## 2019-01-11 NOTE — Telephone Encounter (Signed)
Please review

## 2019-01-11 NOTE — Telephone Encounter (Signed)
I spoke to pt  to let her know we could get her in for ultrasound today. She ask that I cancel appointment and states that she is going to [facility] ER today

## 2019-01-12 DIAGNOSIS — K8050 Calculus of bile duct without cholangitis or cholecystitis without obstruction: Secondary | ICD-10-CM | POA: Insufficient documentation

## 2019-01-12 DIAGNOSIS — I10 Essential (primary) hypertension: Secondary | ICD-10-CM | POA: Insufficient documentation

## 2019-01-13 ENCOUNTER — Ambulatory Visit: Payer: Medicare Other

## 2019-01-27 NOTE — Progress Notes (Signed)
error 

## 2019-02-02 ENCOUNTER — Other Ambulatory Visit: Payer: Self-pay

## 2019-02-02 ENCOUNTER — Ambulatory Visit (INDEPENDENT_AMBULATORY_CARE_PROVIDER_SITE_OTHER): Payer: Medicare Other | Admitting: Family Medicine

## 2019-02-02 ENCOUNTER — Encounter: Payer: Self-pay | Admitting: Family Medicine

## 2019-02-02 VITALS — BP 122/66 | HR 77 | Temp 97.5°F | Resp 18 | Wt 174.2 lb

## 2019-02-02 DIAGNOSIS — K219 Gastro-esophageal reflux disease without esophagitis: Secondary | ICD-10-CM

## 2019-02-02 DIAGNOSIS — Z9049 Acquired absence of other specified parts of digestive tract: Secondary | ICD-10-CM

## 2019-02-02 DIAGNOSIS — R110 Nausea: Secondary | ICD-10-CM

## 2019-02-02 DIAGNOSIS — I10 Essential (primary) hypertension: Secondary | ICD-10-CM

## 2019-02-02 NOTE — Progress Notes (Signed)
Patient: Eileen Walsh Female    DOB: 06-Nov-1928   85 y.o.   MRN: [id number] Visit Date: 02/02/2019  Today's Provider: Wilhemena Durie, MD   Chief Complaint  Patient presents with  . Hospitalization Follow-up   Subjective:     HPI  Hospital follow up for gallbladder surgery. Patient states that surgical area is draining yellow fluid. Patient is having diarrhea that started yesterday.  She has done well after ERCP followed by cholecystectomy at [facility]. She is till weak but feels much better.  Allergies  Allergen Reactions  . Aspirin Other (See Comments)  . Sulfa Antibiotics Hives and Other (See Comments)     Current Outpatient Medications:  .  amLODipine-olmesartan (AZOR) 5-40 MG tablet, TAKE 1 TABLET DAILY, Disp: 90 tablet, Rfl: 4 .  Multiple Vitamin (MULTIVITAMIN WITH MINERALS) TABS tablet, Take 1 tablet by mouth daily., Disp: , Rfl:  .  naproxen sodium (ALEVE) 220 MG tablet, Take 220 mg by mouth., Disp: , Rfl:  .  omeprazole (PRILOSEC) 20 MG capsule, TAKE 1 CAPSULE IN THE MORNING, Disp: 90 capsule, Rfl: 3 .  ondansetron (ZOFRAN) 4 MG tablet, Take 1 tablet (4 mg total) by mouth every 6 (six) hours as needed for nausea or vomiting., Disp: 25 tablet, Rfl: 0  Review of Systems  Constitutional: Positive for fatigue. Negative for fever.  HENT: Negative.   Eyes: Negative.   Respiratory: Negative.   Cardiovascular: Negative.   Gastrointestinal: Positive for diarrhea.  Endocrine: Negative.   Genitourinary: Negative.   Allergic/Immunologic: Negative.   Neurological: Positive for numbness.  Hematological: Negative.   Psychiatric/Behavioral: Negative.   All other systems reviewed and are negative.   Social History   Tobacco Use  . Smoking status: Never Smoker  . Smokeless tobacco: Never Used  Substance Use Topics  . Alcohol use: Yes    Alcohol/week: 1.0 standard drinks    Types: 1 Glasses of wine per week      Objective:   BP 122/66 (BP Location: Left  Arm, Patient Position: Sitting, Cuff Size: Normal)   Pulse 77   Temp (!) 97.5 F (36.4 C) (Temporal)   Resp 18   Wt 174 lb 3.2 oz (79 kg)   SpO2 98%   BMI 31.86 kg/m  Vitals:   02/02/19 1444  BP: 122/66  Pulse: 77  Resp: 18  Temp: (!) 97.5 F (36.4 C)  TempSrc: Temporal  SpO2: 98%  Weight: 174 lb 3.2 oz (79 kg)     Physical Exam Vitals signs reviewed.  Constitutional:      Appearance: She is well-developed.  HENT:     Head: Normocephalic and atraumatic.     Right Ear: External ear normal.     Left Ear: External ear normal.     Nose: Nose normal.  Eyes:     General: No scleral icterus.    Conjunctiva/sclera: Conjunctivae normal.  Cardiovascular:     Rate and Rhythm: Normal rate and regular rhythm.     Heart sounds: Normal heart sounds.  Pulmonary:     Effort: Pulmonary effort is normal.     Breath sounds: Normal breath sounds.  Abdominal:     Palpations: Abdomen is soft.  Skin:    General: Skin is warm and dry.  Neurological:     General: No focal deficit present.     Mental Status: She is alert and oriented to person, place, and time.  Psychiatric:        Mood  and Affect: Mood normal.        Behavior: Behavior normal.        Thought Content: Thought content normal.        Judgment: Judgment normal.      No results found for any visits on 02/02/19.     Assessment & Plan    1. Nausea Almost resolved.RTC 2 months. - CBC w/Diff/Platelet - Comp. Metabolic Panel (12)  2. Gastroesophageal reflux disease without esophagitis  - CBC w/Diff/Platelet - Comp. Metabolic Panel (12)  3. Essential hypertension   4. S/P cholecystectomy May need to treat diarrhea with Questran. Will follow . Clinically she is doing well.  More than 50% 25 minute visit spent in counseling or coordination of care-     Megan Mansichard  Jr, MD  [facility]

## 2019-02-03 LAB — COMP. METABOLIC PANEL (12)
AST: 37 IU/L (ref 0–40)
Albumin/Globulin Ratio: 1.7 (ref 1.2–2.2)
Albumin: 4.1 g/dL (ref 3.6–4.6)
Alkaline Phosphatase: 94 IU/L (ref 39–117)
BUN/Creatinine Ratio: 12 (ref 12–28)
BUN: 12 mg/dL (ref 8–27)
Bilirubin Total: 0.7 mg/dL (ref 0.0–1.2)
Calcium: 9.7 mg/dL (ref 8.7–10.3)
Chloride: 101 mmol/L (ref 96–106)
Creatinine, Ser: 0.97 mg/dL (ref 0.57–1.00)
GFR calc Af Amer: 61 mL/min/{1.73_m2} (ref >59)
GFR calc non Af Amer: 53 mL/min/{1.73_m2} — ABNORMAL LOW (ref >59)
Globulin, Total: 2.4 g/dL (ref 1.5–4.5)
Glucose: 93 mg/dL (ref 65–99)
Potassium: 4.7 mmol/L (ref 3.5–5.2)
Sodium: 139 mmol/L (ref 134–144)
Total Protein: 6.5 g/dL (ref 6.0–8.5)

## 2019-02-03 LAB — CBC WITH DIFFERENTIAL/PLATELET
Basophils Absolute: 0.1 10*3/uL (ref 0.0–0.2)
Basos: 1 %
EOS (ABSOLUTE): 0.6 10*3/uL — ABNORMAL HIGH (ref 0.0–0.4)
Eos: 6 %
Hematocrit: 43.4 % (ref 34.0–46.6)
Hemoglobin: 14.6 g/dL (ref 11.1–15.9)
Immature Grans (Abs): 0 10*3/uL (ref 0.0–0.1)
Immature Granulocytes: 0 %
Lymphocytes Absolute: 2.8 10*3/uL (ref 0.7–3.1)
Lymphs: 27 %
MCH: 30.3 pg (ref 26.6–33.0)
MCHC: 33.6 g/dL (ref 31.5–35.7)
MCV: 90 fL (ref 79–97)
Monocytes Absolute: 0.9 10*3/uL (ref 0.1–0.9)
Monocytes: 9 %
Neutrophils Absolute: 5.9 10*3/uL (ref 1.4–7.0)
Neutrophils: 57 %
Platelets: 288 10*3/uL (ref 150–450)
RBC: 4.82 x10E6/uL (ref 3.77–5.28)
RDW: 12.3 % (ref 11.7–15.4)
WBC: 10.3 10*3/uL (ref 3.4–10.8)

## 2019-02-06 ENCOUNTER — Telehealth: Payer: Self-pay

## 2019-02-06 NOTE — Telephone Encounter (Signed)
-----   Message from Jerrol Banana., MD sent at 02/03/2019  8:52 AM EDT ----- Labs back to normal.

## 2019-02-06 NOTE — Telephone Encounter (Signed)
Patient advised as below.  

## 2019-04-28 NOTE — Progress Notes (Signed)
Patient: Eileen Walsh Female    DOB: [birth year]   85 y.o.   MRN: [id number] Visit Date: 04/28/2019  Today's Provider: Wilhemena Durie, MD   No chief complaint on file.  Subjective:     HPI   Patient states she is doing much better than she was on the last visit.  She feels she has totally recovered from her surgery and reports that the nausea is completely gone and she just has a little acid reflux.  Nausea From 02/02/2019-labs checked, almost back to normal.   Gastroesophageal reflux disease without esophagitis From 02/02/2019-labs checked, almost back to normal.   Essential hypertension From 02/02/2019-labs checked, almost back to normal.   S/P cholecystectomy From 02/02/2019-May need to treat diarrhea with Questran. Will follow . Clinically she is doing well.  More than 50% 25 minute visit spent in counseling or coordination of care.   Allergies  Allergen Reactions  . Aspirin Other (See Comments)  . Sulfa Antibiotics Hives and Other (See Comments)     Current Outpatient Medications:  .  amLODipine-olmesartan (AZOR) 5-40 MG tablet, TAKE 1 TABLET DAILY, Disp: 90 tablet, Rfl: 4 .  Multiple Vitamin (MULTIVITAMIN WITH MINERALS) TABS tablet, Take 1 tablet by mouth daily., Disp: , Rfl:  .  naproxen sodium (ALEVE) 220 MG tablet, Take 220 mg by mouth., Disp: , Rfl:  .  omeprazole (PRILOSEC) 20 MG capsule, TAKE 1 CAPSULE IN THE MORNING, Disp: 90 capsule, Rfl: 3 .  ondansetron (ZOFRAN) 4 MG tablet, Take 1 tablet (4 mg total) by mouth every 6 (six) hours as needed for nausea or vomiting., Disp: 25 tablet, Rfl: 0  Review of Systems  Constitutional: Negative for appetite change, chills, fatigue and fever.  HENT: Negative.  Negative for sinus pain and sore throat.   Eyes: Negative.   Respiratory: Negative for chest tightness and shortness of breath.   Cardiovascular: Negative for chest pain and palpitations.  Gastrointestinal: Negative for abdominal pain, nausea  and vomiting.  Musculoskeletal: Positive for arthralgias.  Allergic/Immunologic: Negative.   Neurological: Positive for weakness (age related). Negative for dizziness and headaches.  Psychiatric/Behavioral: Negative.     Social History   Tobacco Use  . Smoking status: Never Smoker  . Smokeless tobacco: Never Used  Substance Use Topics  . Alcohol use: Yes    Alcohol/week: 1.0 standard drinks    Types: 1 Glasses of wine per week      Objective:   There were no vitals taken for this visit. There were no vitals filed for this visit.There is no height or weight on file to calculate BMI. Blood pressure 140/62 today /heart rate 75 Temp 97/weight 175/PO2 98%/BMI 32.01 Physical Exam Vitals signs reviewed.  Constitutional:      Appearance: She is well-developed.  HENT:     Head: Normocephalic and atraumatic.     Right Ear: External ear normal.     Left Ear: External ear normal.     Nose: Nose normal.  Eyes:     General: No scleral icterus.    Conjunctiva/sclera: Conjunctivae normal.  Cardiovascular:     Rate and Rhythm: Normal rate and regular rhythm.     Heart sounds: Normal heart sounds.  Pulmonary:     Effort: Pulmonary effort is normal.     Breath sounds: Normal breath sounds.  Abdominal:     Palpations: Abdomen is soft.  Skin:    General: Skin is warm and dry.  Neurological:  General: No focal deficit present.     Mental Status: She is alert and oriented to person, place, and time.  Psychiatric:        Mood and Affect: Mood normal.        Behavior: Behavior normal.        Thought Content: Thought content normal.        Judgment: Judgment normal.      No results found for any visits on 05/02/19.     Assessment & Plan     1. Nausea Resolved  2. Essential hypertension Controlled on amlodipine and olmesartan  3. S/P cholecystectomy Doing well  4. Essential (primary) hypertension   5. Class 2 severe obesity due to excess calories with serious  comorbidity and body mass index (BMI) of 35.0 to 35.9 in adult [facility]) Hypertension hyperlipidemia and arthritis/GERD  6. Other hyperlipidemia   7. Fibromyalgia    Follow up in 6 months.       Wendelyn Breslow, MD  [facility]

## 2019-05-02 ENCOUNTER — Ambulatory Visit (INDEPENDENT_AMBULATORY_CARE_PROVIDER_SITE_OTHER): Payer: Medicare Other | Admitting: Family Medicine

## 2019-05-02 ENCOUNTER — Other Ambulatory Visit: Payer: Self-pay

## 2019-05-02 VITALS — BP 140/62 | HR 75 | Temp 96.9°F | Wt 175.0 lb

## 2019-05-02 DIAGNOSIS — E66812 Obesity, class 2: Secondary | ICD-10-CM

## 2019-05-02 DIAGNOSIS — I10 Essential (primary) hypertension: Secondary | ICD-10-CM

## 2019-05-02 DIAGNOSIS — R110 Nausea: Secondary | ICD-10-CM | POA: Diagnosis not present

## 2019-05-02 DIAGNOSIS — Z6835 Body mass index (BMI) 35.0-35.9, adult: Secondary | ICD-10-CM

## 2019-05-02 DIAGNOSIS — Z9049 Acquired absence of other specified parts of digestive tract: Secondary | ICD-10-CM

## 2019-05-02 DIAGNOSIS — E7849 Other hyperlipidemia: Secondary | ICD-10-CM

## 2019-05-02 DIAGNOSIS — M797 Fibromyalgia: Secondary | ICD-10-CM

## 2019-05-08 ENCOUNTER — Ambulatory Visit: Payer: Self-pay | Admitting: Family Medicine

## 2019-05-17 ENCOUNTER — Ambulatory Visit: Payer: Self-pay | Admitting: Family Medicine

## 2019-08-08 ENCOUNTER — Other Ambulatory Visit: Payer: Self-pay | Admitting: Family Medicine

## 2019-08-08 MED ORDER — OMEPRAZOLE 20 MG PO CPDR: DELAYED_RELEASE_CAPSULE | ORAL | 3 refills | Status: DC

## 2019-08-08 MED ORDER — AMLODIPINE-OLMESARTAN 5-40 MG PO TABS: 1.0000 | ORAL_TABLET | Freq: Every day | ORAL | 4 refills | Status: DC

## 2019-08-08 NOTE — Telephone Encounter (Signed)
Requested Prescriptions  Pending Prescriptions Disp Refills  . amLODipine-olmesartan (AZOR) 5-40 MG tablet 90 tablet 4    Sig: Take 1 tablet by mouth daily.     Cardiovascular: CCB + ARB Combos Failed - 08/08/2019 10:59 AM      Failed - K in normal range and within 180 days    Potassium  Date Value Ref Range Status  02/02/2019 4.7 3.5 - 5.2 mmol/L Final  12/22/2012 3.6 3.5 - 5.1 mmol/L Final         Failed - Cr in normal range and within 180 days    Creat  Date Value Ref Range Status  04/26/2017 0.91 (H) 0.60 - 0.88 mg/dL Final    Comment:    For patients >42 years of age, the reference limit for Creatinine is approximately 13% higher for people identified as African-American. .    Creatinine, Ser  Date Value Ref Range Status  02/02/2019 0.97 0.57 - 1.00 mg/dL Final         Failed - Last BP in normal range    BP Readings from Last 1 Encounters:  05/02/19 140/62         Passed - Patient is not pregnant      Passed - Valid encounter within last 6 months    Recent Outpatient Visits          3 months ago Nausea   [facility] [name]., MD   6 months ago Nausea   [facility] [name]., MD   7 months ago Right upper quadrant abdominal pain   [facility] [name]., MD   8 months ago Essential (primary) hypertension   [place] [name]., MD   1 year ago Essential (primary) hypertension   [facility] [name]., MD      Future Appointments            In 2 months [name]., MD [facility]           . omeprazole (PRILOSEC) 20 MG capsule 90 capsule 3    Sig: TAKE 1 CAPSULE IN THE MORNING     Gastroenterology: Proton Pump Inhibitors Passed - 08/08/2019 10:59 AM      Passed - Valid encounter within last 12 months    Recent Outpatient Visits          3 months ago Nausea   [facility] [name]., MD   6 months ago Nausea   [facility] [name]., MD   7 months ago Right upper quadrant abdominal pain   [facility] [name]., MD   8 months ago Essential (primary) hypertension   [facility] [name]., MD   1 year ago Essential (primary) hypertension   [facility] [name]., MD      Future Appointments            In 2 months [name]., MD [facility]

## 2019-08-08 NOTE — Telephone Encounter (Signed)
Medication Refill - Medication:  omeprazole (PRILOSEC) 20 MG capsule  amLODipine-olmesartan (AZOR) 5-40 MG tablet  Has the patient contacted their pharmacy? Yes advised to call office. Pt wants a 90 day supply and wants it done asap.   Preferred Pharmacy (with phone number or street name):  [facility] [address] [place], [place]  Agent: Please be advised that RX refills may take up to 3 business days. We ask that you follow-up with your pharmacy.

## 2019-09-07 ENCOUNTER — Telehealth: Payer: Self-pay | Admitting: Family Medicine

## 2019-09-07 DIAGNOSIS — I10 Essential (primary) hypertension: Secondary | ICD-10-CM

## 2019-09-07 DIAGNOSIS — K219 Gastro-esophageal reflux disease without esophagitis: Secondary | ICD-10-CM

## 2019-09-07 MED ORDER — OMEPRAZOLE 20 MG PO CPDR: DELAYED_RELEASE_CAPSULE | ORAL | 0 refills | Status: DC

## 2019-09-07 MED ORDER — AMLODIPINE-OLMESARTAN 5-40 MG PO TABS: 1.0000 | ORAL_TABLET | Freq: Every day | ORAL | 0 refills | Status: DC

## 2019-09-07 MED ORDER — AMLODIPINE-OLMESARTAN 5-40 MG PO TABS: 1.0000 | ORAL_TABLET | Freq: Every day | ORAL | 3 refills | Status: DC

## 2019-09-07 MED ORDER — OMEPRAZOLE 20 MG PO CPDR: DELAYED_RELEASE_CAPSULE | ORAL | 3 refills | Status: DC

## 2019-09-07 NOTE — Telephone Encounter (Signed)
Rxs sent

## 2019-09-07 NOTE — Telephone Encounter (Signed)
Patient wants Omeprazole and Amlodipine RX sent to [place] on [address]  It was called into the wrong [facility] originally.   Also wants 90 days with 3 refills sent to mail in pharmacy [facility] (this changed 06/16/19)  ID [id number]  RX group [id number]

## 2019-10-05 NOTE — Progress Notes (Signed)
Subjective:   Eileen Walsh is a 86 y.o. female who presents for Medicare Annual (Subsequent) preventive examination.    This visit is being conducted through telemedicine due to the COVID-19 pandemic. This patient has given me verbal consent via doximity to conduct this visit, patient states they are participating from their home address. Some vital signs may be absent or patient reported.    Patient identification: identified by name, DOB, and current address  Review of Systems:  N/A  Cardiac Risk Factors include: advanced age (>11men, >57 women);hypertension     Objective:     Vitals: There were no vitals taken for this visit.  There is no height or weight on file to calculate BMI. Unable to obtain vitals due to visit being conducted via telephonically.   Advanced Directives 10/09/2019 07/16/2016 07/15/2016 06/16/2016  Does Patient Have a Medical Advance Directive? Yes Yes No Yes  Type of Estate agent of [place];Living will Healthcare Power of [place];Living will - Living will;Healthcare Power of Attorney  Does patient want to make changes to medical advance directive? - No - Patient declined - -  Copy of Healthcare Power of Attorney in Chart? No - copy requested No - copy requested - Yes  Would patient like information on creating a medical advance directive? - No - Patient declined No - Patient declined -    Tobacco Social History   Tobacco Use  Smoking Status Never Smoker  Smokeless Tobacco Never Used     Counseling given: Not Answered   Clinical Intake:  Pre-visit preparation completed: Yes  Pain : No/denies pain     Nutritional Risks: None Diabetes: Yes  How often do you need to have someone help you when you read instructions, pamphlets, or other written materials from your doctor or pharmacy?: 1 - Never   Diabetes:  Is the patient diabetic?  Yes  If diabetic, was a CBG obtained today?  No  Did the patient bring in their  glucometer from home?  No  How often do you monitor your CBG's? Does not check.   Financial Strains and Diabetes Management:  Are you having any financial strains with the device, your supplies or your medication? N/A Does the patient want to be seen by Chronic Care Management for management of their diabetes?  No  Would the patient like to be referred to a Nutritionist or for Diabetic Management?  No   Diabetic Exams:  Diabetic Eye Exam: Completed this month per pt. Requested records to be faxed to clinic.  Diabetic Foot Exam: Currently due. Pt has been advised about the importance in completing this exam. Note made to follow up on this at next in office apt.    Interpreter Needed?: No  Information entered by :: [facility], LPN  Past Medical History:  Diagnosis Date  . Arthritis   . Fibromyalgia   . Hypertension   . Macular degeneration    Past Surgical History:  Procedure Laterality Date  . ABDOMINAL HYSTERECTOMY  1970  . BREAST SURGERY  1985   Breast Bx  . CHOLECYSTECTOMY     Family History  Problem Relation Age of Onset  . Hypertension Mother   . Arthritis Mother   . Hyperlipidemia Mother   . Heart disease Father   . Hypertension Brother   . Stroke Brother   . Diabetes Brother   . Arthritis Brother   . Heart disease Brother   . Anxiety disorder Brother  severe. had nervous breakdown   Social History   Socioeconomic History  . Marital status: Widowed    Spouse name: Not on file  . Number of children: 3  . Years of education: Not on file  . Highest education level: High school graduate  Occupational History  . Occupation: retired  Tobacco Use  . Smoking status: Never Smoker  . Smokeless tobacco: Never Used  Substance and Sexual Activity  . Alcohol use: Not Currently    Alcohol/week: 0.0 standard drinks  . Drug use: No  . Sexual activity: Not on file  Other Topics Concern  . Not on file  Social History Narrative  . Not on file   Social  Determinants of Health   Financial Resource Strain: Low Risk   . Difficulty of Paying Living Expenses: Not hard at all  Food Insecurity: No Food Insecurity  . Worried About Programme researcher, broadcasting/film/video in the Last Year: Never true  . Ran Out of Food in the Last Year: Never true  Transportation Needs: No Transportation Needs  . Lack of Transportation (Medical): No  . Lack of Transportation (Non-Medical): No  Physical Activity: Inactive  . Days of Exercise per Week: 0 days  . Minutes of Exercise per Session: 0 min  Stress: No Stress Concern Present  . Feeling of Stress : Only a little  Social Connections: Somewhat Isolated  . Frequency of Communication with Friends and Family: More than three times a week  . Frequency of Social Gatherings with Friends and Family: Three times a week  . Attends Religious Services: More than 4 times per year  . Active Member of Clubs or Organizations: No  . Attends Banker Meetings: Never  . Marital Status: Widowed    Outpatient Encounter Medications as of 10/09/2019  Medication Sig  . amLODipine-olmesartan (AZOR) 5-40 MG tablet Take 1 tablet by mouth daily.  . Multiple Vitamin (MULTIVITAMIN WITH MINERALS) TABS tablet Take 1 tablet by mouth daily.  . naproxen sodium (ALEVE) 220 MG tablet Take 220 mg by mouth.  Marland Kitchen omeprazole (PRILOSEC) 20 MG capsule TAKE 1 CAPSULE IN THE MORNING  . ondansetron (ZOFRAN) 4 MG tablet Take 1 tablet (4 mg total) by mouth every 6 (six) hours as needed for nausea or vomiting. (Patient not taking: Reported on 05/02/2019)   No facility-administered encounter medications on file as of 10/09/2019.    Activities of Daily Living In your present state of health, do you have any difficulty performing the following activities: 10/09/2019  Hearing? N  Vision? Y  Comment Due to MD in right eye.  Difficulty concentrating or making decisions? N  Walking or climbing stairs? N  Dressing or bathing? N  Doing errands, shopping? N    Preparing Food and eating ? N  Using the Toilet? N  In the past six months, have you accidently leaked urine? Y  Comment Occasionally when getting up.  Do you have problems with loss of bowel control? N  Managing your Medications? N  Managing your Finances? N  Housekeeping or managing your Housekeeping? N  Some recent data might be hidden    Patient Care Team: [name]., MD as PCP - General (Family Medicine) Lamar Blinks, MD as Consulting Physician (Cardiology) Nevada Crane, MD as Consulting Physician (Ophthalmology)    Assessment:   This is a routine wellness examination for Aarionna.  Exercise Activities and Dietary recommendations Current Exercise Habits: The patient does not participate in regular exercise at present,  Exercise limited by: orthopedic condition(s)  Goals    . Increase water intake     Starting 06/16/2016, I will try to drink 2 glasses of water a day.    Marland Kitchen LIFESTYLE - DECREASE FALLS RISK     Recommend to remove any items from the home that may cause slips or trips.       Fall Risk: Fall Risk  10/09/2019 05/02/2019 12/13/2017 12/21/2016 06/16/2016  Falls in the past year? 1 0 No Yes Yes  Number falls in past yr: 0 0 - 1 2 or more  Injury with Fall? 0 0 - Yes No  Risk for fall due to : Impaired mobility - - - Impaired balance/gait  Follow up Falls prevention discussed - - - Falls prevention discussed    FALL RISK PREVENTION PERTAINING TO THE HOME:  Any stairs in or around the home? Yes  If so, are there any without handrails? No   Home free of loose throw rugs in walkways, pet beds, electrical cords, etc? Yes  Adequate lighting in your home to reduce risk of falls? Yes   ASSISTIVE DEVICES UTILIZED TO PREVENT FALLS:  Life alert? Yes  Use of a cane, walker or w/c? Yes  Grab bars in the bathroom? No  Shower chair or bench in shower? Yes  Elevated toilet seat or a handicapped toilet? Yes   TIMED UP AND GO:  Was the test performed? No  .    Depression Screen PHQ 2/9 Scores 10/09/2019 10/09/2019 11/15/2018 12/13/2017  PHQ - 2 Score 1 0 0 0  PHQ- 9 Score - - - 6     Cognitive Function: Declined today.      6CIT Screen 06/16/2016  What Year? 0 points  What month? 0 points  What time? 0 points  Count back from 20 2 points  Months in reverse 0 points  Repeat phrase 2 points  Total Score 4    Immunization History  Administered Date(s) Administered  . Influenza-Unspecified 03/19/2017, 03/15/2018  . Pneumococcal Conjugate-13 05/15/2014  . Pneumococcal Polysaccharide-23 04/16/1997  . Td 06/30/2004    Qualifies for Shingles Vaccine? Yes . Due for Shingrix. Pt has been advised to call insurance company to determine out of pocket expense. Advised may also receive vaccine at local pharmacy or Health Dept. Verbalized acceptance and understanding.  Tdap: Although this vaccine is not a covered service during a Wellness Exam, does the patient still wish to receive this vaccine today?  No . Advised may receive this vaccine at local pharmacy or Health Dept. Aware to provide a copy of the vaccination record if obtained from local pharmacy or Health Dept. Verbalized acceptance and understanding.  Flu Vaccine: Due fall 2021  Pneumococcal Vaccine: Up to date   Screening Tests Health Maintenance  Topic Date Due  . FOOT EXAM  Never done  . COVID-19 Vaccine (1) Never done  . DEXA SCAN  Never done  . OPHTHALMOLOGY EXAM  05/15/2017  . HEMOGLOBIN A1C  10/24/2017  . TETANUS/TDAP  10/08/2020 (Originally 06/30/2014)  . INFLUENZA VACCINE  01/14/2020  . PNA vac Low Risk Adult  Completed    Cancer Screenings:  Colorectal Screening: No longer required.   Mammogram: No longer required.   Bone Density: Currently due. Ordered today. Pt aware the office will call re: appt.  Lung Cancer Screening: (Low Dose CT Chest recommended if Age 34-80 years, 30 pack-year currently smoking OR have quit w/in 15years.) does not qualify.   Additional  Screening:  Dental  Screening: Recommended annual dental exams for proper oral hygiene   Community Resource Referral:  CRR required this visit?  No       Plan:  I have personally reviewed and addressed the Medicare Annual Wellness questionnaire and have noted the following in the patient's chart:  A. Medical and social history B. Use of alcohol, tobacco or illicit drugs  C. Current medications and supplements D. Functional ability and status E.  Nutritional status F.  Physical activity G. Advance directives H. List of other physicians I.  Hospitalizations, surgeries, and ER visits in previous 12 months J.  Vitals K. Screenings such as hearing and vision if needed, cognitive and depression L. Referrals and appointments   In addition, I have reviewed and discussed with patient certain preventive protocols, quality metrics, and best practice recommendations. A written personalized care plan for preventive services as well as general preventive health recommendations were provided to patient.   Darrick Huntsman, [place]  7/67/3419 Nurse Health Advisor   Nurse Notes: Pt needs a diabetic foot exam and Hgb A1c check at next in office visit. Requested previous eye exam notes to be faxed to clinic.

## 2019-10-09 ENCOUNTER — Ambulatory Visit (INDEPENDENT_AMBULATORY_CARE_PROVIDER_SITE_OTHER): Payer: Medicare Other

## 2019-10-09 ENCOUNTER — Other Ambulatory Visit: Payer: Self-pay

## 2019-10-09 DIAGNOSIS — E2839 Other primary ovarian failure: Secondary | ICD-10-CM | POA: Diagnosis not present

## 2019-10-09 DIAGNOSIS — Z0000 Encounter for general adult medical examination without abnormal findings: Secondary | ICD-10-CM

## 2019-10-09 NOTE — Patient Instructions (Signed)
Eileen Walsh , Thank you for taking time to come for your Medicare Wellness Visit. I appreciate your ongoing commitment to your health goals. Please review the following plan we discussed and let me know if I can assist you in the future.   Screening recommendations/referrals: Colonoscopy: No longer required.  Mammogram: No longer required.  Bone Density: Ordered today. Pt aware office will contact her to schedule apt. Recommended yearly ophthalmology/optometry visit for glaucoma screening and checkup Recommended yearly dental visit for hygiene and checkup  Vaccinations: Influenza vaccine: Due fall 2021 Pneumococcal vaccine: Completed series Tdap vaccine: Pt declines today.  Shingles vaccine: Pt declines today.     Advanced directives: Please bring a copy of your POA (Power of Attorney) and/or Living Will to your next appointment.   Conditions/risks identified: Fall risk prevention discussed today. Recommend to increase water intake to 6-8 8 oz glasses a day.  Next appointment: 10/30/19 @ 1:20 PM with Dr Sullivan Lone    Preventive Care 84 Years and Older, Female Preventive care refers to lifestyle choices and visits with your health care provider that can promote health and wellness. What does preventive care include?  A yearly physical exam. This is also called an annual well check.  Dental exams once or twice a year.  Routine eye exams. Ask your health care provider how often you should have your eyes checked.  Personal lifestyle choices, including:  Daily care of your teeth and gums.  Regular physical activity.  Eating a healthy diet.  Avoiding tobacco and drug use.  Limiting alcohol use.  Practicing safe sex.  Taking low-dose aspirin every day.  Taking vitamin and mineral supplements as recommended by your health care provider. What happens during an annual well check? The services and screenings done by your health care provider during your annual well check will  depend on your age, overall health, lifestyle risk factors, and family history of disease. Counseling  Your health care provider may ask you questions about your:  Alcohol use.  Tobacco use.  Drug use.  Emotional well-being.  Home and relationship well-being.  Sexual activity.  Eating habits.  History of falls.  Memory and ability to understand (cognition).  Work and work Astronomer.  Reproductive health. Screening  You may have the following tests or measurements:  Height, weight, and BMI.  Blood pressure.  Lipid and cholesterol levels. These may be checked every 5 years, or more frequently if you are over 79 years old.  Skin check.  Lung cancer screening. You may have this screening every year starting at age 24 if you have a 30-pack-year history of smoking and currently smoke or have quit within the past 15 years.  Fecal occult blood test (FOBT) of the stool. You may have this test every year starting at age 28.  Flexible sigmoidoscopy or colonoscopy. You may have a sigmoidoscopy every 5 years or a colonoscopy every 10 years starting at age 7.  Hepatitis C blood test.  Hepatitis B blood test.  Sexually transmitted disease (STD) testing.  Diabetes screening. This is done by checking your blood sugar (glucose) after you have not eaten for a while (fasting). You may have this done every 1-3 years.  Bone density scan. This is done to screen for osteoporosis. You may have this done starting at age 22.  Mammogram. This may be done every 1-2 years. Talk to your health care provider about how often you should have regular mammograms. Talk with your health care provider about your test results,  treatment options, and if necessary, the need for more tests. Vaccines  Your health care provider may recommend certain vaccines, such as:  Influenza vaccine. This is recommended every year.  Tetanus, diphtheria, and acellular pertussis (Tdap, Td) vaccine. You may need a  Td booster every 10 years.  Zoster vaccine. You may need this after age 52.  Pneumococcal 13-valent conjugate (PCV13) vaccine. One dose is recommended after age 68.  Pneumococcal polysaccharide (PPSV23) vaccine. One dose is recommended after age 14. Talk to your health care provider about which screenings and vaccines you need and how often you need them. This information is not intended to replace advice given to you by your health care provider. Make sure you discuss any questions you have with your health care provider. Document Released: 06/28/2015 Document Revised: 02/19/2016 Document Reviewed: 04/02/2015 Elsevier Interactive Patient Education  2017 [employer] Prevention in the Home Falls can cause injuries. They can happen to people of all ages. There are many things you can do to make your home safe and to help prevent falls. What can I do on the outside of my home?  Regularly fix the edges of walkways and driveways and fix any cracks.  Remove anything that might make you trip as you walk through a door, such as a raised step or threshold.  Trim any bushes or trees on the path to your home.  Use bright outdoor lighting.  Clear any walking paths of anything that might make someone trip, such as rocks or tools.  Regularly check to see if handrails are loose or broken. Make sure that both sides of any steps have handrails.  Any raised decks and porches should have guardrails on the edges.  Have any leaves, snow, or ice cleared regularly.  Use sand or salt on walking paths during winter.  Clean up any spills in your garage right away. This includes oil or grease spills. What can I do in the bathroom?  Use night lights.  Install grab bars by the toilet and in the tub and shower. Do not use towel bars as grab bars.  Use non-skid mats or decals in the tub or shower.  If you need to sit down in the shower, use a plastic, non-slip stool.  Keep the floor dry. Clean  up any water that spills on the floor as soon as it happens.  Remove soap buildup in the tub or shower regularly.  Attach bath mats securely with double-sided non-slip rug tape.  Do not have throw rugs and other things on the floor that can make you trip. What can I do in the bedroom?  Use night lights.  Make sure that you have a light by your bed that is easy to reach.  Do not use any sheets or blankets that are too big for your bed. They should not hang down onto the floor.  Have a firm chair that has side arms. You can use this for support while you get dressed.  Do not have throw rugs and other things on the floor that can make you trip. What can I do in the kitchen?  Clean up any spills right away.  Avoid walking on wet floors.  Keep items that you use a lot in easy-to-reach places.  If you need to reach something above you, use a strong step stool that has a grab bar.  Keep electrical cords out of the way.  Do not use floor polish or wax that makes floors  slippery. If you must use wax, use non-skid floor wax.  Do not have throw rugs and other things on the floor that can make you trip. What can I do with my stairs?  Do not leave any items on the stairs.  Make sure that there are handrails on both sides of the stairs and use them. Fix handrails that are broken or loose. Make sure that handrails are as long as the stairways.  Check any carpeting to make sure that it is firmly attached to the stairs. Fix any carpet that is loose or worn.  Avoid having throw rugs at the top or bottom of the stairs. If you do have throw rugs, attach them to the floor with carpet tape.  Make sure that you have a light switch at the top of the stairs and the bottom of the stairs. If you do not have them, ask someone to add them for you. What else can I do to help prevent falls?  Wear shoes that:  Do not have high heels.  Have rubber bottoms.  Are comfortable and fit you well.  Are  closed at the toe. Do not wear sandals.  If you use a stepladder:  Make sure that it is fully opened. Do not climb a closed stepladder.  Make sure that both sides of the stepladder are locked into place.  Ask someone to hold it for you, if possible.  Clearly mark and make sure that you can see:  Any grab bars or handrails.  First and last steps.  Where the edge of each step is.  Use tools that help you move around (mobility aids) if they are needed. These include:  Canes.  Walkers.  Scooters.  Crutches.  Turn on the lights when you go into a dark area. Replace any light bulbs as soon as they burn out.  Set up your furniture so you have a clear path. Avoid moving your furniture around.  If any of your floors are uneven, fix them.  If there are any pets around you, be aware of where they are.  Review your medicines with your doctor. Some medicines can make you feel dizzy. This can increase your chance of falling. Ask your doctor what other things that you can do to help prevent falls. This information is not intended to replace advice given to you by your health care provider. Make sure you discuss any questions you have with your health care provider. Document Released: 03/28/2009 Document Revised: 11/07/2015 Document Reviewed: 07/06/2014 Elsevier Interactive Patient Education  2017 [employer].

## 2019-10-24 ENCOUNTER — Ambulatory Visit
Admission: RE | Admit: 2019-10-24 | Discharge: 2019-10-24 | Disposition: A | Discharge status: Home / Self Care | Payer: Medicare Other | Source: Ambulatory Visit | Attending: Family Medicine | Admitting: Family Medicine

## 2019-10-24 DIAGNOSIS — E2839 Other primary ovarian failure: Secondary | ICD-10-CM | POA: Insufficient documentation

## 2019-10-26 NOTE — Progress Notes (Signed)
Inetta Fermo Cummings,acting as a scribe for [name], MD.,have documented all relevant documentation on the behalf of [name], MD,as directed by  [name], MD while in the presence of [name], MD.  Established patient visit   Patient: Eileen Walsh   DOB: 09-11-1928   86 y.o. Female  MRN: [id number] Visit Date: 10/30/2019  Today's healthcare provider: [name], MD   Chief Complaint  Patient presents with  . Hypertension   Subjective    HPI Patient is feeling fairly well.  All of her chronic issues are stable but she complains about the neuropathy symptoms are essentially unchanged.  She feels unsteady while walking because of this but has not fallen recently.  She also complains of for some restless leg syndrome that she thinks is her neuropathy.  She does not think it really requires treatment.  Hypertension, follow-up  BP Readings from Last 3 Encounters:  10/30/19 138/81  05/02/19 140/62  02/02/19 122/66   Wt Readings from Last 3 Encounters:  10/30/19 181 lb 3.2 oz (82.2 kg)  05/02/19 175 lb (79.4 kg)  02/02/19 174 lb 3.2 oz (79 kg)     She was last seen for hypertension 6 months ago.  BP at that visit was 140/62. Management since that visit includes; Controlled on amlodipine and olmesartan She reports excellent compliance with treatment. She is not having side effects.  She is not exercising. She is adherent to low salt diet.   Outside blood pressures are not checking.  She does not smoke.  Use of agents associated with hypertension: NSAIDS.   ---------------------------------------------------------------------------------------------------   Social History   Tobacco Use  . Smoking status: Never Smoker  . Smokeless tobacco: Never Used  Substance Use Topics  . Alcohol use: Not Currently    Alcohol/week: 0.0 standard drinks  . Drug use: No       Medications: Outpatient Medications Prior to Visit    Medication Sig  . amLODipine-olmesartan (AZOR) 5-40 MG tablet Take 1 tablet by mouth daily.  . Multiple Vitamin (MULTIVITAMIN WITH MINERALS) TABS tablet Take 1 tablet by mouth daily.  . naproxen sodium (ALEVE) 220 MG tablet Take 220 mg by mouth.  Marland Kitchen omeprazole (PRILOSEC) 20 MG capsule TAKE 1 CAPSULE IN THE MORNING  . ondansetron (ZOFRAN) 4 MG tablet Take 1 tablet (4 mg total) by mouth every 6 (six) hours as needed for nausea or vomiting. (Patient not taking: Reported on 05/02/2019)   No facility-administered medications prior to visit.    Review of Systems  Constitutional: Negative for appetite change, chills, fatigue and fever.  Eyes: Negative.   Respiratory: Negative for chest tightness and shortness of breath.   Cardiovascular: Negative for chest pain and palpitations.  Gastrointestinal: Negative for abdominal pain, nausea and vomiting.  Endocrine: Negative.   Musculoskeletal: Positive for gait problem.  Skin: Negative.   Allergic/Immunologic: Negative.   Neurological: Positive for numbness. Negative for dizziness and weakness.  Psychiatric/Behavioral: Negative.        Objective    BP 138/81 (BP Location: Left Arm, Patient Position: Sitting, Cuff Size: Large)   Pulse 74   Temp (!) 97.1 F (36.2 C) (Temporal)   Ht 5\' 2"  (1.575 m)   Wt 181 lb 3.2 oz (82.2 kg)   BMI 33.14 kg/m  BP Readings from Last 3 Encounters:  10/30/19 138/81  05/02/19 140/62  02/02/19 122/66   Wt Readings from Last 3 Encounters:  10/30/19 181 lb 3.2 oz (  82.2 kg)  05/02/19 175 lb (79.4 kg)  02/02/19 174 lb 3.2 oz (79 kg)      Physical Exam Vitals reviewed.  Constitutional:      Appearance: She is well-developed. She is obese.  HENT:     Head: Normocephalic and atraumatic.     Right Ear: External ear normal.     Left Ear: External ear normal.     Nose: Nose normal.  Eyes:     General: No scleral icterus.    Conjunctiva/sclera: Conjunctivae normal.  Cardiovascular:     Rate and Rhythm:  Normal rate and regular rhythm.     Heart sounds: Normal heart sounds.  Pulmonary:     Effort: Pulmonary effort is normal.     Breath sounds: Normal breath sounds.  Abdominal:     Palpations: Abdomen is soft.  Skin:    General: Skin is warm and dry.  Neurological:     Mental Status: She is alert and oriented to person, place, and time.  Psychiatric:        Behavior: Behavior normal.        Thought Content: Thought content normal.        Judgment: Judgment normal.       No results found for any visits on 10/30/19.  Assessment & Plan     1. Essential hypertension Controlled on amlodipine and olmesartan - Fe+TIBC+Fer - Vitamin B12 - CBC with Differential/Platelet - TSH - Comprehensive metabolic panel  2. Neuropathy Consider gabapentin. Could try pregabalin as I think she has not tolerated gabapentin in the past. - Fe+TIBC+Fer - Vitamin B12 - CBC with Differential/Platelet - TSH - Comprehensive metabolic panel  3. RLS (restless legs syndrome) Check iron level. Again consider the pregabalin. - Fe+TIBC+Fer - Vitamin B12 - CBC with Differential/Platelet - TSH - Comprehensive metabolic panel  4. Atrial fibrillation with RVR (HCC) Now sinus rhythm  5. Other hyperlipidemia   6. Class 1 obesity due to excess calories with serious comorbidity and body mass index (BMI) of 33.0 to 33.9 in adult With hypertension hyperlipidemia and arthritis.   No follow-ups on file.      I, Wilhemena Durie, MD, have reviewed all documentation for this visit. The documentation on 11/03/19 for the exam, diagnosis, procedures, and orders are all accurate and complete.    Gwen Sarvis Cranford Mon, MD  [facility] [phone] (phone) [phone] (fax)  [place]

## 2019-10-30 ENCOUNTER — Ambulatory Visit (INDEPENDENT_AMBULATORY_CARE_PROVIDER_SITE_OTHER): Payer: Medicare Other | Admitting: Family Medicine

## 2019-10-30 ENCOUNTER — Other Ambulatory Visit: Payer: Self-pay

## 2019-10-30 ENCOUNTER — Encounter: Payer: Self-pay | Admitting: Family Medicine

## 2019-10-30 VITALS — BP 138/81 | HR 74 | Temp 97.1°F | Ht 62.0 in | Wt 181.2 lb

## 2019-10-30 DIAGNOSIS — I4891 Unspecified atrial fibrillation: Secondary | ICD-10-CM

## 2019-10-30 DIAGNOSIS — G2581 Restless legs syndrome: Secondary | ICD-10-CM

## 2019-10-30 DIAGNOSIS — E6609 Other obesity due to excess calories: Secondary | ICD-10-CM

## 2019-10-30 DIAGNOSIS — Z6833 Body mass index (BMI) 33.0-33.9, adult: Secondary | ICD-10-CM

## 2019-10-30 DIAGNOSIS — M1990 Unspecified osteoarthritis, unspecified site: Secondary | ICD-10-CM

## 2019-10-30 DIAGNOSIS — I10 Essential (primary) hypertension: Secondary | ICD-10-CM | POA: Diagnosis not present

## 2019-10-30 DIAGNOSIS — G629 Polyneuropathy, unspecified: Secondary | ICD-10-CM

## 2019-10-30 DIAGNOSIS — E7849 Other hyperlipidemia: Secondary | ICD-10-CM

## 2019-10-31 LAB — COMPREHENSIVE METABOLIC PANEL
ALT: 15 IU/L (ref 0–32)
AST: 25 IU/L (ref 0–40)
Albumin/Globulin Ratio: 1.5 (ref 1.2–2.2)
Albumin: 4.1 g/dL (ref 3.6–4.6)
Alkaline Phosphatase: 73 IU/L (ref 48–121)
BUN/Creatinine Ratio: 12 (ref 12–28)
BUN: 15 mg/dL (ref 8–27)
Bilirubin Total: 0.4 mg/dL (ref 0.0–1.2)
CO2: 20 mmol/L (ref 20–29)
Calcium: 9.6 mg/dL (ref 8.7–10.3)
Chloride: 101 mmol/L (ref 96–106)
Creatinine, Ser: 1.27 mg/dL — ABNORMAL HIGH (ref 0.57–1.00)
GFR calc Af Amer: 44 mL/min/{1.73_m2} — ABNORMAL LOW (ref >59)
GFR calc non Af Amer: 38 mL/min/{1.73_m2} — ABNORMAL LOW (ref >59)
Globulin, Total: 2.7 g/dL (ref 1.5–4.5)
Glucose: 87 mg/dL (ref 65–99)
Potassium: 5.1 mmol/L (ref 3.5–5.2)
Sodium: 137 mmol/L (ref 134–144)
Total Protein: 6.8 g/dL (ref 6.0–8.5)

## 2019-10-31 LAB — CBC WITH DIFFERENTIAL/PLATELET
Basophils Absolute: 0.1 10*3/uL (ref 0.0–0.2)
Basos: 1 %
EOS (ABSOLUTE): 0.2 10*3/uL (ref 0.0–0.4)
Eos: 3 %
Hematocrit: 43.9 % (ref 34.0–46.6)
Hemoglobin: 14.7 g/dL (ref 11.1–15.9)
Immature Grans (Abs): 0 10*3/uL (ref 0.0–0.1)
Immature Granulocytes: 0 %
Lymphocytes Absolute: 2.5 10*3/uL (ref 0.7–3.1)
Lymphs: 29 %
MCH: 30.9 pg (ref 26.6–33.0)
MCHC: 33.5 g/dL (ref 31.5–35.7)
MCV: 92 fL (ref 79–97)
Monocytes Absolute: 0.8 10*3/uL (ref 0.1–0.9)
Monocytes: 9 %
Neutrophils Absolute: 5 10*3/uL (ref 1.4–7.0)
Neutrophils: 58 %
Platelets: 255 10*3/uL (ref 150–450)
RBC: 4.76 x10E6/uL (ref 3.77–5.28)
RDW: 12.5 % (ref 11.7–15.4)
WBC: 8.5 10*3/uL (ref 3.4–10.8)

## 2019-10-31 LAB — IRON,TIBC AND FERRITIN PANEL
Ferritin: 532 ng/mL — ABNORMAL HIGH (ref 15–150)
Iron Saturation: 41 % (ref 15–55)
Iron: 115 ug/dL (ref 27–139)
Total Iron Binding Capacity: 283 ug/dL (ref 250–450)
UIBC: 168 ug/dL (ref 118–369)

## 2019-10-31 LAB — TSH: TSH: 2.28 u[IU]/mL (ref 0.450–4.500)

## 2019-10-31 LAB — VITAMIN B12: Vitamin B-12: 836 pg/mL (ref 232–1245)

## 2019-11-03 ENCOUNTER — Telehealth: Payer: Self-pay

## 2019-11-03 NOTE — Telephone Encounter (Signed)
Patient advised.

## 2019-11-03 NOTE — Telephone Encounter (Signed)
-----   Message from [name]., MD sent at 11/03/2019 10:12 AM EDT ----- Labs stable.  stay hydrated/drink water during the day.

## 2019-12-14 ENCOUNTER — Ambulatory Visit: Payer: Self-pay

## 2019-12-14 NOTE — Progress Notes (Signed)
Virtual telephone visit    Virtual Visit via Telephone Note   This visit type was conducted due to national recommendations for restrictions regarding the COVID-19 Pandemic (e.g. social distancing) in an effort to limit this patient's exposure and mitigate transmission in our community. Due to her co-morbid illnesses, this patient is at least at moderate risk for complications without adequate follow up. This format is felt to be most appropriate for this patient at this time. The patient did not have access to video technology or had technical difficulties with video requiring transitioning to audio format only (telephone). Physical exam was limited to content and character of the telephone converstion.    Patient location: home Provider location: [facility]   Visit Date: 12/15/2019  Today's healthcare provider: Mila Merry, MD   Chief Complaint  Patient presents with  . URI   Subjective     HPI    URI    Associated symptoms inlclude congestion, rhinorrhea, cough, sneezing and sore throat (Weakness ).  Recent episode started in the past 7 days.  The problem has been unchanged since onset.  The temperature has been with in normal range.  Patient  is drinking moderate amounts of fluids.  Past hisotry is significant for  pneumonia.  Patient is not a smoker.       Last edited by Sherre Poot, CMA on 12/15/2019 10:47 AM. (History)      ---------------------------------------------------------------------------------------------------     Medications: Outpatient Medications Prior to Visit  Medication Sig  . amLODipine-olmesartan (AZOR) 5-40 MG tablet Take 1 tablet by mouth daily.  . Multiple Vitamin (MULTIVITAMIN WITH MINERALS) TABS tablet Take 1 tablet by mouth daily.  . naproxen sodium (ALEVE) 220 MG tablet Take 220 mg by mouth.  Marland Kitchen omeprazole (PRILOSEC) 20 MG capsule TAKE 1 CAPSULE IN THE MORNING  . ondansetron (ZOFRAN) 4 MG tablet Take 1 tablet (4 mg total) by mouth  every 6 (six) hours as needed for nausea or vomiting. (Patient not taking: Reported on 05/02/2019)   No facility-administered medications prior to visit.    Review of Systems  Constitutional: Negative.   HENT: Positive for congestion, sneezing and sore throat.   Respiratory: Positive for cough.   Cardiovascular: Negative.   Musculoskeletal: Negative.   Neurological: Positive for weakness.     Objective    There were no vitals taken for this visit.  Awake, alert, oriented x 3. In no apparent distress   Assessment & Plan     1. Upper respiratory tract infection, unspecified type Symptoms c/w common cold. She has had Covid vaccine. She was advised to seek Covid testing if she develops significant fever, cough, or body aches and to seek emergency care if she develops any shortness of breath.    No follow-ups on file.    I discussed the assessment and treatment plan with the patient. The patient was provided an opportunity to ask questions and all were answered. The patient agreed with the plan and demonstrated an understanding of the instructions.   The patient was advised to call back or seek an in-person evaluation if the symptoms worsen or if the condition fails to improve as anticipated.  I provided 8 minutes of non-face-to-face time during this encounter.  The entirety of the information documented in the History of Present Illness, Review of Systems and Physical Exam were personally obtained by me. Portions of this information were initially documented by the CMA and reviewed by me for thoroughness and accuracy.  Lelon Huh, MD [facility] [phone] (phone) [phone] (fax)  [place]

## 2019-12-14 NOTE — Telephone Encounter (Signed)
Patient called and says she's not feeling well. She says she has a cough that started on Monday, runny nose, sore throat from coughing up phlegm that comes up to the throat. She denies fever. She says her chest feels full and it hurts when she coughs. She denies difficulty breathing, but says she's weak when she gets up to walk, so she's staying in the bed mostly. She denies any other symptoms. No availability with PCP. She says she doesn't have the ability to do a video visit. I called the office and spoke to Vernona Rieger, [facility] and asked if the patient could be scheduled tomorrow with Dr. Sherrie Mustache because it defaults to another day for me. She scheduled the patient for tomorrow at 1300 with Dr. Sherrie Mustache as a virtual/telephone visit. Patient advised of appointment, care advice given. She verbalized understanding.   Reason for Disposition  [1] Nasal discharge AND [2] present > 10 days  Answer Assessment - Initial Assessment Questions 1. ONSET: "When did the cough begin?"      Monday 2. SEVERITY: "How bad is the cough today?"      Worse at night 3. SPUTUM: "Describe the color of your sputum" (none, dry cough; clear, white, yellow, green)    Just comes up into throat 4. HEMOPTYSIS: "Are you coughing up any blood?" If so ask: "How much?" (flecks, streaks, tablespoons, etc.)     No 5. DIFFICULTY BREATHING: "Are you having difficulty breathing?" If Yes, ask: "How bad is it?" (e.g., mild, moderate, severe)    - MILD: No SOB at rest, mild SOB with walking, speaks normally in sentences, can lay down, no retractions, pulse < 100.    - MODERATE: SOB at rest, SOB with minimal exertion and prefers to sit, cannot lie down flat, speaks in phrases, mild retractions, audible wheezing, pulse 100-120.    - SEVERE: Very SOB at rest, speaks in single words, struggling to breathe, sitting hunched forward, retractions, pulse > 120      No SOB, just feels weak 6. FEVER: "Do you have a fever?" If Yes, ask: "What is your temperature,  how was it measured, and when did it start?"     No 7. CARDIAC HISTORY: "Do you have any history of heart disease?" (e.g., heart attack, congestive heart failure)      No 8. LUNG HISTORY: "Do you have any history of lung disease?"  (e.g., pulmonary embolus, asthma, emphysema)     No 9. PE RISK FACTORS: "Do you have a history of blood clots?" (or: recent major surgery, recent prolonged travel, bedridden)     No 10. OTHER SYMPTOMS: "Do you have any other symptoms?" (e.g., runny nose, wheezing, chest pain)     Running nose, chest tight when coughing, sore throat from coughing 11. PREGNANCY: "Is there any chance you are pregnant?" "When was your last menstrual period?"       No 12. TRAVEL: "Have you traveled out of the country in the last month?" (e.g., travel history, exposures)       No  Protocols used: COUGH - ACUTE PRODUCTIVE-A-AH

## 2019-12-15 ENCOUNTER — Telehealth (INDEPENDENT_AMBULATORY_CARE_PROVIDER_SITE_OTHER): Payer: Medicare Other | Admitting: Family Medicine

## 2019-12-15 DIAGNOSIS — J069 Acute upper respiratory infection, unspecified: Secondary | ICD-10-CM | POA: Diagnosis not present

## 2019-12-25 ENCOUNTER — Telehealth: Payer: Self-pay

## 2019-12-25 NOTE — Telephone Encounter (Signed)
Please advise 

## 2019-12-25 NOTE — Telephone Encounter (Signed)
Copied from CRM [id number]. Topic: General - Inquiry >> Dec 25, 2019 10:31 AM Daphine Deutscher D wrote: Reason for CRM: Pt called saying she had a telephone visit with Dr. Sherrie Mustache last week for head congestion, cough,no fever, blowing dark congestion.   She feels she needs something sent to the pharmacy.  [facility] [address].  CB# [phone]

## 2019-12-29 NOTE — Telephone Encounter (Signed)
I have no recommendations at this time other than Robitussin and consider getting Covid tested

## 2019-12-29 NOTE — Telephone Encounter (Signed)
Called to advise patient but she said that since she had not heard anything back from PCP [facility] and was [facility] medication there.

## 2020-01-17 ENCOUNTER — Encounter: Payer: Self-pay | Admitting: Family Medicine

## 2020-01-18 ENCOUNTER — Encounter: Payer: Self-pay | Admitting: Family Medicine

## 2020-02-05 NOTE — Progress Notes (Deleted)
Complete physical exam   Patient: Eileen Walsh   DOB: April 20, 1929   86 y.o. Female  MRN: [id number] Visit Date: 02/06/2020  Today's healthcare provider: [name], MD   No chief complaint on file.  Subjective    Eileen Walsh is a 86 y.o. female who presents today for a complete physical exam.  She reports consuming a {diet types:17450} diet. {Exercise:19826} She generally feels {well/fairly well/poorly:18703}. She reports sleeping {well/fairly well/poorly:18703}. She {does/does not:200015} have additional problems to discuss today.   HPI  Patient had AWV with [facility] on 10/09/2019.  Past Medical History:  Diagnosis Date  . Arthritis   . Fibromyalgia   . Hypertension   . Macular degeneration    Past Surgical History:  Procedure Laterality Date  . ABDOMINAL HYSTERECTOMY  1970  . BREAST SURGERY  1985   Breast Bx  . CHOLECYSTECTOMY     Social History   Socioeconomic History  . Marital status: Widowed    Spouse name: Not on file  . Number of children: 3  . Years of education: Not on file  . Highest education level: High school graduate  Occupational History  . Occupation: retired  Tobacco Use  . Smoking status: Never Smoker  . Smokeless tobacco: Never Used  Vaping Use  . Vaping Use: Never used  Substance and Sexual Activity  . Alcohol use: Not Currently    Alcohol/week: 0.0 standard drinks  . Drug use: No  . Sexual activity: Not on file  Other Topics Concern  . Not on file  Social History Narrative  . Not on file   Social Determinants of Health   Financial Resource Strain: Low Risk   . Difficulty of Paying Living Expenses: Not hard at all  Food Insecurity: No Food Insecurity  . Worried About Programme researcher, broadcasting/film/video in the Last Year: Never true  . Ran Out of Food in the Last Year: Never true  Transportation Needs: No Transportation Needs  . Lack of Transportation (Medical): No  . Lack of Transportation (Non-Medical): No  Physical Activity:  Inactive  . Days of Exercise per Week: 0 days  . Minutes of Exercise per Session: 0 min  Stress: No Stress Concern Present  . Feeling of Stress : Only a little  Social Connections: Moderately Isolated  . Frequency of Communication with Friends and Family: More than three times a week  . Frequency of Social Gatherings with Friends and Family: Three times a week  . Attends Religious Services: More than 4 times per year  . Active Member of Clubs or Organizations: No  . Attends Banker Meetings: Never  . Marital Status: Widowed  Intimate Partner Violence: Not At Risk  . Fear of Current or Ex-Partner: No  . Emotionally Abused: No  . Physically Abused: No  . Sexually Abused: No   Family Status  Relation Name Status  . Mother  Deceased at age 35       cause of death Pneumonia  . Father  Deceased at age 27       cause of death, complications after CABG  . Brother  Alive   Family History  Problem Relation Age of Onset  . Hypertension Mother   . Arthritis Mother   . Hyperlipidemia Mother   . Heart disease Father   . Hypertension Brother   . Stroke Brother   . Diabetes Brother   . Arthritis Brother   . Heart disease Brother   .  Anxiety disorder Brother        severe. had nervous breakdown   Allergies  Allergen Reactions  . Aspirin Other (See Comments)  . Sulfa Antibiotics Hives and Other (See Comments)    Patient Care Team: [name]., MD as PCP - General (Family Medicine) Lamar Blinks, MD as Consulting Physician (Cardiology) Nevada Crane, MD as Consulting Physician (Ophthalmology)   Medications: Outpatient Medications Prior to Visit  Medication Sig  . amLODipine-olmesartan (AZOR) 5-40 MG tablet Take 1 tablet by mouth daily.  . Multiple Vitamin (MULTIVITAMIN WITH MINERALS) TABS tablet Take 1 tablet by mouth daily.  . naproxen sodium (ALEVE) 220 MG tablet Take 220 mg by mouth.  Marland Kitchen omeprazole (PRILOSEC) 20 MG capsule TAKE 1 CAPSULE IN THE  MORNING  . ondansetron (ZOFRAN) 4 MG tablet Take 1 tablet (4 mg total) by mouth every 6 (six) hours as needed for nausea or vomiting. (Patient not taking: Reported on 05/02/2019)   No facility-administered medications prior to visit.    Review of Systems  {Heme  Chem  Endocrine  Serology  Results Review (optional):23779::" "}  Objective    There were no vitals taken for this visit. {Show previous vital signs (optional):23777::" "}  Physical Exam  ***  Last depression screening scores PHQ 2/9 Scores 10/09/2019 10/09/2019 11/15/2018  PHQ - 2 Score 1 0 0  PHQ- 9 Score - - -   Last fall risk screening Fall Risk  10/09/2019  Falls in the past year? 1  Number falls in past yr: 0  Injury with Fall? 0  Risk for fall due to : Impaired mobility  Follow up Falls prevention discussed   Last Audit-C alcohol use screening Alcohol Use Disorder Test (AUDIT) 10/09/2019  1. How often do you have a drink containing alcohol? 0  2. How many drinks containing alcohol do you have on a typical day when you are drinking? 0  3. How often do you have six or more drinks on one occasion? 0  AUDIT-C Score 0  Alcohol Brief Interventions/Follow-up AUDIT Score <7 follow-up not indicated   A score of 3 or more in women, and 4 or more in men indicates increased risk for alcohol abuse, EXCEPT if all of the points are from question 1   No results found for any visits on 02/06/20.  Assessment & Plan    Routine Health Maintenance and Physical Exam  Exercise Activities and Dietary recommendations Goals    . Increase water intake     Starting 06/16/2016, I will try to drink 2 glasses of water a day.    Marland Kitchen LIFESTYLE - DECREASE FALLS RISK     Recommend to remove any items from the home that may cause slips or trips.       Immunization History  Administered Date(s) Administered  . Influenza-Unspecified 03/19/2017, 03/15/2018  . Pneumococcal Conjugate-13 05/15/2014  . Pneumococcal Polysaccharide-23  04/16/1997  . Td 06/30/2004    Health Maintenance  Topic Date Due  . FOOT EXAM  Never done  . COVID-19 Vaccine (1) Never done  . OPHTHALMOLOGY EXAM  05/15/2017  . HEMOGLOBIN A1C  10/24/2017  . INFLUENZA VACCINE  01/14/2020  . TETANUS/TDAP  10/08/2020 (Originally 06/30/2014)  . DEXA SCAN  Completed  . PNA vac Low Risk Adult  Completed    Discussed health benefits of physical activity, and encouraged her to engage in regular exercise appropriate for her age and condition.  ***  No follow-ups on file.     {  provider attestation***:1}   Wilhemena Durie, MD  [facility] [phone] (phone) [phone] (fax)  [place]

## 2020-02-06 ENCOUNTER — Encounter: Payer: Self-pay | Admitting: Family Medicine

## 2020-08-06 ENCOUNTER — Encounter: Payer: Self-pay | Admitting: Family Medicine

## 2020-08-06 ENCOUNTER — Other Ambulatory Visit: Payer: Self-pay

## 2020-08-06 ENCOUNTER — Ambulatory Visit (INDEPENDENT_AMBULATORY_CARE_PROVIDER_SITE_OTHER): Payer: Medicare Other | Admitting: Family Medicine

## 2020-08-06 VITALS — BP 156/85 | HR 76 | Temp 98.5°F | Resp 18 | Ht 62.0 in | Wt 178.0 lb

## 2020-08-06 DIAGNOSIS — I4891 Unspecified atrial fibrillation: Secondary | ICD-10-CM | POA: Diagnosis not present

## 2020-08-06 DIAGNOSIS — E7849 Other hyperlipidemia: Secondary | ICD-10-CM | POA: Diagnosis not present

## 2020-08-06 DIAGNOSIS — I10 Essential (primary) hypertension: Secondary | ICD-10-CM | POA: Diagnosis not present

## 2020-08-06 DIAGNOSIS — Z0000 Encounter for general adult medical examination without abnormal findings: Secondary | ICD-10-CM

## 2020-08-06 DIAGNOSIS — K219 Gastro-esophageal reflux disease without esophagitis: Secondary | ICD-10-CM

## 2020-08-06 DIAGNOSIS — G629 Polyneuropathy, unspecified: Secondary | ICD-10-CM

## 2020-08-06 DIAGNOSIS — M797 Fibromyalgia: Secondary | ICD-10-CM

## 2020-08-06 MED ORDER — AMLODIPINE-OLMESARTAN 5-40 MG PO TABS: 1.0000 | ORAL_TABLET | Freq: Every day | ORAL | 3 refills | Status: DC

## 2020-08-06 MED ORDER — OMEPRAZOLE 20 MG PO CPDR: DELAYED_RELEASE_CAPSULE | ORAL | 3 refills | Status: DC

## 2020-08-06 NOTE — Progress Notes (Signed)
I,Eileen Walsh,acting as a scribe for Eileen Mans, MD.,have documented all relevant documentation on the behalf of Eileen Mans, MD,as directed by  Eileen Mans, MD while in the presence of Eileen Mans, MD.   Complete physical exam   Patient: Eileen Walsh   DOB: Jun 24, 1928   87 y.o. Female  MRN: [id number] Visit Date: 08/06/2020  Today's healthcare provider: [name], MD   Chief Complaint  Patient presents with  . Annual Exam   Subjective    Eileen Walsh is a 87 y.o. female who presents today for a complete physical exam.  She reports consuming a general and low sodium diet. The patient does not participate in regular exercise at present. She generally feels well. She reports sleeping fairly well. She does not have additional problems to discuss today.  Patient still lives alone and has had no drops. HPI  Patient had AWV with [facility] on 10/09/2019.  Past Medical History:  Diagnosis Date  . Arthritis   . Fibromyalgia   . Hypertension   . Macular degeneration    Past Surgical History:  Procedure Laterality Date  . ABDOMINAL HYSTERECTOMY  1970  . BREAST SURGERY  1985   Breast Bx  . CHOLECYSTECTOMY     Social History   Socioeconomic History  . Marital status: Widowed    Spouse name: Not on file  . Number of children: 3  . Years of education: Not on file  . Highest education level: High school graduate  Occupational History  . Occupation: retired  Tobacco Use  . Smoking status: Never Smoker  . Smokeless tobacco: Never Used  Vaping Use  . Vaping Use: Never used  Substance and Sexual Activity  . Alcohol use: Not Currently    Alcohol/week: 0.0 standard drinks  . Drug use: No  . Sexual activity: Not on file  Other Topics Concern  . Not on file  Social History Narrative  . Not on file   Social Determinants of Health   Financial Resource Strain: Low Risk   . Difficulty of Paying Living Expenses: Not hard at all  Food  Insecurity: No Food Insecurity  . Worried About Programme researcher, broadcasting/film/video in the Last Year: Never true  . Ran Out of Food in the Last Year: Never true  Transportation Needs: No Transportation Needs  . Lack of Transportation (Medical): No  . Lack of Transportation (Non-Medical): No  Physical Activity: Inactive  . Days of Exercise per Week: 0 days  . Minutes of Exercise per Session: 0 min  Stress: No Stress Concern Present  . Feeling of Stress : Only a little  Social Connections: Moderately Isolated  . Frequency of Communication with Friends and Family: More than three times a week  . Frequency of Social Gatherings with Friends and Family: Three times a week  . Attends Religious Services: More than 4 times per year  . Active Member of Clubs or Organizations: No  . Attends Banker Meetings: Never  . Marital Status: Widowed  Intimate Partner Violence: Not At Risk  . Fear of Current or Ex-Partner: No  . Emotionally Abused: No  . Physically Abused: No  . Sexually Abused: No   Family Status  Relation Name Status  . Mother  Deceased at age 39       cause of death Pneumonia  . Father  Deceased at age 58       cause of death, complications after CABG  .  Brother  Alive   Family History  Problem Relation Age of Onset  . Hypertension Mother   . Arthritis Mother   . Hyperlipidemia Mother   . Heart disease Father   . Hypertension Brother   . Stroke Brother   . Diabetes Brother   . Arthritis Brother   . Heart disease Brother   . Anxiety disorder Brother        severe. had nervous breakdown   Allergies  Allergen Reactions  . Aspirin Other (See Comments)  . Sulfa Antibiotics Hives and Other (See Comments)    Patient Care Team: [name]., MD as PCP - General (Family Medicine) Lamar Blinks, MD as Consulting Physician (Cardiology) Nevada Crane, MD as Consulting Physician (Ophthalmology)   Medications: Outpatient Medications Prior to Visit   Medication Sig  . amLODipine-olmesartan (AZOR) 5-40 MG tablet Take 1 tablet by mouth daily.  . Multiple Vitamin (MULTIVITAMIN WITH MINERALS) TABS tablet Take 1 tablet by mouth daily.  . naproxen sodium (ALEVE) 220 MG tablet Take 220 mg by mouth.  Marland Kitchen omeprazole (PRILOSEC) 20 MG capsule TAKE 1 CAPSULE IN THE MORNING  . ondansetron (ZOFRAN) 4 MG tablet Take 1 tablet (4 mg total) by mouth every 6 (six) hours as needed for nausea or vomiting. (Patient not taking: No sig reported)   No facility-administered medications prior to visit.    Review of Systems  HENT: Positive for postnasal drip, rhinorrhea and tinnitus.   Eyes: Positive for photophobia and itching.  Gastrointestinal: Positive for constipation and diarrhea.  Musculoskeletal: Positive for arthralgias, back pain, gait problem, myalgias and neck pain.  Psychiatric/Behavioral: Positive for sleep disturbance.  All other systems reviewed and are negative.      Objective    BP (!) 156/85 (BP Location: Right Arm, Patient Position: Sitting, Cuff Size: Normal)   Pulse 76   Temp 98.5 F (36.9 C) (Oral)   Resp 18   Ht 5\' 2"  (1.575 m)   Wt 178 lb (80.7 kg)   SpO2 98%   BMI 32.56 kg/m  BP Readings from Last 3 Encounters:  08/06/20 (!) 156/85  10/30/19 138/81  05/02/19 140/62   Wt Readings from Last 3 Encounters:  08/06/20 178 lb (80.7 kg)  10/30/19 181 lb 3.2 oz (82.2 kg)  05/02/19 175 lb (79.4 kg)      Physical Exam Vitals reviewed.  Constitutional:      Appearance: She is well-developed.  HENT:     Head: Normocephalic and atraumatic.     Right Ear: External ear normal.     Left Ear: External ear normal.  Eyes:     Conjunctiva/sclera: Conjunctivae normal.     Pupils: Pupils are equal, round, and reactive to light.  Cardiovascular:     Rate and Rhythm: Normal rate and regular rhythm.     Heart sounds: Normal heart sounds. No murmur heard.   Pulmonary:     Effort: Pulmonary effort is normal. No respiratory  distress.     Breath sounds: Normal breath sounds. No wheezing.  Abdominal:     General: There is no distension.     Tenderness: There is no abdominal tenderness.  Musculoskeletal:        General: No tenderness.     Cervical back: Normal range of motion and neck supple.  Skin:    General: Skin is warm and dry.     Findings: No erythema.  Neurological:     Mental Status: She is alert and oriented to person,  place, and time. Mental status is at baseline.  Psychiatric:        Mood and Affect: Mood normal.        Behavior: Behavior normal.        Thought Content: Thought content normal.        Judgment: Judgment normal.       Last depression screening scores PHQ 2/9 Scores 10/09/2019 10/09/2019 11/15/2018  PHQ - 2 Score 1 0 0  PHQ- 9 Score - - -   Last fall risk screening Fall Risk  10/09/2019  Falls in the past year? 1  Number falls in past yr: 0  Injury with Fall? 0  Risk for fall due to : Impaired mobility  Follow up Falls prevention discussed   Last Audit-C alcohol use screening Alcohol Use Disorder Test (AUDIT) 10/09/2019  1. How often do you have a drink containing alcohol? 0  2. How many drinks containing alcohol do you have on a typical day when you are drinking? 0  3. How often do you have six or more drinks on one occasion? 0  AUDIT-C Score 0  Alcohol Brief Interventions/Follow-up AUDIT Score <7 follow-up not indicated   A score of 3 or more in women, and 4 or more in men indicates increased risk for alcohol abuse, EXCEPT if all of the points are from question 1   No results found for any visits on 08/06/20.  Assessment & Plan    Routine Health Maintenance and Physical Exam  Exercise Activities and Dietary recommendations Goals    . Increase water intake     Starting 06/16/2016, I will try to drink 2 glasses of water a day.    Marland Kitchen LIFESTYLE - DECREASE FALLS RISK     Recommend to remove any items from the home that may cause slips or trips.       Immunization  History  Administered Date(s) Administered  . Influenza-Unspecified 03/19/2017, 03/15/2018  . Pneumococcal Conjugate-13 05/15/2014  . Pneumococcal Polysaccharide-23 04/16/1997  . Td 06/30/2004    Health Maintenance  Topic Date Due  . COVID-19 Vaccine (1) Never done  . FOOT EXAM  Never done  . OPHTHALMOLOGY EXAM  05/15/2017  . HEMOGLOBIN A1C  10/24/2017  . INFLUENZA VACCINE  01/14/2020  . TETANUS/TDAP  10/08/2020 (Originally 06/30/2014)  . DEXA SCAN  Completed  . PNA vac Low Risk Adult  Completed    Discussed health benefits of physical activity, and encouraged her to engage in regular exercise appropriate for her age and condition.  1. Annual physical exam Continued  attention to diet and exercise stressed with patient.  2. Essential hypertension  - CBC w/Diff/Platelet - Comprehensive Metabolic Panel (CMET) - amLODipine-olmesartan (AZOR) 5-40 MG tablet; Take 1 tablet by mouth daily.  Dispense: 90 tablet; Refill: 3  3. Other hyperlipidemia  - Lipid panel - TSH - Comprehensive Metabolic Panel (CMET)  4. Atrial fibrillation with RVR (HCC)   5. Neuropathy   6. Fibromyalgia   7. Gastroesophageal reflux disease without esophagitis  - omeprazole (PRILOSEC) 20 MG capsule; TAKE 1 CAPSULE IN THE MORNING  Dispense: 90 capsule; Refill: 3 8.  Macular degeneration of the right eye  No follow-ups on file.     I, Eileen Mans, MD, have reviewed all documentation for this visit. The documentation on 08/10/20 for the exam, diagnosis, procedures, and orders are all accurate and complete.    Richard Wendelyn Breslow, MD  [facility] [phone] (phone) [phone] (fax)  [facility]

## 2021-01-15 LAB — CBC WITH DIFFERENTIAL/PLATELET
Basophils Absolute: 0.1 10*3/uL (ref 0.0–0.2)
Basos: 1 %
EOS (ABSOLUTE): 0.2 10*3/uL (ref 0.0–0.4)
Eos: 4 %
Hematocrit: 42.7 % (ref 34.0–46.6)
Hemoglobin: 14.4 g/dL (ref 11.1–15.9)
Immature Grans (Abs): 0 10*3/uL (ref 0.0–0.1)
Immature Granulocytes: 0 %
Lymphocytes Absolute: 2.1 10*3/uL (ref 0.7–3.1)
Lymphs: 32 %
MCH: 30.8 pg (ref 26.6–33.0)
MCHC: 33.7 g/dL (ref 31.5–35.7)
MCV: 91 fL (ref 79–97)
Monocytes Absolute: 0.6 10*3/uL (ref 0.1–0.9)
Monocytes: 9 %
Neutrophils Absolute: 3.7 10*3/uL (ref 1.4–7.0)
Neutrophils: 54 %
Platelets: 267 10*3/uL (ref 150–450)
RBC: 4.67 x10E6/uL (ref 3.77–5.28)
RDW: 12 % (ref 11.7–15.4)
WBC: 6.8 10*3/uL (ref 3.4–10.8)

## 2021-01-15 LAB — COMPREHENSIVE METABOLIC PANEL
ALT: 13 IU/L (ref 0–32)
AST: 19 IU/L (ref 0–40)
Albumin/Globulin Ratio: 1.6 (ref 1.2–2.2)
Albumin: 4.1 g/dL (ref 3.6–4.6)
Alkaline Phosphatase: 69 IU/L (ref 44–121)
BUN/Creatinine Ratio: 17 (ref 12–28)
BUN: 19 mg/dL (ref 8–27)
Bilirubin Total: 0.4 mg/dL (ref 0.0–1.2)
CO2: 22 mmol/L (ref 20–29)
Calcium: 9.7 mg/dL (ref 8.7–10.3)
Chloride: 101 mmol/L (ref 96–106)
Creatinine, Ser: 1.1 mg/dL — ABNORMAL HIGH (ref 0.57–1.00)
Globulin, Total: 2.6 g/dL (ref 1.5–4.5)
Glucose: 107 mg/dL — ABNORMAL HIGH (ref 65–99)
Potassium: 4.5 mmol/L (ref 3.5–5.2)
Sodium: 137 mmol/L (ref 134–144)
Total Protein: 6.7 g/dL (ref 6.0–8.5)
eGFR: 48 mL/min/{1.73_m2} — ABNORMAL LOW (ref >59)

## 2021-01-15 LAB — LIPID PANEL
Chol/HDL Ratio: 6.7 ratio — ABNORMAL HIGH (ref 0.0–4.4)
Cholesterol, Total: 302 mg/dL — ABNORMAL HIGH (ref 100–199)
HDL: 45 mg/dL (ref >39)
LDL Chol Calc (NIH): 220 mg/dL — ABNORMAL HIGH (ref 0–99)
Triglycerides: 190 mg/dL — ABNORMAL HIGH (ref 0–149)
VLDL Cholesterol Cal: 37 mg/dL (ref 5–40)

## 2021-01-15 LAB — TSH: TSH: 2.53 u[IU]/mL (ref 0.450–4.500)

## 2021-02-03 ENCOUNTER — Encounter: Payer: Self-pay | Admitting: Family Medicine

## 2021-02-03 ENCOUNTER — Other Ambulatory Visit: Payer: Self-pay

## 2021-02-03 ENCOUNTER — Ambulatory Visit (INDEPENDENT_AMBULATORY_CARE_PROVIDER_SITE_OTHER): Payer: Medicare Other | Admitting: Family Medicine

## 2021-02-03 VITALS — BP 148/81 | HR 88 | Resp 18 | Ht 62.0 in | Wt 178.0 lb

## 2021-02-03 DIAGNOSIS — I4891 Unspecified atrial fibrillation: Secondary | ICD-10-CM | POA: Diagnosis not present

## 2021-02-03 DIAGNOSIS — L255 Unspecified contact dermatitis due to plants, except food: Secondary | ICD-10-CM | POA: Diagnosis not present

## 2021-02-03 DIAGNOSIS — E7849 Other hyperlipidemia: Secondary | ICD-10-CM

## 2021-02-03 DIAGNOSIS — I10 Essential (primary) hypertension: Secondary | ICD-10-CM | POA: Diagnosis not present

## 2021-02-03 DIAGNOSIS — L509 Urticaria, unspecified: Secondary | ICD-10-CM

## 2021-02-03 DIAGNOSIS — H353131 Nonexudative age-related macular degeneration, bilateral, early dry stage: Secondary | ICD-10-CM

## 2021-02-03 DIAGNOSIS — E1142 Type 2 diabetes mellitus with diabetic polyneuropathy: Secondary | ICD-10-CM

## 2021-02-03 DIAGNOSIS — Z6835 Body mass index (BMI) 35.0-35.9, adult: Secondary | ICD-10-CM

## 2021-02-03 DIAGNOSIS — K219 Gastro-esophageal reflux disease without esophagitis: Secondary | ICD-10-CM

## 2021-02-03 MED ORDER — PREDNISONE 5 MG PO TABS: ORAL_TABLET | ORAL | 0 refills | Status: DC

## 2021-02-03 NOTE — Progress Notes (Signed)
I,[name],acting as a scribe for [name], MD.,have documented all relevant documentation on the behalf of [name], MD,as directed by  [name], MD while in the presence of [name], MD.  Established patient visit   Patient: Eileen Walsh   DOB: 1928/09/18   87 y.o. Female  MRN: [id number] Visit Date: 02/03/2021  Today's healthcare provider: [name], MD   Chief Complaint  Patient presents with   Follow-up   Hypertension   Gastroesophageal Reflux   Hyperlipidemia   Subjective  --------------------------------------------------------------------------------------  HPI  Patient present today for follow-up.  She has had a rash for several weeks that she thinks may be secondary to eating tomatoes. Hypertension, follow-up  BP Readings from Last 3 Encounters:  02/03/21 (!) 148/81  08/06/20 (!) 156/85  10/30/19 138/81   Wt Readings from Last 3 Encounters:  02/03/21 178 lb (80.7 kg)  08/06/20 178 lb (80.7 kg)  10/30/19 181 lb 3.2 oz (82.2 kg)     She was last seen for hypertension 6 months ago.  BP at that visit was 156/85. Management since that visit includes no medication changes. Continue to monitor Bp at home.   She reports good compliance with treatment. She is not having side effects. none She is following a Regular diet. She is not exercising. She does not smoke.  Use of agents associated with hypertension: none.   Outside blood pressures are not checking.  Pertinent labs: Lab Results  Component Value Date   CHOL 302 (H) 01/14/2021   HDL 45 01/14/2021   LDLCALC 220 (H) 01/14/2021   TRIG 190 (H) 01/14/2021   CHOLHDL 6.7 (H) 01/14/2021   Lab Results  Component Value Date   NA 137 01/14/2021   K 4.5 01/14/2021   CREATININE 1.10 (H) 01/14/2021   GFRNONAA 38 (L) 10/30/2019   GFRAA 44 (L) 10/30/2019   GLUCOSE 107 (H) 01/14/2021     The ASCVD Risk score (Goff DC Jr., et al., 2013) failed to  calculate for the following reasons:   The 2013 ASCVD risk score is only valid for ages 56 to 57   --------------------------------------------------------------------------------------  Lipid/Cholesterol, Follow-up  Last lipid panel Other pertinent labs  Lab Results  Component Value Date   CHOL 302 (H) 01/14/2021   HDL 45 01/14/2021   LDLCALC 220 (H) 01/14/2021   TRIG 190 (H) 01/14/2021   CHOLHDL 6.7 (H) 01/14/2021   Lab Results  Component Value Date   ALT 13 01/14/2021   AST 19 01/14/2021   PLT 267 01/14/2021   TSH 2.530 01/14/2021     She was last seen for this 6 months ago.  Management since that visit includes no medication changes.  She reports good compliance with treatment. She is not having side effects. none    GERD, Follow up:  The patient was last seen for GERD 6 months ago. Changes made since that visit include no medication changes. Continue Omeprazole 20mg  daily.   She reports good compliance with treatment. She is not having side effects. none.  She IS experiencing  doing well . She is NOT experiencing  n/a  --------------------------------------------------------------------------------------       Medications: Outpatient Medications Prior to Visit  Medication Sig   amLODipine-olmesartan (AZOR) 5-40 MG tablet Take 1 tablet by mouth daily.   Multiple Vitamin (MULTIVITAMIN WITH MINERALS) TABS tablet Take 1 tablet by mouth daily.   naproxen sodium (ALEVE) 220 MG tablet Take 220 mg by  mouth.   omeprazole (PRILOSEC) 20 MG capsule TAKE 1 CAPSULE IN THE MORNING   [DISCONTINUED] ondansetron (ZOFRAN) 4 MG tablet Take 1 tablet (4 mg total) by mouth every 6 (six) hours as needed for nausea or vomiting. (Patient not taking: No sig reported)   No facility-administered medications prior to visit.    Review of Systems  Constitutional:  Negative for appetite change, chills, fatigue and fever.  Respiratory:  Negative for chest tightness and shortness of  breath.   Cardiovascular:  Negative for chest pain and palpitations.  Gastrointestinal:  Negative for abdominal pain, nausea and vomiting.  Neurological:  Negative for dizziness and weakness.       Objective  -------------------------------------------------------------------------------------------------------------------- BP (!) 148/81 (BP Location: Left Arm, Patient Position: Sitting, Cuff Size: Large)   Pulse 88   Resp 18   Ht 5\' 2"  (1.575 m)   Wt 178 lb (80.7 kg)   SpO2 97%   BMI 32.56 kg/m  BP Readings from Last 3 Encounters:  02/03/21 (!) 148/81  08/06/20 (!) 156/85  10/30/19 138/81   Wt Readings from Last 3 Encounters:  02/03/21 178 lb (80.7 kg)  08/06/20 178 lb (80.7 kg)  10/30/19 181 lb 3.2 oz (82.2 kg)       Physical Exam Vitals reviewed.  Constitutional:      Appearance: She is well-developed. She is obese.  HENT:     Head: Normocephalic and atraumatic.     Right Ear: External ear normal.     Left Ear: External ear normal.     Nose: Nose normal.  Eyes:     General: No scleral icterus.    Conjunctiva/sclera: Conjunctivae normal.  Cardiovascular:     Rate and Rhythm: Normal rate and regular rhythm.     Heart sounds: Normal heart sounds.  Pulmonary:     Effort: Pulmonary effort is normal.     Breath sounds: Normal breath sounds.  Abdominal:     Palpations: Abdomen is soft.  Skin:    General: Skin is warm and dry.  Neurological:     Mental Status: She is alert and oriented to person, place, and time.  Psychiatric:        Behavior: Behavior normal.        Thought Content: Thought content normal.        Judgment: Judgment normal.      No results found for any visits on 02/03/21.  Assessment & Plan  ----------------------------------------------------------------------------------------------------------------------  1. Essential hypertension Fair control on Azor  2. Urticaria Thinks it this is secondary to tomatoes.  We will avoid colitis.  Try  prednisone 5 mg 12-day taper - predniSONE (DELTASONE) 5 MG tablet; 12 day taper  Dispense: 42 tablet; Refill: 0  3. Rhus dermatitis Her daughter thinks it is Rous dermatitis.  Does not look like Rous at all but will treat expectantly as it is not inappropriate to use prednisone at this time - predniSONE (DELTASONE) 5 MG tablet; 12 day taper  Dispense: 42 tablet; Refill: 0  4. Essential (primary) hypertension   5. Atrial fibrillation with RVR (HCC) No problems in recent years.  6. Gastroesophageal reflux disease without esophagitis On omeprazole  7. Type 2 diabetes mellitus with diabetic polyneuropathy, without long-term current use of insulin (HCC) Well-controlled with last A1c of 5.8. Neuropathy has been chronic 8. Other hyperlipidemia   9. Class 2 severe obesity due to excess calories with serious comorbidity and body mass index (BMI) of 35.0 to 35.9 in adult (HCC)   10.  Early dry stage nonexudative age-related macular degeneration of both eyes      I, [name], MD, have reviewed all documentation for this visit. The documentation on 02/09/21 for the exam, diagnosis, procedures, and orders are all accurate and complete.    Return in about 6 months (around 08/07/2021).      I, [name], MD, have reviewed all documentation for this visit. The documentation on 02/09/21 for the exam, diagnosis, procedures, and orders are all accurate and complete.    Seger Jani Wendelyn Breslow, MD  [facility] [phone] (phone) [phone] (fax)  [facility]

## 2021-02-03 NOTE — Patient Instructions (Signed)
Increase Allergia to 2-3 times daily.

## 2021-02-25 ENCOUNTER — Telehealth: Payer: Self-pay

## 2021-02-25 NOTE — Telephone Encounter (Signed)
Copied from CRM [id number]. Topic: Appointment Scheduling - Scheduling Inquiry for Clinic >> Feb 25, 2021  4:38 PM Randol Kern wrote: Reason for CRM: Pt called to report that her problem has "manifested" -when I attempted to inquire more information to perhaps connect her to triage she very rudely asked who I was and demeaned my position as non clinical staff then disconnected the call.   Best contact: [phone]

## 2021-02-26 NOTE — Telephone Encounter (Signed)
Patient reports that her rash is now located all over her torso. She reports that it is starting to spread to her legs. She reports that she will be seen at the urgent care today.

## 2021-05-06 ENCOUNTER — Ambulatory Visit (INDEPENDENT_AMBULATORY_CARE_PROVIDER_SITE_OTHER): Payer: Medicare Other | Admitting: Dermatology

## 2021-05-06 ENCOUNTER — Other Ambulatory Visit: Payer: Self-pay

## 2021-05-06 DIAGNOSIS — L299 Pruritus, unspecified: Secondary | ICD-10-CM

## 2021-05-06 DIAGNOSIS — L308 Other specified dermatitis: Secondary | ICD-10-CM | POA: Diagnosis not present

## 2021-05-06 DIAGNOSIS — L309 Dermatitis, unspecified: Secondary | ICD-10-CM

## 2021-05-06 MED ORDER — CLOBETASOL PROPIONATE 0.05 % EX SOLN: 1.0000 "application " | Freq: Two times a day (BID) | CUTANEOUS | 1 refills | Status: DC

## 2021-05-06 NOTE — Progress Notes (Signed)
   New Patient Visit  Subjective  Eileen Walsh is a 87 y.o. female who presents for the following: Rash (Back, sides, buttocks, 69m, itchy, painful, has had prednisone x 3 course, improved on prednisone but then flared again, benadryl cream, otc HC cream). Uses Castile soap.   The following portions of the chart were reviewed this encounter and updated as appropriate:       Review of Systems:  No other skin or systemic complaints except as noted in HPI or Assessment and Plan.  Objective  Well appearing patient in no apparent distress; mood and affect are within normal limits.  A focused examination was performed including back, sides, buttocks. Relevant physical exam findings are noted in the Assessment and Plan.  back, bil flanks, buttocks Pink scaly paps and patches with xerosis and excoriations back, flanks, buttocks   Assessment & Plan  Dermatitis back, bil flanks, buttocks  Asteatotic, with pruritus, chronic  Recommend mild soap and moisturizer, Dove body wash D/c Castile soap Start Clobetasol/cerave mix bid to aa rash trunk until clear Cont Allegra 1 po qam Start Zyrtec 10 mg Po qpm  Recommend OTC Gold Bond Rapid Relief Anti-Itch cream (pramoxine + menthol), CeraVe Anti-itch cream or lotion (pramoxine), Sarna lotion (Original- menthol + camphor or Sensitive- pramoxine) or Eucerin 12 hour Itch Relief lotion (menthol) up to 3 times per day to areas on body that are itchy.    clobetasol (TEMOVATE) 0.05 % external solution - back, bil flanks, buttocks Apply 1 application topically 2 (two) times daily. Mix 1 bottle into 1 tub of cerave cream and use qd/bid aa rash on back, sides, buttocks until clear  Return in about 3 weeks (around 05/27/2021) for Dermatitis.   I, Ardis Rowan, RMA, am acting as scribe for Willeen Niece, MD .  Documentation: I have reviewed the above documentation for accuracy and completeness, and I agree with the above.  Willeen Niece MD

## 2021-05-06 NOTE — Patient Instructions (Addendum)
If You Need Anything After Your Visit  If you have any questions or concerns for your doctor, please call our main line at [phone] and press option 4 to reach your doctor's medical assistant. If no one answers, please leave a voicemail as directed and we will return your call as soon as possible. Messages left after 4 pm will be answered the following business day.   You may also send us a message via MyChart. We typically respond to MyChart messages within 1-2 business days.  For prescription refills, please ask your pharmacy to contact our office. Our fax number is [phone].  If you have an urgent issue when the clinic is closed that cannot wait until the next business day, you can page your doctor at the number below.    Please note that while we do our best to be available for urgent issues outside of office hours, we are not available 24/7.   If you have an urgent issue and are unable to reach us, you may choose to seek medical care at your doctor's office, retail clinic, urgent care center, or emergency room.  If you have a medical emergency, please immediately call 911 or go to the emergency department.  Pager Numbers  - Dr. Kowalski: [phone]  - Dr. Moye: [phone]  - Dr. Stewart: [phone]  In the event of inclement weather, please call our main line at [phone] for an update on the status of any delays or closures.  Dermatology Medication Tips: Please keep the boxes that topical medications come in in order to help keep track of the instructions about where and how to use these. Pharmacies typically print the medication instructions only on the boxes and not directly on the medication tubes.   If your medication is too expensive, please contact our office at [phone] option 4 or send us a message through MyChart.   We are unable to tell what your co-pay for medications will be in advance as this is different depending on your insurance coverage.  However, we may be able to find a substitute medication at lower cost or fill out paperwork to get insurance to cover a needed medication.   If a prior authorization is required to get your medication covered by your insurance company, please allow us 1-2 business days to complete this process.  Drug prices often vary depending on where the prescription is filled and some pharmacies may offer cheaper prices.  The website www.goodrx.com contains coupons for medications through different pharmacies. The prices here do not account for what the cost may be with help from insurance (it may be cheaper with your insurance), but the website can give you the price if you did not use any insurance.  - You can print the associated coupon and take it with your prescription to the pharmacy.  - You may also stop by our office during regular business hours and pick up a GoodRx coupon card.  - If you need your prescription sent electronically to a different pharmacy, notify our office through [place] MyChart or by phone at [phone] option 4.     Si Usted Necesita Algo Despus de Su Visita  Tambin puede enviarnos un mensaje a travs de MyChart. Por lo general respondemos a los mensajes de MyChart en el transcurso de 1 a 2 das hbiles.  Para renovar recetas, por favor pida a su farmacia que se ponga en contacto con nuestra oficina. Nuestro nmero de fax es el [phone].  Si tiene   un asunto urgente cuando la clnica est cerrada y que no puede esperar hasta el siguiente da hbil, puede llamar/localizar a su doctor(a) al nmero que aparece a continuacin.   Por favor, tenga en cuenta que aunque hacemos todo lo posible para estar disponibles para asuntos urgentes fuera del horario de [place], no estamos disponibles las 24 horas del da, los 7 das de la [place].   Si tiene un problema urgente y no puede comunicarse con nosotros, puede optar por buscar atencin mdica  en el consultorio de su  doctor(a), en una clnica privada, en un centro de atencin urgente o en una sala de emergencias.  Si tiene Engineering geologist, por favor llame inmediatamente al 911 o vaya a la sala de emergencias.  Nmeros de bper  - Dr. Nehemiah Massed: [phone]  - Dra. Moye: [phone]  - Dra. Nicole Kindred: [phone]  En caso de inclemencias del [place], por favor llame a Johnsie Kindred principal al [phone] para una actualizacin sobre el [place] de cualquier retraso o cierre.  Consejos para la medicacin en dermatologa: Por favor, guarde las cajas en las que vienen los medicamentos de uso tpico para ayudarle a seguir las instrucciones sobre dnde y cmo usarlos. Las farmacias generalmente imprimen las instrucciones del medicamento slo en las cajas y no directamente en los tubos del [place].   Si su medicamento es muy caro, por favor, pngase en contacto con Zigmund Daniel llamando al [phone] y presione la opcin 4 o envenos un mensaje a travs de Pharmacist, community.   No podemos decirle cul ser su copago por los medicamentos por adelantado ya que esto es diferente dependiendo de la cobertura de su seguro. Sin embargo, es posible que podamos encontrar un medicamento sustituto a Electrical engineer un formulario para que el seguro cubra el medicamento que se considera necesario.   Si se requiere una autorizacin previa para que su compaa de seguros [place] su medicamento, por favor permtanos de 1 a 2 das hbiles para completar este proceso.  Los precios de los medicamentos varan con frecuencia dependiendo del Environmental consultant de dnde se surte la receta y alguna farmacias pueden ofrecer precios ms baratos.  El sitio web www.goodrx.com tiene cupones para medicamentos de Airline pilot. Los precios aqu no tienen en cuenta lo que podra costar con la ayuda del seguro (puede ser ms barato con su seguro), pero el sitio web puede darle el precio si no utiliz Research scientist (physical sciences).  - Puede imprimir el cupn  correspondiente y llevarlo con su receta a la farmacia.  - Tambin puede pasar por nuestra oficina durante el horario de atencin regular y Charity fundraiser una tarjeta de cupones de GoodRx.  - Si necesita que su receta se enve electrnicamente a una farmacia diferente, informe a nuestra oficina a travs de MyChart de [place] o por telfono llamando al [phone] y presione la opcin 4.   Eczema Skin Care  Buy TWO 16oz jars of CeraVe moisturizing cream  [facility], [facility], [facility] (no prescription needed)  Costs about $15 per jar   Jar #1: Use as a moisturizer as needed. Can be applied to any area of the body. Use twice daily to unaffected areas.  Jar #2: Pour one 69ml bottle of clobetasol 0.05% solution into jar, mix well. Label this jar to indicate the medication has been added. Use twice daily to affected areas rash on back, sides, buttocks. Do not apply to face, groin or underarms.  Moisturizer may burn or sting initially. Try for at least 4 weeks.  Avoid hot showers Start Dove for sensitive skin body wash Continue Allegra 1 pill in the morning May start Zyrtec 1 pill in the evening if needed for itching  Recommend OTC [place] Rapid Relief Anti-Itch cream (pramoxine + menthol), CeraVe Anti-itch cream or lotion (pramoxine), Sarna lotion (Original- menthol + camphor or Sensitive- pramoxine) or Eucerin 12 hour Itch Relief lotion (menthol) up to 3 times per day to areas on body that are itchy.    Dry Skin Care  What causes dry skin?  Dry skin is common and results from inadequate moisture in the outer skin layers. Dry skin usually results from the excessive loss of moisture from the skin surface. This occurs due to two major factors: Normally the skin's oil glands deposit a layer of oil on the skin's surface. This layer of oil prevents the loss of moisture from the skin. Exposure to soaps, cleaners, solvents, and disinfectants removes this oily film, allowing water to escape. Water loss  from the skin increases when the humidity is low. During winter months we spend a lot of time indoors where the air is heated. Heated air has very low humidity. This also contributes to dry skin.  A tendency for dry skin may accompany such disorders as eczema. Also, as people age, the number of functioning oil glands decreases, and the tendency toward dry skin can be a sensation of skin tightness when emerging from the shower.  How do I manage dry skin?  Humidify your environment. This can be accomplished by using a humidifier in your bedroom at night during winter months. Bathing can actually put moisture back into your skin if done right. Take the following steps while bathing to sooth dry skin: Avoid hot water, which only dries the skin and makes itching worse. Use warm water. Avoid washcloths or extensive rubbing or scrubbing. Use mild soaps like unscented Dove, Oil of Olay, Cetaphil, Basis, or CeraVe. If you take baths rather than showers, rinse off soap residue with clean water before getting out of tub. Once out of the shower/tub, pat dry gently with a soft towel. Leave your skin damp. While still damp, apply any medicated ointment/cream you were prescribed to the affected areas. After you apply your medicated ointment/cream, then apply your moisturizer to your whole body.This is the most important step in dry skin care. If this is omitted, your skin will continue to be dry. The choice of moisturizer is also very important. In general, lotion will not provider enough moisture to severely dry skin because it is water based. You should use an ointment or cream. Moisturizers should also be unscented. Good choices include Vaseline (plain petrolatum), Aquaphor, Cetaphil, CeraVe, Vanicream, DML Forte, Aveeno moisture, or Eucerin Cream. Bath oils can be helpful, but do not replace the application of moisturizer after the bath. In addition, they make the tub slippery causing an increased risk for  falls. Therefore, we do not recommend their use.

## 2021-05-27 ENCOUNTER — Other Ambulatory Visit: Payer: Self-pay

## 2021-05-27 ENCOUNTER — Ambulatory Visit (INDEPENDENT_AMBULATORY_CARE_PROVIDER_SITE_OTHER): Payer: Medicare Other | Admitting: Dermatology

## 2021-05-27 DIAGNOSIS — L309 Dermatitis, unspecified: Secondary | ICD-10-CM

## 2021-05-27 NOTE — Progress Notes (Signed)
° °  Follow-Up Visit   Subjective  Eileen Walsh is a 87 y.o. female who presents for the following: Follow-up (3 week follow up on dermatitis. Patient reports she is very relieved. Patient reports still has a few places of rash but continue to  use cerave mix cream. Patient reports no new areas. ).    The following portions of the chart were reviewed this encounter and updated as appropriate:      Review of Systems: No other skin or systemic complaints.  Objective  Well appearing patient in no apparent distress; mood and affect are within normal limits.  A focused examination was performed including back, arms, legs, chest, neck. Relevant physical exam findings are noted in the Assessment and Plan.  Neck - Posterior Light violaceous macules with xerosis at posterior neck and light violaceous macules with light pink scaly patches and xerosis at back   Assessment & Plan  Dermatitis Neck - Posterior  Much improved.  Recommend mild soap (Dove) and moisturizing cream 1-2 times daily.  Gentle skin care handout provided.   Continue with Clobetasol/CeraVe mix bid as needed for itchy areas  If no longer itchy recommend plain Cerave cream to prevent recurrence   Related Medications clobetasol (TEMOVATE) 0.05 % external solution Apply 1 application topically 2 (two) times daily. Mix 1 bottle into 1 tub of cerave cream and use qd/bid aa rash on back, sides, buttocks until clear  Return if symptoms worsen or fail to improve.  Documentation: I have reviewed the above documentation for accuracy and completeness, and I agree with the above.  Willeen Niece MD

## 2021-05-27 NOTE — Patient Instructions (Addendum)
Continue as needed Eczema Skin Care  Buy TWO 16oz jars of CeraVe moisturizing cream  [facility], [facility], [facility] (no prescription needed)  Costs about $15 per jar   Jar #1: Use as a moisturizer as needed. Can be applied to any area of the body. Use twice daily to unaffected areas.  Jar #2: Pour one 41ml bottle of clobetasol 0.05% solution into jar, mix well. Label this jar to indicate the medication has been added. Use twice daily to affected areas. Do not apply to face, groin or underarms.  Moisturizer may burn or sting initially. Try for at least 4 weeks.    Topical steroids (such as triamcinolone, fluocinolone, fluocinonide, mometasone, clobetasol, halobetasol, betamethasone, hydrocortisone) can cause thinning and lightening of the skin if they are used for too long in the same area. Your physician has selected the right strength medicine for your problem and area affected on the body. Please use your medication only as directed by your physician to prevent side effects.   Avoid applying to face, groin, and axilla. Use as directed. Long-term use can cause thinning of the skin.  Continue with Dove soap  Gentle Skin Care Guide  1. Bathe no more than once a day.  2. Avoid bathing in hot water  3. Use a mild soap like Dove, Vanicream, Cetaphil, CeraVe. Can use Lever 2000 or Cetaphil antibacterial soap  4. Use soap only where you need it. On most days, use it under your arms, between your legs, and on your feet. Let the water rinse other areas unless visibly dirty.  5. When you get out of the bath/shower, use a towel to gently blot your skin dry, don't rub it.  6. While your skin is still a little damp, apply a moisturizing cream such as Vanicream, CeraVe, Cetaphil, Eucerin, Sarna lotion or plain Vaseline Jelly. For hands apply Neutrogena [place] Hand Cream or Excipial Hand Cream.  7. Reapply moisturizer any time you start to itch or feel dry.  8. Sometimes using free and clear laundry  detergents can be helpful. Fabric softener sheets should be avoided. Downy Free & Gentle liquid, or any liquid fabric softener that is free of dyes and perfumes, it acceptable to use  9. If your doctor has given you prescription creams you may apply moisturizers over them      If You Need Anything After Your Visit  If you have any questions or concerns for your doctor, please call our main line at [phone] and press option 4 to reach your doctor's medical assistant. If no one answers, please leave a voicemail as directed and we will return your call as soon as possible. Messages left after 4 pm will be answered the following business day.   You may also send [place] a message via MyChart. We typically respond to MyChart messages within 1-2 business days.  For prescription refills, please ask your pharmacy to contact our office. Our fax number is [phone].  If you have an urgent issue when the clinic is closed that cannot wait until the next business day, you can page your doctor at the number below.    Please note that while we do our best to be available for urgent issues outside of office hours, we are not available 24/7.   If you have an urgent issue and are unable to reach [place], you may choose to seek medical care at your doctor's office, retail clinic, urgent care center, or emergency room.  If you have a medical emergency,  please immediately call 911 or go to the emergency department.  Pager Numbers  - Dr. Nehemiah Massed: [phone]  - Dr. Laurence Ferrari: [phone]  - Dr. Nicole Kindred: [phone]  In the event of inclement weather, please call our main line at [phone] for an update on the status of any delays or closures.  Dermatology Medication Tips: Please keep the boxes that topical medications come in in order to help keep track of the instructions about where and how to use these. Pharmacies typically print the medication instructions only on the boxes and not directly on the  medication tubes.   If your medication is too expensive, please contact our office at [phone] option 4 or send [place] a message through [place].   We are unable to tell what your co-pay for medications will be in advance as this is different depending on your insurance coverage. However, we may be able to find a substitute medication at lower cost or fill out paperwork to get insurance to cover a needed medication.   If a prior authorization is required to get your medication covered by your insurance company, please allow [place] 1-2 business days to complete this process.  Drug prices often vary depending on where the prescription is filled and some pharmacies may offer cheaper prices.  The website www.goodrx.com contains coupons for medications through different pharmacies. The prices here do not account for what the cost may be with help from insurance (it may be cheaper with your insurance), but the website can give you the price if you did not use any insurance.  - You can print the associated coupon and take it with your prescription to the pharmacy.  - You may also stop by our office during regular business hours and pick up a GoodRx coupon card.  - If you need your prescription sent electronically to a different pharmacy, notify our office through [facility] or by phone at [phone] option 4.     Si Usted Necesita Algo Despus de Su Visita  Tambin puede enviarnos un mensaje a travs de Pharmacist, community. Por lo general respondemos a los mensajes de MyChart en el transcurso de 1 a 2 das hbiles.  Para renovar recetas, por favor pida a su farmacia que se ponga en contacto con nuestra oficina. Harland Dingwall de fax es [place] [phone].  Si tiene un asunto urgente cuando la clnica est cerrada y que no puede esperar hasta el siguiente da hbil, puede llamar/localizar a su doctor(a) al nmero que aparece a continuacin.   Por favor, tenga en cuenta que aunque hacemos todo lo posible  para estar disponibles para asuntos urgentes fuera del horario de [place], no estamos disponibles las 24 horas del da, los 7 das de la [place].   Si tiene un problema urgente y no puede comunicarse con nosotros, puede optar por buscar atencin mdica  en el consultorio de su doctor(a), en una clnica privada, en un centro de atencin urgente o en una sala de emergencias.  Si tiene Engineering geologist, por favor llame inmediatamente al 911 o vaya a la sala de emergencias.  Nmeros de bper  - Dr. Nehemiah Massed: [phone]  - Dra. Moye: [phone]  - Dra. Nicole Kindred: [phone]  En caso de inclemencias del [place], por favor llame a Johnsie Kindred principal al [phone] para una actualizacin sobre el [place] de cualquier retraso o cierre.  Consejos para la medicacin en dermatologa: Por favor, guarde las cajas en las que vienen los medicamentos de uso tpico para ayudarle a seguir  las instrucciones sobre dnde y cmo usarlos. Las farmacias generalmente imprimen las instrucciones del medicamento slo en las cajas y no directamente en los tubos del [place].   Si su medicamento es muy caro, por favor, pngase en contacto con Zigmund Daniel llamando al [phone] y presione la opcin 4 o envenos un mensaje a travs de Pharmacist, community.   No podemos decirle cul ser su copago por los medicamentos por adelantado ya que esto es diferente dependiendo de la cobertura de su seguro. Sin embargo, es posible que podamos encontrar un medicamento sustituto a Electrical engineer un formulario para que el seguro cubra el medicamento que se considera necesario.   Si se requiere una autorizacin previa para que su compaa de seguros [place] su medicamento, por favor permtanos de 1 a 2 das hbiles para completar este proceso.  Los precios de los medicamentos varan con frecuencia dependiendo del Environmental consultant de dnde se surte la receta y alguna farmacias pueden ofrecer precios ms baratos.  El sitio web  www.goodrx.com tiene cupones para medicamentos de Airline pilot. Los precios aqu no tienen en cuenta lo que podra costar con la ayuda del seguro (puede ser ms barato con su seguro), pero el sitio web puede darle el precio si no utiliz Research scientist (physical sciences).  - Puede imprimir el cupn correspondiente y llevarlo con su receta a la farmacia.  - Tambin puede pasar por nuestra oficina durante el horario de atencin regular y Charity fundraiser una tarjeta de cupones de GoodRx.  - Si necesita que su receta se enve electrnicamente a una farmacia diferente, informe a nuestra oficina a travs de MyChart de [place] o por telfono llamando al [phone] y presione la opcin 4.

## 2021-07-02 ENCOUNTER — Emergency Department: Payer: Medicare Other

## 2021-07-02 ENCOUNTER — Observation Stay
Admission: EM | Admit: 2021-07-02 | Discharge: 2021-07-04 | Disposition: A | Discharge status: Home / Self Care | Payer: Medicare Other | Attending: Hospitalist | Admitting: Hospitalist

## 2021-07-02 ENCOUNTER — Observation Stay: Payer: Medicare Other

## 2021-07-02 DIAGNOSIS — I480 Paroxysmal atrial fibrillation: Secondary | ICD-10-CM | POA: Diagnosis not present

## 2021-07-02 DIAGNOSIS — Z79899 Other long term (current) drug therapy: Secondary | ICD-10-CM | POA: Diagnosis not present

## 2021-07-02 DIAGNOSIS — R531 Weakness: Secondary | ICD-10-CM | POA: Diagnosis not present

## 2021-07-02 DIAGNOSIS — H8111 Benign paroxysmal vertigo, right ear: Secondary | ICD-10-CM | POA: Diagnosis not present

## 2021-07-02 DIAGNOSIS — I2510 Atherosclerotic heart disease of native coronary artery without angina pectoris: Secondary | ICD-10-CM | POA: Diagnosis not present

## 2021-07-02 DIAGNOSIS — R42 Dizziness and giddiness: Secondary | ICD-10-CM

## 2021-07-02 DIAGNOSIS — Z20822 Contact with and (suspected) exposure to covid-19: Secondary | ICD-10-CM | POA: Diagnosis not present

## 2021-07-02 DIAGNOSIS — I10 Essential (primary) hypertension: Secondary | ICD-10-CM | POA: Diagnosis not present

## 2021-07-02 LAB — CBC WITH DIFFERENTIAL/PLATELET
Abs Immature Granulocytes: 0.03 10*3/uL (ref 0.00–0.07)
Basophils Absolute: 0.1 10*3/uL (ref 0.0–0.1)
Basophils Relative: 1 %
Eosinophils Absolute: 0.4 10*3/uL (ref 0.0–0.5)
Eosinophils Relative: 4 %
HCT: 44.5 % (ref 36.0–46.0)
Hemoglobin: 15.2 g/dL — ABNORMAL HIGH (ref 12.0–15.0)
Immature Granulocytes: 0 %
Lymphocytes Relative: 25 %
Lymphs Abs: 2.2 10*3/uL (ref 0.7–4.0)
MCH: 31.1 pg (ref 26.0–34.0)
MCHC: 34.2 g/dL (ref 30.0–36.0)
MCV: 91 fL (ref 80.0–100.0)
Monocytes Absolute: 0.8 10*3/uL (ref 0.1–1.0)
Monocytes Relative: 9 %
Neutro Abs: 5.3 10*3/uL (ref 1.7–7.7)
Neutrophils Relative %: 61 %
Platelets: 247 10*3/uL (ref 150–400)
RBC: 4.89 MIL/uL (ref 3.87–5.11)
RDW: 12.5 % (ref 11.5–15.5)
WBC: 8.8 10*3/uL (ref 4.0–10.5)
nRBC: 0 % (ref 0.0–0.2)

## 2021-07-02 LAB — TROPONIN I (HIGH SENSITIVITY)
Troponin I (High Sensitivity): 2 ng/L (ref <18)
Troponin I (High Sensitivity): 5 ng/L (ref <18)

## 2021-07-02 LAB — URINALYSIS, ROUTINE W REFLEX MICROSCOPIC
Bilirubin Urine: NEGATIVE
Glucose, UA: 50 mg/dL — AB
Hgb urine dipstick: NEGATIVE
Ketones, ur: 5 mg/dL — AB
Nitrite: NEGATIVE
Protein, ur: NEGATIVE mg/dL
Specific Gravity, Urine: 1.012 (ref 1.005–1.030)
pH: 6 (ref 5.0–8.0)

## 2021-07-02 LAB — COMPREHENSIVE METABOLIC PANEL
ALT: 21 U/L (ref 0–44)
AST: 30 U/L (ref 15–41)
Albumin: 3.9 g/dL (ref 3.5–5.0)
Alkaline Phosphatase: 73 U/L (ref 38–126)
Anion gap: 7 (ref 5–15)
BUN: 16 mg/dL (ref 8–23)
CO2: 23 mmol/L (ref 22–32)
Calcium: 9.1 mg/dL (ref 8.9–10.3)
Chloride: 103 mmol/L (ref 98–111)
Creatinine, Ser: 0.83 mg/dL (ref 0.44–1.00)
GFR, Estimated: >60 mL/min (ref >60)
Glucose, Bld: 139 mg/dL — ABNORMAL HIGH (ref 70–99)
Potassium: 3.7 mmol/L (ref 3.5–5.1)
Sodium: 133 mmol/L — ABNORMAL LOW (ref 135–145)
Total Bilirubin: 0.8 mg/dL (ref 0.3–1.2)
Total Protein: 7.2 g/dL (ref 6.5–8.1)

## 2021-07-02 LAB — RESP PANEL BY RT-PCR (FLU A&B, COVID) ARPGX2
Influenza A by PCR: NEGATIVE
Influenza B by PCR: NEGATIVE
SARS Coronavirus 2 by RT PCR: NEGATIVE

## 2021-07-02 MED ORDER — ONDANSETRON HCL 4 MG/2ML IJ SOLN: 4.0000 mg | Freq: Four times a day (QID) | INTRAMUSCULAR | Status: DC | PRN

## 2021-07-02 MED ORDER — ONDANSETRON HCL 4 MG PO TABS: 4.0000 mg | ORAL_TABLET | Freq: Four times a day (QID) | ORAL | Status: DC | PRN

## 2021-07-02 MED ORDER — ACETAMINOPHEN 325 MG PO TABS: 650.0000 mg | ORAL_TABLET | Freq: Four times a day (QID) | ORAL | Status: DC | PRN

## 2021-07-02 MED ORDER — MECLIZINE HCL 25 MG PO TABS
50.0000 mg | ORAL_TABLET | Freq: Once | ORAL | Status: AC
  Administered 2021-07-02: 50 mg via ORAL
  Filled 2021-07-02: qty 2

## 2021-07-02 MED ORDER — ENOXAPARIN SODIUM 40 MG/0.4ML IJ SOSY
0.5000 mg/kg | PREFILLED_SYRINGE | INTRAMUSCULAR | Status: DC
  Administered 2021-07-02 – 2021-07-03 (×2): 42.5 mg via SUBCUTANEOUS
  Filled 2021-07-02 (×2): qty 0.8

## 2021-07-02 MED ORDER — ONDANSETRON HCL 4 MG/2ML IJ SOLN
4.0000 mg | Freq: Once | INTRAMUSCULAR | Status: AC
  Administered 2021-07-02: 4 mg via INTRAVENOUS
  Filled 2021-07-02: qty 2

## 2021-07-02 MED ORDER — MECLIZINE HCL 25 MG PO TABS
25.0000 mg | ORAL_TABLET | Freq: Three times a day (TID) | ORAL | Status: DC | PRN
  Administered 2021-07-04: 25 mg via ORAL
  Filled 2021-07-02 (×2): qty 1

## 2021-07-02 MED ORDER — ACETAMINOPHEN 650 MG RE SUPP: 650.0000 mg | Freq: Four times a day (QID) | RECTAL | Status: DC | PRN

## 2021-07-02 MED ORDER — SODIUM CHLORIDE 0.9 % IV BOLUS
1000.0000 mL | Freq: Once | INTRAVENOUS | Status: AC
  Administered 2021-07-02: 1000 mL via INTRAVENOUS

## 2021-07-02 NOTE — Progress Notes (Signed)
Anticoagulation monitoring(Lovenox):  88yo  F ordered Lovenox 40 mg Q24h    Filed Weights   07/02/21 1335  Weight: 83.8 kg (184 lb 11.9 oz)   BMI 33.7   Lab Results  Component Value Date   CREATININE 0.83 07/02/2021   CREATININE 1.10 (H) 01/14/2021   CREATININE 1.27 (H) 10/30/2019   Estimated Creatinine Clearance: 45.2 mL/min (by C-G formula based on SCr of 0.83 mg/dL). Hemoglobin & Hematocrit     Component Value Date/Time   HGB 15.2 (H) 07/02/2021 1339   HGB 14.4 01/14/2021 0948   HCT 44.5 07/02/2021 1339   HCT 42.7 01/14/2021 0948     Per Protocol for Patient with estCrcl > 30 ml/min and BMI> 30, will transition to Lovenox 0.5 mg/kg Q24h       Chinita Greenland PharmD Clinical Pharmacist 07/02/2021

## 2021-07-02 NOTE — H&P (Signed)
History and Physical    Eileen Walsh [id number] DOB: 1929-05-29 DOA: 07/02/2021  PCP: Jerrol Banana., MD   Patient coming from: home  I have personally briefly reviewed patient's relevant medical records in [place]  Chief Complaint: dizziness  HPI: Eileen Walsh is a 88 y.o. female with medical history significant for CAD not on aspirin statin or ACE/ARB due to prior side effects,, paroxysmal A. fib, isolated episode in 2020 with CHA2DS2-VASc score of 4, who declined recommendation for anticoagulation due to concerns for falls and bleeding, HTN, HLD who presents to the ED with a complaint of dizziness.  Patient has a longstanding history of dizziness/vertigo going back a few years, mostly positional for which she is intermittently use meclizine however on this occasion his symptoms do not appear to be resolving with meclizine or Dramamine.  The symptoms also appear more severe than usual and she has nausea but without vomiting with any change of position.  She denies headache, one-sided weakness numbness or tingling, difficulties with speech or swallowing denies abdominal pain, chest pain, fever, chills.  ED course: BP high of 166/75 in the ED with otherwise normal vitals Blood work with CBC, CMP and troponin unremarkable Urinalysis with trace urinalysis COVID and flu negative  EKG: NSR at 93 with no acute ST-T wave symptoms  Imaging: CT head nonacute MRI head pending  Patient received meclizine and an IV fluid bolus in the ED but continued to have severe dizziness with minimal positional changes.  Due to patient's advanced age, severity of symptoms, hospitalist consulted for admission.   Review of Systems: As per HPI otherwise all other systems on review of systems negative.   Assessment/Plan  Dizziness, with history of vertigo - CT head negative - Follow-up MRI to evaluate for central etiology - Telemetry monitoring to evaluate for A. fib and correlating  symptoms - Dexamethasone, meclizine, with Valium for breakthrough - Consider ENT consult if contractible with above measures    Essential (primary) hypertension - Continue amlodipine    CAD (coronary artery disease) - No complaints of chest pain - Patient is not on any related meds due to prior side effects, per prior documentation and cardiology notes    AF (paroxysmal atrial fibrillation) ([place]) - Not on anticoagulation per patient preference - Cardiac monitoring   DVT prophylaxis: Lovenox  Code Status: full code  Family Communication:  none  Disposition Plan: Back to previous home environment Consults called: none  Status: Observation    Physical Exam: Vitals:   07/02/21 1815 07/02/21 1830 07/02/21 1845 07/02/21 1900  BP:  (!) 162/72  (!) 160/71  Pulse: 91 85 96 91  Resp: (!) 23 (!) 22 13 19   Temp:      TempSrc:      SpO2: 95% 92% 94% 95%  Weight:      Height:       Constitutional: Alert, oriented x 3 . Not in any apparent distress HEENT:      Head: Normocephalic and atraumatic.         Eyes: PERLA, EOMI, Conjunctivae are normal. Sclera is non-icteric.       Mouth/Throat: Mucous membranes are moist.       Neck: Supple with no signs of meningismus. Cardiovascular: Regular rate and rhythm. No murmurs, gallops, or rubs. 2+ symmetrical distal pulses are present . No JVD. No  LE edema Respiratory: Respiratory effort normal .Lungs sounds clear bilaterally. No wheezes, crackles, or rhonchi.  Gastrointestinal: Soft, non tender, non  distended. Positive bowel sounds.  Genitourinary: No CVA tenderness. Musculoskeletal: Nontender with normal range of motion in all extremities. No cyanosis, or erythema of extremities. Neurologic:  Face is symmetric. Moving all extremities. No gross focal neurologic deficits . Skin: Skin is warm, dry.  No rash or ulcers Psychiatric: Mood and affect are appropriate     Past Medical History:  Diagnosis Date   Arthritis    Fibromyalgia     Hypertension    Macular degeneration     Past Surgical History:  Procedure Laterality Date   [place]   Breast Bx   CHOLECYSTECTOMY       reports that she has never smoked. She has never used smokeless tobacco. She reports that she does not currently use alcohol. She reports that she does not use drugs.  Allergies  Allergen Reactions   Aspirin Other (See Comments)   Sulfa Antibiotics Hives and Other (See Comments)    Family History  Problem Relation Age of Onset   Hypertension Mother    Arthritis Mother    Hyperlipidemia Mother    Heart disease Father    Hypertension Brother    Stroke Brother    Diabetes Brother    Arthritis Brother    Heart disease Brother    Anxiety disorder Brother        severe. had nervous breakdown      Prior to Admission medications   Medication Sig Start Date End Date Taking? Authorizing Provider  amLODipine-olmesartan (AZOR) 5-40 MG tablet Take 1 tablet by mouth daily. 08/06/20  Yes Jerrol Banana., MD  clobetasol (TEMOVATE) 0.05 % external solution Apply 1 application topically 2 (two) times daily. Mix 1 bottle into 1 tub of cerave cream and use qd/bid aa rash on back, sides, buttocks until clear 05/06/21  Yes Brendolyn Patty, MD  Multiple Vitamin (MULTIVITAMIN WITH MINERALS) TABS tablet Take 1 tablet by mouth daily.   Yes [provider]  naproxen sodium (ALEVE) 220 MG tablet Take 220 mg by mouth.   Yes [provider]  omeprazole (PRILOSEC) 20 MG capsule TAKE 1 CAPSULE IN THE MORNING 08/06/20  Yes Jerrol Banana., MD  predniSONE (DELTASONE) 5 MG tablet 12 day taper Patient not taking: Reported on 05/06/2021 02/03/21   Jerrol Banana., MD      Labs on Admission: I have personally reviewed following labs and imaging studies  CBC: Recent Labs  Lab 07/02/21 1339  WBC 8.8  NEUTROABS 5.3  HGB 15.2*  HCT 44.5  MCV 91.0  PLT [id number]   Basic Metabolic  Panel: Recent Labs  Lab 07/02/21 1339  NA 133*  K 3.7  CL 103  CO2 23  GLUCOSE 139*  BUN 16  CREATININE 0.83  CALCIUM 9.1   GFR: Estimated Creatinine Clearance: 45.2 mL/min (by C-G formula based on SCr of 0.83 mg/dL). Liver Function Tests: Recent Labs  Lab 07/02/21 1339  AST 30  ALT 21  ALKPHOS 73  BILITOT 0.8  PROT 7.2  ALBUMIN 3.9   No results for input(s): LIPASE, AMYLASE in the last 168 hours. No results for input(s): AMMONIA in the last 168 hours. Coagulation Profile: No results for input(s): INR, PROTIME in the last 168 hours. Cardiac Enzymes: No results for input(s): CKTOTAL, CKMB, CKMBINDEX, TROPONINI in the last 168 hours. BNP (last 3 results) No results for input(s): PROBNP in the last 8760 hours. HbA1C: No results for input(s): HGBA1C in the last  72 hours. CBG: No results for input(s): GLUCAP in the last 168 hours. Lipid Profile: No results for input(s): CHOL, HDL, LDLCALC, TRIG, CHOLHDL, LDLDIRECT in the last 72 hours. Thyroid Function Tests: No results for input(s): TSH, T4TOTAL, FREET4, T3FREE, THYROIDAB in the last 72 hours. Anemia Panel: No results for input(s): VITAMINB12, FOLATE, FERRITIN, TIBC, IRON, RETICCTPCT in the last 72 hours. Urine analysis:    Component Value Date/Time   COLORURINE YELLOW (A) 07/02/2021 1454   APPEARANCEUR CLEAR (A) 07/02/2021 1454   LABSPEC 1.012 07/02/2021 1454   PHURINE 6.0 07/02/2021 1454   GLUCOSEU 50 (A) 07/02/2021 1454   HGBUR NEGATIVE 07/02/2021 1454   BILIRUBINUR NEGATIVE 07/02/2021 1454   KETONESUR 5 (A) 07/02/2021 1454   PROTEINUR NEGATIVE 07/02/2021 1454   NITRITE NEGATIVE 07/02/2021 1454   LEUKOCYTESUR TRACE (A) 07/02/2021 1454    Radiological Exams on Admission: CT HEAD WO CONTRAST (5MM)  Result Date: 07/02/2021 CLINICAL DATA:  Dizziness EXAM: CT HEAD WITHOUT CONTRAST TECHNIQUE: Contiguous axial images were obtained from the base of the skull through the vertex without intravenous contrast.  RADIATION DOSE REDUCTION: This exam was performed according to the departmental dose-optimization program which includes automated exposure control, adjustment of the mA and/or kV according to patient size and/or use of iterative reconstruction technique. COMPARISON:  None. FINDINGS: Brain: Chronic white matter ischemic change. No evidence of acute infarction, hemorrhage, hydrocephalus, extra-axial collection or mass lesion/mass effect. Vascular: No hyperdense vessel or unexpected calcification. Skull: Normal. Negative for fracture or focal lesion. Sinuses/Orbits: No acute finding. Other: None. IMPRESSION: No acute intracranial abnormality. Electronically Signed   By: Yetta Glassman M.D.   On: 07/02/2021 16:34       Athena Masse MD [facility]   07/02/2021, 8:40 PM

## 2021-07-02 NOTE — ED Notes (Signed)
Resting in bed. Dr. Erma Heritage at bedside. Pt stating that while she is laying down she feels fine. Pt states when she sits up or stands up describes the room going around. A&Ox4. Skin p/w/d. RR even and nonlabored. RA.

## 2021-07-02 NOTE — ED Provider Notes (Signed)
Patient with persistent, severe vertigo and unable to ambulate despite medications.  CT head reviewed shows no acute intracranial abnormality.  She has no electrolyte abnormalities or evidence to suggest alternative reason for her vertigo.  Given persistence with inability to ambulate, will plan to admit to medicine.   Shaune Pollack, MD 07/02/21 2237

## 2021-07-02 NOTE — ED Provider Notes (Signed)
[facility] Provider Note    Event Date/Time   First MD Initiated Contact with Patient 07/02/21 1345     (approximate)   History   Weakness (Dizziness, weakness, history of dizziness , nausea, vomiting , )   HPI Eileen Walsh is a 88 y.o. female with a stated past medical history of vertigo who presents for weakness, vertigo, nausea, and vomiting that began last night.  Patient states that she took some Dramamine with only minimal improvement in her symptoms and feels that she may have vomited this medication back up after taking it.  Patient states that she went to sleep last night and upon getting up this morning, symptoms were persistent and she presented to the emergency department.  Patient denies any headache, chest pain, shortness of breath, abdominal pain, weakness/numbness/paresthesias in any extremity     Physical Exam   Triage Vital Signs: ED Triage Vitals  Enc Vitals Group     BP 07/02/21 1336 130/75     Pulse Rate 07/02/21 1336 91     Resp 07/02/21 1336 17     Temp 07/02/21 1334 98.4 F (36.9 C)     Temp Source 07/02/21 1334 Oral     SpO2 --      Weight 07/02/21 1335 184 lb 11.9 oz (83.8 kg)     Height 07/02/21 1335 5\' 2"  (1.575 m)     Head Circumference --      Peak Flow --      Pain Score 07/02/21 1337 6     Pain Loc --      Pain Edu? --      Excl. in GC? --     Most recent vital signs: Vitals:   07/02/21 1334 07/02/21 1336  BP:  130/75  Pulse:  91  Resp:  17  Temp: 98.4 F (36.9 C) 98.3 F (36.8 C)    General: Awake, no distress.  CV:  Good peripheral perfusion.  Resp:  Normal effort.  Abd:  No distention.  Other:  hints exam consistent with peripheral vertigo   ED Results / Procedures / Treatments   Labs (all labs ordered are listed, but only abnormal results are displayed) Labs Reviewed  COMPREHENSIVE METABOLIC PANEL  CBC WITH DIFFERENTIAL/PLATELET  URINALYSIS, ROUTINE W REFLEX MICROSCOPIC     EKG ED  ECG REPORT I, 07/04/21, the attending physician, personally viewed and interpreted this ECG.  Date: 07/02/2021 EKG Time: 1336 Rate: 93 Rhythm: normal sinus rhythm QRS Axis: normal Intervals: normal ST/T Wave abnormalities: normal Narrative Interpretation: no evidence of acute ischemia  PROCEDURES:  Critical Care performed: No  .1-3 Lead EKG Interpretation Performed by: 07/04/2021, MD Authorized by: Merwyn Katos, MD     Interpretation: normal     ECG rate:  89   ECG rate assessment: normal     Rhythm: sinus rhythm     Ectopy: none     Conduction: normal     MEDICATIONS ORDERED IN ED: Medications  ondansetron (ZOFRAN) injection 4 mg (4 mg Intravenous Given 07/02/21 1401)  sodium chloride 0.9 % bolus 1,000 mL (1,000 mLs Intravenous New Bag/Given 07/02/21 1402)  meclizine (ANTIVERT) tablet 50 mg (50 mg Oral Given 07/02/21 1402)     IMPRESSION / MDM / ASSESSMENT AND PLAN / ED COURSE  I reviewed the triage vital signs and the nursing notes.  Differential diagnosis includes, but is not limited to, CVA, TIA, labyrinthitis, BPPV  The patient is on the cardiac monitor to evaluate for evidence of arrhythmia and/or significant heart rate changes.  Based on History, Exam, and Findings, presentation not consistent with syncope, seizure, stroke, meningitis, symptomatic anemia (gastrointestinal bleed), Increased ICP (cerebral tumor/mass), ICH. Additionally, I have a low suspicion for AOM, labyrinthitis, or other infectious process. Tx: meclizine  Disposition: Care of this patient will be signed out to the oncoming physician at the end of my shift.  All pertinent patient information conveyed and all questions answered.  All further care and disposition decisions will be made by the oncoming physician.       FINAL CLINICAL IMPRESSION(S) / ED DIAGNOSES   Final diagnoses:  Weakness  Vertigo     Rx / DC Orders   ED Discharge Orders      None        Note:  This document was prepared using Dragon voice recognition software and may include unintentional dictation errors.   Merwyn Katos, MD 07/02/21 [phone]

## 2021-07-02 NOTE — ED Triage Notes (Signed)
Dizziness, weakness, history of dizziness

## 2021-07-03 ENCOUNTER — Encounter: Payer: Self-pay | Admitting: Internal Medicine

## 2021-07-03 ENCOUNTER — Other Ambulatory Visit: Payer: Self-pay

## 2021-07-03 DIAGNOSIS — R531 Weakness: Secondary | ICD-10-CM

## 2021-07-03 MED ORDER — PANTOPRAZOLE SODIUM 40 MG PO TBEC
40.0000 mg | DELAYED_RELEASE_TABLET | Freq: Every day | ORAL | Status: DC
  Administered 2021-07-04: 40 mg via ORAL
  Filled 2021-07-03: qty 1

## 2021-07-03 MED ORDER — AMLODIPINE-OLMESARTAN 5-40 MG PO TABS: 1.0000 | ORAL_TABLET | Freq: Every day | ORAL | Status: DC

## 2021-07-03 MED ORDER — AMLODIPINE BESYLATE 5 MG PO TABS
5.0000 mg | ORAL_TABLET | Freq: Every day | ORAL | Status: DC
  Administered 2021-07-03 – 2021-07-04 (×2): 5 mg via ORAL
  Filled 2021-07-03 (×2): qty 1

## 2021-07-03 MED ORDER — IRBESARTAN 150 MG PO TABS
300.0000 mg | ORAL_TABLET | Freq: Every day | ORAL | Status: DC
  Administered 2021-07-03 – 2021-07-04 (×2): 300 mg via ORAL
  Filled 2021-07-03 (×2): qty 2

## 2021-07-03 MED ORDER — HYDRALAZINE HCL 20 MG/ML IJ SOLN: 10.0000 mg | Freq: Four times a day (QID) | INTRAMUSCULAR | Status: DC | PRN

## 2021-07-03 MED ORDER — DEXAMETHASONE SODIUM PHOSPHATE 4 MG/ML IJ SOLN
4.0000 mg | Freq: Three times a day (TID) | INTRAMUSCULAR | Status: DC
  Administered 2021-07-03: 4 mg via INTRAVENOUS
  Filled 2021-07-03 (×3): qty 1

## 2021-07-03 NOTE — Progress Notes (Addendum)
Met with the patient and her son in the room to discuss DC plan and needs, She lives alone but has family assistance, she drives but if needed her Daughter provides transportation She has a toilet riser with hand grips, she has a rail on her bed to grip to help her get up, she has 4 canes at home, She does not need additional DME she said.  Pt will eval and provide recommendations, the patient stated understanding when MOON was verbally reviewed

## 2021-07-03 NOTE — Care Management Obs Status (Signed)
MEDICARE OBSERVATION STATUS NOTIFICATION   Patient Details  Name: Eileen Walsh MRN: [id number] Date of Birth: 1928-09-19   Medicare Observation Status Notification Given:  Yes    Marlowe Sax, RN 07/03/2021, 10:51 AM

## 2021-07-03 NOTE — Evaluation (Signed)
Physical Therapy Evaluation Patient Details Name: Eileen Walsh MRN: [id number] DOB: [birth year] Today's Date: 07/03/2021  History of Present Illness  Eileen Walsh is a 88 y.o. female with medical history significant for CAD not on aspirin statin or ACE/ARB due to prior side effects,, paroxysmal A. fib, isolated episode in 2020 with CHA2DS2-VASc score of 4, who declined recommendation for anticoagulation due to concerns for falls and bleeding, HTN, HLD who presents to the ED with a complaint of dizziness.  Patient has a longstanding history of dizziness/vertigo going back a few years, mostly positional for which she is intermittently use meclizine however on this occasion his symptoms do not appear to be resolving with meclizine or Dramamine.  The symptoms also appear more severe than usual and she has nausea but without vomiting with any change of position.  She denies headache, one-sided weakness numbness or tingling, difficulties with speech or swallowing denies abdominal pain, chest pain, fever, chills.   Clinical Impression  Patient is agreeable to PT assessment, wants to go home. Daughter present in room. She requires cues for mobility due to impulsive nature. She reports she was unable to sit up on arrival due to dizziness. Has not attempted mobility since. Patient is able to get from supine to sit with supervision and bed rails. Performed sit to stand with min guard and ambulated with RW 25 feet with min assist. Patient is unsteady with mobility and would be at increased fall risk at this time. Patient will continue to benefit from skilled PT while here to improve safety with mobility. It is anticipated that patient will continue to improve and will be able to return home at discharge. Will re-assess tomorrow.           Recommendations for follow up therapy are one component of a multi-disciplinary discharge planning process, led by the attending physician.  Recommendations may be updated  based on patient status, additional functional criteria and insurance authorization.  Follow Up Recommendations Home health PT    Assistance Recommended at Discharge Frequent or constant Supervision/Assistance  Patient can return home with the following  A little help with walking and/or transfers;A little help with bathing/dressing/bathroom;Help with stairs or ramp for entrance;Assistance with cooking/housework;Assist for transportation    Equipment Recommendations None recommended by PT;Other (comment) (patient has rollator at home)  Recommendations for Other Services       Functional Status Assessment Patient has had a recent decline in their functional status and demonstrates the ability to make significant improvements in function in a reasonable and predictable amount of time.     Precautions / Restrictions Precautions Precautions: Fall Restrictions Weight Bearing Restrictions: No      Mobility  Bed Mobility Overal bed mobility: Independent             General bed mobility comments: slight dizziness with supine to sit    Transfers Overall transfer level: Needs assistance Equipment used: Rolling walker (2 wheels) Transfers: Sit to/from Stand Sit to Stand: Min guard                Ambulation/Gait Ambulation/Gait assistance: Min guard Gait Distance (Feet): 25 Feet Assistive device: Rolling walker (2 wheels) Gait Pattern/deviations: Step-through pattern Gait velocity: WNL     General Gait Details: slight instability with using walker walking in room. Requires cues for safety with mobility as she moves quickly and impulsively  Stairs            Wheelchair Mobility    Modified Rankin (Stroke  Patients Only)       Balance Overall balance assessment: Needs assistance Sitting-balance support: Feet supported Sitting balance-Leahy Scale: Good     Standing balance support: Bilateral upper extremity supported, During functional activity, Reliant  on assistive device for balance Standing balance-Leahy Scale: Fair Standing balance comment: benefits from B UE support for steadying with gait                             Pertinent Vitals/Pain Pain Assessment Pain Assessment: No/denies pain    Home Living Family/patient expects to be discharged to:: Private residence Living Arrangements: Alone Available Help at Discharge: Family;Available PRN/intermittently Type of Home: House Home Access: Stairs to enter Entrance Stairs-Rails: Psychiatric nurse of Steps: 5   Home Layout: One level Home Equipment: Rollator (4 wheels);Cane - single point      Prior Function Prior Level of Function : Independent/Modified Independent;Driving             Mobility Comments: uses cane at baseline ADLs Comments: independent at baseline     Hand Dominance        Extremity/Trunk Assessment   Upper Extremity Assessment Upper Extremity Assessment: Defer to OT evaluation    Lower Extremity Assessment Lower Extremity Assessment: Overall WFL for tasks assessed    Cervical / Trunk Assessment Cervical / Trunk Assessment: Normal  Communication   Communication: No difficulties  Cognition Arousal/Alertness: Awake/alert Behavior During Therapy: Impulsive Overall Cognitive Status: Within Functional Limits for tasks assessed                                          General Comments      Exercises     Assessment/Plan    PT Assessment Patient needs continued PT services  PT Problem List Decreased activity tolerance;Decreased balance;Decreased safety awareness;Decreased knowledge of precautions       PT Treatment Interventions DME instruction;Gait training;Stair training;Functional mobility training;Patient/family education;Therapeutic activities    PT Goals (Current goals can be found in the Care Plan section)  Acute Rehab PT Goals Patient Stated Goal: to go home PT Goal Formulation:  With patient/family Time For Goal Achievement: 07/10/21 Potential to Achieve Goals: Good    Frequency Min 2X/week     Co-evaluation               AM-PAC PT "6 Clicks" Mobility  Outcome Measure Help needed turning from your back to your side while in a flat bed without using bedrails?: None Help needed moving from lying on your back to sitting on the side of a flat bed without using bedrails?: None Help needed moving to and from a bed to a chair (including a wheelchair)?: A Little Help needed standing up from a chair using your arms (e.g., wheelchair or bedside chair)?: A Little Help needed to walk in hospital room?: A Little Help needed climbing 3-5 steps with a railing? : A Lot 6 Click Score: 19    End of Session Equipment Utilized During Treatment: Gait belt Activity Tolerance: Other (comment) (limited by dizziness) Patient left: in bed;with call bell/phone within reach;with bed alarm set;with family/visitor present Nurse Communication: Mobility status PT Visit Diagnosis: Unsteadiness on feet (R26.81)    Time: [id number] PT Time Calculation (min) (ACUTE ONLY): 34 min   Charges:   PT Evaluation $PT Eval Moderate Complexity: 1 Mod PT Treatments $Gait Training:  8-22 mins        [employer], PT, [employer] 07/03/21,3:21 PM

## 2021-07-03 NOTE — Progress Notes (Signed)
PROGRESS NOTE    Eileen Walsh  [id number] DOB: March 22, 1929 DOA: 07/02/2021 PCP: Jerrol Banana., MD  [id number]   Assessment & Plan:   Principal Problem:   Generalized weakness Active Problems:   Essential (primary) hypertension   Vertigo   CAD (coronary artery disease)   AF (paroxysmal atrial fibrillation) (HCC)   Eileen Walsh is a 88 y.o. female with medical history significant for CAD not on aspirin statin or ACE/ARB due to prior side effects,, paroxysmal A. fib, isolated episode in 2020 with CHA2DS2-VASc score of 4, who declined recommendation for anticoagulation due to concerns for falls and bleeding, HTN, HLD who presents to the ED with a complaint of dizziness.  Patient has a longstanding history of dizziness/vertigo going back a few years, mostly positional for which she is intermittently use meclizine however on this occasion his symptoms do not appear to be resolving with meclizine or Dramamine.  The symptoms also appear more severe than usual and she has nausea but without vomiting with any change of position.   Dizziness --pt reported about 4 episodes of dizziness in her lifetime, none lasted as long as the current episode. - CT head negative, MRI neg for acute finding. Plan: --PT/OT to dx BPPV --d/c dexamethasone     Essential (primary) hypertension --cont home amlodipine and olmesartan     CAD (coronary artery disease) - No complaints of chest pain - Patient is not on any related meds due to prior side effects, per prior documentation and cardiology notes     AF (paroxysmal atrial fibrillation) ([place]) - Not on anticoagulation per patient preference --no need for tele monitor  GERD --cont home PPI   DVT prophylaxis: Lovenox SQ Code Status: Full code  Family Communication: daughter updated at bedside today  Level of care: Med-Surg Dispo:   The patient is from: home Anticipated d/c is to: home Anticipated d/c date is: likely  tomorrow  Patient currently is not medically ready to d/c due to: unsteady and unsafe to return home today   Subjective and Interval History:  Pt said she had no dizziness when lying in bed.  Reduced appetite.  Worked with PT/OT, who found her unsteady and unsafe to return home today, but expect improvement tomorrow.   Objective: Vitals:   07/02/21 2223 07/03/21 0344 07/03/21 1130 07/03/21 1540  BP: (!) 167/63 (!) 174/63 (!) 172/83 (!) 155/66  Pulse: 79 70 92 89  Resp: 20 19 18 16   Temp: 98.8 F (37.1 C) 97.9 F (36.6 C) 98.2 F (36.8 C) 98.2 F (36.8 C)  TempSrc:  Oral    SpO2: 97% 97% 98% 97%  Weight:      Height:        Intake/Output Summary (Last 24 hours) at 07/03/2021 1803 Last data filed at 07/03/2021 [id number] Gross per 24 hour  Intake --  Output 500 ml  Net -500 ml   Filed Weights   07/02/21 1335  Weight: 83.8 kg    Examination:   Constitutional: NAD, AAOx3 HEENT: conjunctivae and lids normal, EOMI CV: No cyanosis.   RESP: normal respiratory effort, on RA Extremities: No effusions, edema in BLE SKIN: warm, dry Neuro: II - XII grossly intact.   Psych: Normal mood and affect.  Appropriate judgement and reason   Data Reviewed: I have personally reviewed following labs and imaging studies  CBC: Recent Labs  Lab 07/02/21 1339  WBC 8.8  NEUTROABS 5.3  HGB 15.2*  HCT 44.5  MCV 91.0  PLT  [id number]   Basic Metabolic Panel: Recent Labs  Lab 07/02/21 1339  NA 133*  K 3.7  CL 103  CO2 23  GLUCOSE 139*  BUN 16  CREATININE 0.83  CALCIUM 9.1   GFR: Estimated Creatinine Clearance: 45.2 mL/min (by C-G formula based on SCr of 0.83 mg/dL). Liver Function Tests: Recent Labs  Lab 07/02/21 1339  AST 30  ALT 21  ALKPHOS 73  BILITOT 0.8  PROT 7.2  ALBUMIN 3.9   No results for input(s): LIPASE, AMYLASE in the last 168 hours. No results for input(s): AMMONIA in the last 168 hours. Coagulation Profile: No results for input(s): INR, PROTIME in the last  168 hours. Cardiac Enzymes: No results for input(s): CKTOTAL, CKMB, CKMBINDEX, TROPONINI in the last 168 hours. BNP (last 3 results) No results for input(s): PROBNP in the last 8760 hours. HbA1C: No results for input(s): HGBA1C in the last 72 hours. CBG: No results for input(s): GLUCAP in the last 168 hours. Lipid Profile: No results for input(s): CHOL, HDL, LDLCALC, TRIG, CHOLHDL, LDLDIRECT in the last 72 hours. Thyroid Function Tests: No results for input(s): TSH, T4TOTAL, FREET4, T3FREE, THYROIDAB in the last 72 hours. Anemia Panel: No results for input(s): VITAMINB12, FOLATE, FERRITIN, TIBC, IRON, RETICCTPCT in the last 72 hours. Sepsis Labs: No results for input(s): PROCALCITON, LATICACIDVEN in the last 168 hours.  Recent Results (from the past 240 hour(s))  Resp Panel by RT-PCR (Flu A&B, Covid) Nasopharyngeal Swab     Status: None   Collection Time: 07/02/21  1:39 PM   Specimen: Nasopharyngeal Swab; Nasopharyngeal(NP) swabs in vial transport medium  Result Value Ref Range Status   SARS Coronavirus 2 by RT PCR NEGATIVE NEGATIVE Final    Comment: (NOTE) SARS-CoV-2 target nucleic acids are NOT DETECTED.  The SARS-CoV-2 RNA is generally detectable in upper respiratory specimens during the acute phase of infection. The lowest concentration of SARS-CoV-2 viral copies this assay can detect is 138 copies/mL. A negative result does not preclude SARS-Cov-2 infection and should not be used as the sole basis for treatment or other patient management decisions. A negative result may occur with  improper specimen collection/handling, submission of specimen other than nasopharyngeal swab, presence of viral mutation(s) within the areas targeted by this assay, and inadequate number of viral copies(<138 copies/mL). A negative result must be combined with clinical observations, patient history, and epidemiological information. The expected result is Negative.  Fact Sheet for Patients:   EntrepreneurPulse.com.au  Fact Sheet for Healthcare Providers:  IncredibleEmployment.be  This test is no t yet approved or cleared by the [place] FDA and  has been authorized for detection and/or diagnosis of SARS-CoV-2 by FDA under an Emergency Use Authorization (EUA). This EUA will remain  in effect (meaning this test can be used) for the duration of the COVID-19 declaration under Section 564(b)(1) of the Act, 21 U.S.C.section 360bbb-3(b)(1), unless the authorization is terminated  or revoked sooner.       Influenza A by PCR NEGATIVE NEGATIVE Final   Influenza B by PCR NEGATIVE NEGATIVE Final    Comment: (NOTE) The Xpert Xpress SARS-CoV-2/FLU/RSV plus assay is intended as an aid in the diagnosis of influenza from Nasopharyngeal swab specimens and should not be used as a sole basis for treatment. Nasal washings and aspirates are unacceptable for Xpert Xpress SARS-CoV-2/FLU/RSV testing.  Fact Sheet for Patients: EntrepreneurPulse.com.au  Fact Sheet for Healthcare Providers: IncredibleEmployment.be  This test is not yet approved or cleared by the [place] FDA and has been  authorized for detection and/or diagnosis of SARS-CoV-2 by FDA under an Emergency Use Authorization (EUA). This EUA will remain in effect (meaning this test can be used) for the duration of the COVID-19 declaration under Section 564(b)(1) of the Act, 21 U.S.C. section 360bbb-3(b)(1), unless the authorization is terminated or revoked.  Performed at [facility], [address] [place], [place] [address]       Radiology Studies: CT HEAD WO CONTRAST (5MM)  Result Date: 07/02/2021 CLINICAL DATA:  Dizziness EXAM: CT HEAD WITHOUT CONTRAST TECHNIQUE: Contiguous axial images were obtained from the base of the skull through the vertex without intravenous contrast. RADIATION DOSE REDUCTION: This exam was performed  according to the departmental dose-optimization program which includes automated exposure control, adjustment of the mA and/or kV according to patient size and/or use of iterative reconstruction technique. COMPARISON:  None. FINDINGS: Brain: Chronic white matter ischemic change. No evidence of acute infarction, hemorrhage, hydrocephalus, extra-axial collection or mass lesion/mass effect. Vascular: No hyperdense vessel or unexpected calcification. Skull: Normal. Negative for fracture or focal lesion. Sinuses/Orbits: No acute finding. Other: None. IMPRESSION: No acute intracranial abnormality. Electronically Signed   By: Yetta Glassman M.D.   On: 07/02/2021 16:34   MR BRAIN WO CONTRAST  Result Date: 07/02/2021 CLINICAL DATA:  Initial evaluation for acute dizziness. EXAM: MRI HEAD WITHOUT CONTRAST TECHNIQUE: Multiplanar, multiecho pulse sequences of the brain and surrounding structures were obtained without intravenous contrast. COMPARISON:  Head CT from earlier the same day. FINDINGS: Brain: Diffuse prominence of the CSF containing spaces compatible generalized cerebral atrophy. Extensive patchy and confluent T2/FLAIR hyperintensity involving the periventricular and deep white matter both cerebral hemispheres most consistent with chronic small vessel ischemic disease, moderate to advanced in nature. Mild patchy involvement of the pons. No evidence for acute or subacute infarct. Gray-white matter differentiation maintained. No areas of chronic cortical infarction. No evidence for acute or chronic intracranial hemorrhage. No mass lesion, midline shift or mass effect. No hydrocephalus or extra-axial fluid collection. Pituitary gland suprasellar region normal. Midline structures intact. Vascular: Irregular flow void within the left V4 segment, which could be related to slow flow and/or occlusion (series 16, image 6). Major intracranial vascular flow voids are otherwise maintained. Skull and upper cervical spine:  Craniocervical junction within normal limits. Bone marrow signal intensity normal. No scalp soft tissue abnormality. Sinuses/Orbits: Prior bilateral ocular lens replacement. Globes and orbital soft tissues demonstrate no acute finding. Paranasal sinuses are largely clear. No mastoid effusion. Other: None. IMPRESSION: 1. No acute intracranial abnormality. 2. Age-related cerebral atrophy with moderate to advanced chronic microvascular ischemic disease. 3. Irregular flow void within the left V4 segment, which could be related to slow flow and/or occlusion. Electronically Signed   By: Jeannine Boga M.D.   On: 07/02/2021 23:27     Scheduled Meds:  enoxaparin (LOVENOX) injection  0.5 mg/kg Subcutaneous Q24H   Continuous Infusions:   LOS: 0 days     Enzo Bi, MD [facility] If 7PM-7AM, please contact night-coverage 07/03/2021, 6:03 PM

## 2021-07-03 NOTE — Evaluation (Addendum)
Occupational Therapy Evaluation Patient Details Name: Eileen Walsh MRN: [id number] DOB: May 19, 1929 Today's Date: 07/03/2021   History of Present Illness Eileen Walsh is a 88 y.o. female with medical history significant for CAD not on aspirin statin or ACE/ARB due to prior side effects,, paroxysmal A. fib, isolated episode in 2020 with CHA2DS2-VASc score of 4, who declined recommendation for anticoagulation due to concerns for falls and bleeding, HTN, HLD who presents to the ED with a complaint of dizziness.  Patient has a longstanding history of dizziness/vertigo going back a few years, mostly positional for which she is intermittently use meclizine however on this occasion his symptoms do not appear to be resolving with meclizine or Dramamine.  The symptoms also appear more severe than usual and she has nausea but without vomiting with any change of position.  She denies headache, one-sided weakness numbness or tingling, difficulties with speech or swallowing denies abdominal pain, chest pain, fever, chills.   Clinical Impression   Eileen Walsh was seen for OT evaluation this date. Prior to hospital admission, pt was MOD I for mobility using SPC and ADLs including driving. Pt lives alone in home c 5 STE. Pt presents to acute OT demonstrating impaired ADL performance and functional mobility 2/2 decreased activity tolerance and functional balance deficits. Pt currently requires multiple trials to achieve upright standing - initially attempts to tie robe however posterior LOB with reaching behind waist, small improvement with cues for impulsivity. CGA + RW for grooming standing sinkside, however x2 posterior LOBs requiring MOD A to correct when pt reaching overhead. Pt demonstrated poor carryover of education on falls prevention, daughter at bedside agreeable to home modifications to remove items from above eye height shelves. Pt would benefit from skilled OT to address noted impairments and  functional limitations (see below for any additional details). Upon hospital discharge, recommend HHOT to maximize pt safety and return to PLOF.       Recommendations for follow up therapy are one component of a multi-disciplinary discharge planning process, led by the attending physician.  Recommendations may be updated based on patient status, additional functional criteria and insurance authorization.   Follow Up Recommendations  Home health OT    Assistance Recommended at Discharge Intermittent Supervision/Assistance  Patient can return home with the following A little help with walking and/or transfers;Assistance with cooking/housework    Functional Status Assessment  Patient has had a recent decline in their functional status and demonstrates the ability to make significant improvements in function in a reasonable and predictable amount of time.  Equipment Recommendations  BSC/3in1    Recommendations for Other Services       Precautions / Restrictions Precautions Precautions: Fall Restrictions Weight Bearing Restrictions: No      Mobility Bed Mobility Overal bed mobility: Modified Independent             General bed mobility comments: increased time    Transfers Overall transfer level: Needs assistance Equipment used: Rolling walker (2 wheels) Transfers: Sit to/from Stand Sit to Stand: Min guard                  Balance Overall balance assessment: Needs assistance Sitting-balance support: Feet supported Sitting balance-Leahy Scale: Good     Standing balance support: No upper extremity supported, During functional activity Standing balance-Leahy Scale: Poor Standing balance comment: posterior LOBs x2  ADL either performed or assessed with clinical judgement   ADL Overall ADL's : Needs assistance/impaired                                       General ADL Comments: CGA for ADL t/f and grooming  standing sinkside, x2 posterior LOBs requiring MOD A to correct with reaching overhead - pt impulsive and repeats action      Pertinent Vitals/Pain Pain Assessment Pain Assessment: No/denies pain     Hand Dominance     Extremity/Trunk Assessment Upper Extremity Assessment Upper Extremity Assessment: Overall WFL for tasks assessed   Lower Extremity Assessment Lower Extremity Assessment: Overall WFL for tasks assessed   Cervical / Trunk Assessment Cervical / Trunk Assessment: Normal   Communication Communication Communication: No difficulties   Cognition Arousal/Alertness: Awake/alert Behavior During Therapy: Impulsive Overall Cognitive Status: Within Functional Limits for tasks assessed                                                  Home Living Family/patient expects to be discharged to:: Private residence Living Arrangements: Alone Available Help at Discharge: Family;Available PRN/intermittently Type of Home: House Home Access: Stairs to enter [employer] of Steps: 5 Entrance Stairs-Rails: Right;Left Home Layout: One level     Bathroom Shower/Tub: Occupational psychologist: Handicapped height     Home Equipment: Rollator (4 wheels);Cane - single point          Prior Functioning/Environment Prior Level of Function : Independent/Modified Independent;Driving             Mobility Comments: uses cane at baseline ADLs Comments: independent at baseline        OT Problem List: Decreased activity tolerance;Impaired balance (sitting and/or standing);Decreased safety awareness      OT Treatment/Interventions: Self-care/ADL training;Therapeutic exercise;DME and/or AE instruction;Energy conservation;Therapeutic activities;Patient/family education;Balance training    OT Goals(Current goals can be found in the care plan section) Acute Rehab OT Goals Patient Stated Goal: to go home tomorrow OT Goal Formulation: With  patient/family Time For Goal Achievement: 07/17/21 Potential to Achieve Goals: Good ADL Goals Pt Will Perform Grooming: Independently;standing Pt Will Perform Toileting - Clothing Manipulation and hygiene: Independently;sitting/lateral leans Pt Will Perform Tub/Shower Transfer: Shower transfer;Independently  OT Frequency: Min 2X/week    Co-evaluation              AM-PAC OT "6 Clicks" Daily Activity     Outcome Measure Help from another person eating meals?: None Help from another person taking care of personal grooming?: A Little Help from another person toileting, which includes using toliet, bedpan, or urinal?: A Little Help from another person bathing (including washing, rinsing, drying)?: A Little Help from another person to put on and taking off regular upper body clothing?: None Help from another person to put on and taking off regular lower body clothing?: None 6 Click Score: 21   End of Session Equipment Utilized During Treatment: Rolling walker (2 wheels) Nurse Communication: Mobility status  Activity Tolerance: Patient tolerated treatment well Patient left: in bed;with call bell/phone within reach;with bed alarm set;with family/visitor present  OT Visit Diagnosis: Other abnormalities of gait and mobility (R26.89)  [id number] OT Time Calculation (min): 25 min Charges:  OT General Charges $OT Visit: 1 Visit OT Evaluation $OT Eval Low Complexity: 1 Low OT Treatments $Self Care/Home Management : 8-22 mins  Dessie Coma, M.S. OTR/L  07/03/21, 4:36 PM  ascom [phone]

## 2021-07-04 ENCOUNTER — Telehealth: Payer: Self-pay

## 2021-07-04 DIAGNOSIS — R531 Weakness: Secondary | ICD-10-CM | POA: Diagnosis not present

## 2021-07-04 MED ORDER — MECLIZINE HCL 25 MG PO TABS: 25.0000 mg | ORAL_TABLET | Freq: Two times a day (BID) | ORAL | 0 refills | Status: DC | PRN

## 2021-07-04 MED ORDER — NAPROXEN SODIUM 220 MG PO TABS: 220.0000 mg | ORAL_TABLET | Freq: Every day | ORAL | Status: DC | PRN

## 2021-07-04 NOTE — Telephone Encounter (Signed)
Copied from CRM [id number]. Topic: General - Other >> Jul 04, 2021  4:08 PM Fields, Hospital doctor R wrote: Reason for CRM: Riley Churches from [facility] calling to get orders for patient they need both OT AND PT.

## 2021-07-04 NOTE — Progress Notes (Signed)
Physical Therapy Treatment Patient Details Name: Eileen Walsh MRN: [id number] DOB: June 23, 1928 Today's Date: 07/04/2021   History of Present Illness Eileen Walsh is a 88 y.o. female with medical history significant for CAD not on aspirin statin or ACE/ARB due to prior side effects,, paroxysmal A. fib, isolated episode in 2020 with CHA2DS2-VASc score of 4, who declined recommendation for anticoagulation due to concerns for falls and bleeding, HTN, HLD who presents to the ED with a complaint of dizziness.  Patient has a longstanding history of dizziness/vertigo going back a few years, mostly positional for which she is intermittently use meclizine however on this occasion his symptoms do not appear to be resolving with meclizine or Dramamine.  The symptoms also appear more severe than usual and she has nausea but without vomiting with any change of position.  She denies headache, one-sided weakness numbness or tingling, difficulties with speech or swallowing denies abdominal pain, chest pain, fever, chills.    PT Comments    Pt seen for PT tx with pt verbalizing many times throughout the session that she's ready to go home with decreases awareness & decreased receptiveness re: recommendation for supervision/assist at d/c despite PT educating pt on this. Pt completes bed mobility with mod I & attempts to ambulate with SPC but requires min assist 2/2 unsteadiness with gait. Pt willing to use RW but requires encouragement to ambulate into the hallway. Pt is able to ambulate linear path with supervision, CGA for turns. Returned to room & completed BPPV testing with bed in trendelenburg position with pt demonstrating nystagmus when head is rotated to the R indicative of R posterior canal BPPV. Assisted pt to supine & flattened bed out & attempted to perform dix-hallpike maneuver with bed flat & head extended off EOB but pt c/o dizziness & nausea & returned to semi-fowler in bed. MD notified of pt's c/o  tenderness in posterior neck at base of head; pt denied lightheadedness & changes in vision during testing. Pt would benefit from further PT treatment to address balance & safety with mobility & gait with LRAD, as well as continued tx for BPPV.     Recommendations for follow up therapy are one component of a multi-disciplinary discharge planning process, led by the attending physician.  Recommendations may be updated based on patient status, additional functional criteria and insurance authorization.  Follow Up Recommendations  Home health PT     Assistance Recommended at Discharge Frequent or constant Supervision/Assistance  Patient can return home with the following A little help with walking and/or transfers;A little help with bathing/dressing/bathroom;Help with stairs or ramp for entrance;Assistance with cooking/housework;Assist for transportation   Equipment Recommendations  None recommended by PT;Other (comment) (pt has rollator at home)    Recommendations for Other Services       Precautions / Restrictions Precautions Precautions: Fall Restrictions Weight Bearing Restrictions: No     Mobility  Bed Mobility Overal bed mobility: Modified Independent             General bed mobility comments: use of bed rails PRN    Transfers Overall transfer level: Needs assistance Equipment used: Straight cane (pulls to stand with LUE) Transfers: Sit to/from Stand Sit to Stand: Min assist           General transfer comment: increased time, posterior lean onto EOB, requires LUE support as well while RUE holds to cane    Ambulation/Gait Ambulation/Gait assistance: Min assist, Supervision Gait Distance (Feet):  (5 ft + 75 ft) Assistive  device: Straight cane, Rolling walker (2 wheels) Gait Pattern/deviations: Decreased step length - right, Decreased step length - left, Decreased stride length Gait velocity: slightly decreased.     General Gait Details: Pt initiates  ambulation with SPC but demonstrates decreased step length BLE & lateral sway. Educated pt on importance of use of RW & pt agreeable to switching to RW. Pt able to ambulate with RW & close supervision.   Stairs             Wheelchair Mobility    Modified Rankin (Stroke Patients Only)       Balance Overall balance assessment: Needs assistance Sitting-balance support: Feet supported Sitting balance-Leahy Scale: Fair     Standing balance support: No upper extremity supported, During functional activity Standing balance-Leahy Scale: Poor                              Cognition Arousal/Alertness: Awake/alert Behavior During Therapy: Impulsive Overall Cognitive Status: Within Functional Limits for tasks assessed                                 General Comments: Internally distracted, eager to go home & not very receptive of education/PT recommendations.        Exercises      General Comments General comments (skin integrity, edema, etc.): Pt sitting on elevated EOB during BPPV testing with BLE off of floor & pt with anterior slide forward with PT assisting to prevent fall 2/2 pt's impulsivity & decreased safety awareness.      Pertinent Vitals/Pain Pain Assessment Pain Assessment: No/denies pain    Home Living                          Prior Function            PT Goals (current goals can now be found in the care plan section) Acute Rehab PT Goals Patient Stated Goal: to go home PT Goal Formulation: With patient/family Time For Goal Achievement: 07/10/21 Potential to Achieve Goals: Fair Progress towards PT goals: Progressing toward goals    Frequency    Min 2X/week      PT Plan Current plan remains appropriate    Co-evaluation              AM-PAC PT "6 Clicks" Mobility   Outcome Measure  Help needed turning from your back to your side while in a flat bed without using bedrails?: None Help needed moving  from lying on your back to sitting on the side of a flat bed without using bedrails?: None Help needed moving to and from a bed to a chair (including a wheelchair)?: A Little Help needed standing up from a chair using your arms (e.g., wheelchair or bedside chair)?: A Little Help needed to walk in hospital room?: A Little Help needed climbing 3-5 steps with a railing? : A Little 6 Click Score: 20    End of Session Equipment Utilized During Treatment: Gait belt Activity Tolerance:  (limited by symptoms with BPPV testing) Patient left: in bed;with call bell/phone within reach;with bed alarm set Nurse Communication:  (MD made aware of performance during session) PT Visit Diagnosis: Unsteadiness on feet (R26.81)     Time: [id number] PT Time Calculation (min) (ACUTE ONLY): 27 min  Charges:  $Therapeutic Activity: 23-37 mins  Lavone Nian, PT, DPT 07/04/21, 10:55 AM    Waunita Schooner 07/04/2021, 10:49 AM

## 2021-07-04 NOTE — TOC Progression Note (Signed)
Transition of Care [facility]) - Progression Note    Patient Details  Name: MCKINSEY KEAGLE MRN: [id number] Date of Birth: Jun 17, 1928  Transition [facility] Care [facility] CM/SW Contact  Marlowe Sax, RN Phone Number: 07/04/2021, 9:09 AM  Clinical Narrative:   The patient will need Home health for PT and OT, I reached out to [facility] to provide the referral, awaiting a response    Expected Discharge Plan: Home w Home Health Services Barriers to Discharge: Continued Medical Work up  Expected Discharge Plan and Services Expected Discharge Plan: Home w Home Health Services       Living arrangements for the past 2 months: Single Family Home                 DME Arranged: N/A         HH Arranged: NA           Social Determinants of Health (SDOH) Interventions    Readmission Risk Interventions No flowsheet data found.

## 2021-07-04 NOTE — Plan of Care (Signed)
Patient sleeping between care. Aox4. No complaints of pain or discomfort. No new changes in assessment. Call bell within reach.  PLAN OF CARE ONGOING Problem: Education: Goal: Knowledge of General Education information will improve Description: Including pain rating scale, medication(s)/side effects and non-pharmacologic comfort measures Outcome: Progressing   Problem: Health Behavior/Discharge Planning: Goal: Ability to manage health-related needs will improve Outcome: Progressing   Problem: Clinical Measurements: Goal: Ability to maintain clinical measurements within normal limits will improve Outcome: Progressing Goal: Will remain free from infection Outcome: Progressing Goal: Diagnostic test results will improve Outcome: Progressing Goal: Respiratory complications will improve Outcome: Progressing Goal: Cardiovascular complication will be avoided Outcome: Progressing   Problem: Activity: Goal: Risk for activity intolerance will decrease Outcome: Progressing   Problem: Nutrition: Goal: Adequate nutrition will be maintained Outcome: Progressing   Problem: Coping: Goal: Level of anxiety will decrease Outcome: Progressing   Problem: Elimination: Goal: Will not experience complications related to bowel motility Outcome: Progressing Goal: Will not experience complications related to urinary retention Outcome: Progressing   Problem: Pain Managment: Goal: General experience of comfort will improve Outcome: Progressing   Problem: Safety: Goal: Ability to remain free from injury will improve Outcome: Progressing   Problem: Skin Integrity: Goal: Risk for impaired skin integrity will decrease Outcome: Progressing

## 2021-07-04 NOTE — Telephone Encounter (Signed)
Please advise verbal orders? 

## 2021-07-04 NOTE — Discharge Summary (Signed)
Physician Discharge Summary   Eileen Walsh  female DOB: December 05, 1928  [id number]  PCP: Jerrol Banana., MD  Admit date: 07/02/2021 Discharge date: 07/04/2021  Admitted From: home Disposition:  home Home Health: Yes CODE STATUS: Full code   Hospital Course:  For full details, please see H&P, progress notes, consult notes and ancillary notes.  Briefly,  Eileen Walsh is a 88 y.o. female with medical history significant for CAD not on aspirin statin or ACE/ARB due to prior side effects, paroxysmal A. fib, isolated episode in 2020 with CHA2DS2-VASc score of 4, who declined recommendation for anticoagulation due to concerns for falls and bleeding, HTN, who presented to the ED with a complaint of dizziness.    Patient has a longstanding history of dizziness/vertigo, mostly positional for which she is intermittently use meclizine however on this occasion symptoms did not appear to be resolving with meclizine or Dramamine.  The symptoms also appear more severe than usual and she had nausea but without vomiting with any change of position.   Dizziness 2/2 R posterior canal BPPV --pt reported about 4 episodes of dizziness in her lifetime, none lasted as long as the current episode. - CT head negative, MRI neg for acute finding. --PT dx R posterior canal BPPV.  --HH PT ordered for further treatment and teaching of exercises for BPPV.     Essential (primary) hypertension --cont home amlodipine and olmesartan     CAD (coronary artery disease) - No complaints of chest pain - Patient is not on any related meds due to prior side effects, per prior documentation and cardiology notes     AF (paroxysmal atrial fibrillation) ([place]) - Not on anticoagulation per patient preference   GERD --cont home PPI   Discharge Diagnoses:  Principal Problem:   Generalized weakness Active Problems:   Essential (primary) hypertension   Vertigo   CAD (coronary artery disease)   AF  (paroxysmal atrial fibrillation) ([place])     Discharge Instructions:  Allergies as of 07/04/2021       Reactions   Aspirin Other (See Comments)   Sulfa Antibiotics Hives, Other (See Comments)        Medication List     STOP taking these medications    predniSONE 5 MG tablet Commonly known as: DELTASONE       TAKE these medications    amLODipine-olmesartan 5-40 MG tablet Commonly known as: AZOR Take 1 tablet by mouth daily.   clobetasol 0.05 % external solution Commonly known as: TEMOVATE Apply 1 application topically 2 (two) times daily. Mix 1 bottle into 1 tub of cerave cream and use qd/bid aa rash on back, sides, buttocks until clear   meclizine 25 MG tablet Commonly known as: ANTIVERT Take 1 tablet (25 mg total) by mouth 2 (two) times daily as needed for dizziness.   multivitamin with minerals Tabs tablet Take 1 tablet by mouth daily.   naproxen sodium 220 MG tablet Commonly known as: ALEVE Take 1 tablet (220 mg total) by mouth daily as needed. Home med. What changed:  when to take this reasons to take this additional instructions   omeprazole 20 MG capsule Commonly known as: PRILOSEC TAKE 1 CAPSULE IN THE MORNING         Follow-up Information     Jerrol Banana., MD Follow up in 1 week(s).   Specialty: Family Medicine Contact information: [address] [place] [address] [phone]  Allergies  Allergen Reactions   Aspirin Other (See Comments)   Sulfa Antibiotics Hives and Other (See Comments)     The results of significant diagnostics from this hospitalization (including imaging, microbiology, ancillary and laboratory) are listed below for reference.   Consultations:   Procedures/Studies: CT HEAD WO CONTRAST (5MM)  Result Date: 07/02/2021 CLINICAL DATA:  Dizziness EXAM: CT HEAD WITHOUT CONTRAST TECHNIQUE: Contiguous axial images were obtained from the base of the skull through the  vertex without intravenous contrast. RADIATION DOSE REDUCTION: This exam was performed according to the departmental dose-optimization program which includes automated exposure control, adjustment of the mA and/or kV according to patient size and/or use of iterative reconstruction technique. COMPARISON:  None. FINDINGS: Brain: Chronic white matter ischemic change. No evidence of acute infarction, hemorrhage, hydrocephalus, extra-axial collection or mass lesion/mass effect. Vascular: No hyperdense vessel or unexpected calcification. Skull: Normal. Negative for fracture or focal lesion. Sinuses/Orbits: No acute finding. Other: None. IMPRESSION: No acute intracranial abnormality. Electronically Signed   By: Yetta Glassman M.D.   On: 07/02/2021 16:34   MR BRAIN WO CONTRAST  Result Date: 07/02/2021 CLINICAL DATA:  Initial evaluation for acute dizziness. EXAM: MRI HEAD WITHOUT CONTRAST TECHNIQUE: Multiplanar, multiecho pulse sequences of the brain and surrounding structures were obtained without intravenous contrast. COMPARISON:  Head CT from earlier the same day. FINDINGS: Brain: Diffuse prominence of the CSF containing spaces compatible generalized cerebral atrophy. Extensive patchy and confluent T2/FLAIR hyperintensity involving the periventricular and deep white matter both cerebral hemispheres most consistent with chronic small vessel ischemic disease, moderate to advanced in nature. Mild patchy involvement of the pons. No evidence for acute or subacute infarct. Gray-white matter differentiation maintained. No areas of chronic cortical infarction. No evidence for acute or chronic intracranial hemorrhage. No mass lesion, midline shift or mass effect. No hydrocephalus or extra-axial fluid collection. Pituitary gland suprasellar region normal. Midline structures intact. Vascular: Irregular flow void within the left V4 segment, which could be related to slow flow and/or occlusion (series 16, image 6). Major  intracranial vascular flow voids are otherwise maintained. Skull and upper cervical spine: Craniocervical junction within normal limits. Bone marrow signal intensity normal. No scalp soft tissue abnormality. Sinuses/Orbits: Prior bilateral ocular lens replacement. Globes and orbital soft tissues demonstrate no acute finding. Paranasal sinuses are largely clear. No mastoid effusion. Other: None. IMPRESSION: 1. No acute intracranial abnormality. 2. Age-related cerebral atrophy with moderate to advanced chronic microvascular ischemic disease. 3. Irregular flow void within the left V4 segment, which could be related to slow flow and/or occlusion. Electronically Signed   By: Jeannine Boga M.D.   On: 07/02/2021 23:27      Labs: BNP (last 3 results) No results for input(s): BNP in the last 8760 hours. Basic Metabolic Panel: Recent Labs  Lab 07/02/21 1339  NA 133*  K 3.7  CL 103  CO2 23  GLUCOSE 139*  BUN 16  CREATININE 0.83  CALCIUM 9.1   Liver Function Tests: Recent Labs  Lab 07/02/21 1339  AST 30  ALT 21  ALKPHOS 73  BILITOT 0.8  PROT 7.2  ALBUMIN 3.9   No results for input(s): LIPASE, AMYLASE in the last 168 hours. No results for input(s): AMMONIA in the last 168 hours. CBC: Recent Labs  Lab 07/02/21 1339  WBC 8.8  NEUTROABS 5.3  HGB 15.2*  HCT 44.5  MCV 91.0  PLT 247   Cardiac Enzymes: No results for input(s): CKTOTAL, CKMB, CKMBINDEX, TROPONINI in the last 168 hours. BNP: Invalid  input(s): POCBNP CBG: No results for input(s): GLUCAP in the last 168 hours. D-Dimer No results for input(s): DDIMER in the last 72 hours. Hgb A1c No results for input(s): HGBA1C in the last 72 hours. Lipid Profile No results for input(s): CHOL, HDL, LDLCALC, TRIG, CHOLHDL, LDLDIRECT in the last 72 hours. Thyroid function studies No results for input(s): TSH, T4TOTAL, T3FREE, THYROIDAB in the last 72 hours.  Invalid input(s): FREET3 Anemia work up No results for input(s):  VITAMINB12, FOLATE, FERRITIN, TIBC, IRON, RETICCTPCT in the last 72 hours. Urinalysis    Component Value Date/Time   COLORURINE YELLOW (A) 07/02/2021 1454   APPEARANCEUR CLEAR (A) 07/02/2021 1454   LABSPEC 1.012 07/02/2021 1454   PHURINE 6.0 07/02/2021 1454   GLUCOSEU 50 (A) 07/02/2021 1454   HGBUR NEGATIVE 07/02/2021 1454   BILIRUBINUR NEGATIVE 07/02/2021 1454   KETONESUR 5 (A) 07/02/2021 1454   PROTEINUR NEGATIVE 07/02/2021 1454   NITRITE NEGATIVE 07/02/2021 1454   LEUKOCYTESUR TRACE (A) 07/02/2021 1454   Sepsis Labs Invalid input(s): PROCALCITONIN,  WBC,  LACTICIDVEN Microbiology Recent Results (from the past 240 hour(s))  Resp Panel by RT-PCR (Flu A&B, Covid) Nasopharyngeal Swab     Status: None   Collection Time: 07/02/21  1:39 PM   Specimen: Nasopharyngeal Swab; Nasopharyngeal(NP) swabs in vial transport medium  Result Value Ref Range Status   SARS Coronavirus 2 by RT PCR NEGATIVE NEGATIVE Final    Comment: (NOTE) SARS-CoV-2 target nucleic acids are NOT DETECTED.  The SARS-CoV-2 RNA is generally detectable in upper respiratory specimens during the acute phase of infection. The lowest concentration of SARS-CoV-2 viral copies this assay can detect is 138 copies/mL. A negative result does not preclude SARS-Cov-2 infection and should not be used as the sole basis for treatment or other patient management decisions. A negative result may occur with  improper specimen collection/handling, submission of specimen other than nasopharyngeal swab, presence of viral mutation(s) within the areas targeted by this assay, and inadequate number of viral copies(<138 copies/mL). A negative result must be combined with clinical observations, patient history, and epidemiological information. The expected result is Negative.  Fact Sheet for Patients:  EntrepreneurPulse.com.au  Fact Sheet for Healthcare Providers:  IncredibleEmployment.be  This test is  no t yet approved or cleared by the [place] FDA and  has been authorized for detection and/or diagnosis of SARS-CoV-2 by FDA under an Emergency Use Authorization (EUA). This EUA will remain  in effect (meaning this test can be used) for the duration of the COVID-19 declaration under Section 564(b)(1) of the Act, 21 U.S.C.section 360bbb-3(b)(1), unless the authorization is terminated  or revoked sooner.       Influenza A by PCR NEGATIVE NEGATIVE Final   Influenza B by PCR NEGATIVE NEGATIVE Final    Comment: (NOTE) The Xpert Xpress SARS-CoV-2/FLU/RSV plus assay is intended as an aid in the diagnosis of influenza from Nasopharyngeal swab specimens and should not be used as a sole basis for treatment. Nasal washings and aspirates are unacceptable for Xpert Xpress SARS-CoV-2/FLU/RSV testing.  Fact Sheet for Patients: EntrepreneurPulse.com.au  Fact Sheet for Healthcare Providers: IncredibleEmployment.be  This test is not yet approved or cleared by the [place] FDA and has been authorized for detection and/or diagnosis of SARS-CoV-2 by FDA under an Emergency Use Authorization (EUA). This EUA will remain in effect (meaning this test can be used) for the duration of the COVID-19 declaration under Section 564(b)(1) of the Act, 21 U.S.C. section 360bbb-3(b)(1), unless the authorization is terminated or revoked.  Performed at [facility], [address] [place], [place] [address]      Total time spend on discharging this patient, including the last patient exam, discussing the hospital stay, instructions for ongoing care as it relates to all pertinent caregivers, as well as preparing the medical discharge records, prescriptions, and/or referrals as applicable, is 45 minutes.    Enzo Bi, MD  [facility] 07/04/2021, 10:37 AM

## 2021-07-07 ENCOUNTER — Telehealth: Payer: Self-pay

## 2021-07-07 NOTE — Telephone Encounter (Signed)
Verbal orders were given 

## 2021-07-08 NOTE — Telephone Encounter (Signed)
Transition Care Management Follow-up Telephone Call Date of discharge and from where: TCM DC [facility] 07-04-21 Dx: generalized weakness How have you been since you were released from the hospital? Feeling better  Any questions or concerns? No  Items Reviewed: Did the pt receive and understand the discharge instructions provided? Yes  Medications obtained and verified? Yes  Other? No  Any new allergies since your discharge? No  Dietary orders reviewed? Yes Do you have support at home? Yes   Home Care and Equipment/Supplies: Were home health services ordered? no If so, what is the name of the agency? na  Has the agency set up a time to come to the patient's home? not applicable Were any new equipment or medical supplies ordered?  No What is the name of the medical supply agency? na Were you able to get the supplies/equipment? not applicable Do you have any questions related to the use of the equipment or supplies? No  Functional Questionnaire: (I = Independent and D = Dependent) ADLs: I  Bathing/Dressing- I  Meal Prep- I  Eating- I  Maintaining continence- I  Transferring/Ambulation- I  Managing Meds- I  Follow up appointments reviewed:  PCP Hospital f/u appt confirmed? Yes  Scheduled to see Dr Linwood Dibbles on 07-15-21 @ 120pm. Specialist [facility] f/u appt confirmed? No . Are transportation arrangements needed? No  If their condition worsens, is the pt aware to call PCP or go to the Emergency Dept.? Yes Was the patient provided with contact information for the PCP's office or ED? Yes Was to pt encouraged to call back with questions or concerns? Yes

## 2021-07-15 ENCOUNTER — Other Ambulatory Visit: Payer: Self-pay

## 2021-07-15 ENCOUNTER — Ambulatory Visit (INDEPENDENT_AMBULATORY_CARE_PROVIDER_SITE_OTHER): Payer: Medicare Other | Admitting: Family Medicine

## 2021-07-15 ENCOUNTER — Encounter: Payer: Self-pay | Admitting: Family Medicine

## 2021-07-15 VITALS — BP 137/76 | HR 82 | Temp 98.2°F | Wt 171.4 lb

## 2021-07-15 DIAGNOSIS — R42 Dizziness and giddiness: Secondary | ICD-10-CM

## 2021-07-15 DIAGNOSIS — E871 Hypo-osmolality and hyponatremia: Secondary | ICD-10-CM

## 2021-07-15 NOTE — Assessment & Plan Note (Signed)
Self-resolved. Will obtain BMP given slight hyponatremia during hospitalization. Recommend to call if dizziness returns, recommend neuro/vestibular PT for Epley maneuver.  F/u as scheduled.

## 2021-07-15 NOTE — Progress Notes (Signed)
° °  SUBJECTIVE:   CHIEF COMPLAINT / HPI:   HOSPITAL FOLLOW UP  Hospital/facility: [facility] 1/18-1/20 Diagnosis: dizziness 2/2 BPPV, generalized weakness Procedures/tests:  - CT Head negative - MRI brain NAICA Consultants: none New medications: none Discharge instructions:   - HHPT for BPPV Status: better - had HHPT come out but unable to do Epley maneuver.  - report dizziness self resolved on Monday, hasn't taken meclizine since.   OBJECTIVE:   BP 137/76 (BP Location: Right Arm, Patient Position: Sitting, Cuff Size: Large)    Pulse 82    Temp 98.2 F (36.8 C) (Temporal)    Wt 171 lb 6.4 oz (77.7 kg)    HC 62" (157.5 cm)    SpO2 97%    BMI 31.35 kg/m   Gen: well appearing, in NAD Card: RRR Lungs: CTAB Ext: WWP, no edema  ASSESSMENT/PLAN:   Vertigo Self-resolved. Will obtain BMP given slight hyponatremia during hospitalization. Recommend to call if dizziness returns, recommend neuro/vestibular PT for Epley maneuver.  F/u as scheduled.      Caro Laroche, DO

## 2021-07-16 LAB — BASIC METABOLIC PANEL
BUN/Creatinine Ratio: 18 (ref 12–28)
BUN: 18 mg/dL (ref 10–36)
CO2: 23 mmol/L (ref 20–29)
Calcium: 10 mg/dL (ref 8.7–10.3)
Chloride: 101 mmol/L (ref 96–106)
Creatinine, Ser: 1.01 mg/dL — ABNORMAL HIGH (ref 0.57–1.00)
Glucose: 97 mg/dL (ref 70–99)
Potassium: 4.6 mmol/L (ref 3.5–5.2)
Sodium: 140 mmol/L (ref 134–144)
eGFR: 53 mL/min/{1.73_m2} — ABNORMAL LOW (ref >59)

## 2021-07-25 ENCOUNTER — Ambulatory Visit (INDEPENDENT_AMBULATORY_CARE_PROVIDER_SITE_OTHER): Payer: Medicare Other | Admitting: Physician Assistant

## 2021-07-25 ENCOUNTER — Encounter: Payer: Self-pay | Admitting: Physician Assistant

## 2021-07-25 ENCOUNTER — Other Ambulatory Visit: Payer: Self-pay

## 2021-07-25 VITALS — BP 125/60 | HR 79 | Temp 98.7°F | Resp 16

## 2021-07-25 DIAGNOSIS — R0989 Other specified symptoms and signs involving the circulatory and respiratory systems: Secondary | ICD-10-CM | POA: Diagnosis not present

## 2021-07-25 MED ORDER — BENZONATATE 100 MG PO CAPS: 100.0000 mg | ORAL_CAPSULE | Freq: Two times a day (BID) | ORAL | 0 refills | Status: DC | PRN

## 2021-07-25 NOTE — Progress Notes (Signed)
Established patient visit   Patient: Eileen Walsh   DOB: 1928/10/25   88 y.o. Female  MRN: [id number] Visit Date: 07/25/2021  Today's healthcare provider: Oswaldo Conroy Inara Dike, PA-C  Introduced myself to the patient as a Secondary school teacher and provided education on APPs in clinical practice.    Chief Complaint  Patient presents with   URI   Subjective    URI  Associated symptoms include congestion, coughing, diarrhea, ear pain, headaches, rhinorrhea, sneezing and a sore throat. Pertinent negatives include no nausea, neck pain, rash, sinus pain or vomiting.  HPI     URI           URI symptoms: achiness, congestion, cough, ear pain, plugged ear sensation, rhinorrhea and sneezing   Onset: in the past 7 days   Progression since onset: unchanged since onset   Fever: temperature has been with in normal range   Hydration: is drinking plenty of fluids   Smoker: not a smoker       Last edited by Fonda Kinder, CMA on 07/25/2021 11:25 AM.      States her symptoms started over the weekend and seem to be progressively improving  States she started with sneezing, coughing and congestion with rhinorrhea. Has developed diarrhea over the past two days She is taking Robitussin and Aleve for symptom management States her main symptom burden at night is coughing        Medications: Outpatient Medications Prior to Visit  Medication Sig   amLODipine-olmesartan (AZOR) 5-40 MG tablet Take 1 tablet by mouth daily.   clobetasol (TEMOVATE) 0.05 % external solution Apply 1 application topically 2 (two) times daily. Mix 1 bottle into 1 tub of cerave cream and use qd/bid aa rash on back, sides, buttocks until clear   Multiple Vitamin (MULTIVITAMIN WITH MINERALS) TABS tablet Take 1 tablet by mouth daily.   naproxen sodium (ALEVE) 220 MG tablet Take 1 tablet (220 mg total) by mouth daily as needed. Home med.   omeprazole (PRILOSEC) 20 MG capsule TAKE 1 CAPSULE IN THE MORNING   meclizine (ANTIVERT)  25 MG tablet Take 1 tablet (25 mg total) by mouth 2 (two) times daily as needed for dizziness. (Patient not taking: Reported on 07/25/2021)   No facility-administered medications prior to visit.    Review of Systems  Constitutional:  Positive for fatigue and fever.  HENT:  Positive for congestion, ear pain, rhinorrhea, sneezing and sore throat. Negative for sinus pressure, sinus pain and trouble swallowing.   Respiratory:  Positive for cough.   Gastrointestinal:  Positive for diarrhea. Negative for nausea and vomiting.  Musculoskeletal:  Negative for arthralgias, myalgias, neck pain and neck stiffness.  Skin:  Negative for rash.  Neurological:  Positive for light-headedness and headaches. Negative for dizziness and weakness.      Objective    BP 125/60    Pulse 79    Temp 98.7 F (37.1 C) (Oral)    Resp 16    SpO2 97%  {Show previous vital signs (optional):23777}  Physical Exam Vitals reviewed.  Constitutional:      General: She is awake.     Appearance: Normal appearance. She is well-developed and well-groomed.  HENT:     Head: Normocephalic and atraumatic.     Right Ear: Tympanic membrane, ear canal and external ear normal.     Left Ear: Tympanic membrane, ear canal and external ear normal.     Nose:     Right Turbinates: Enlarged. Not  pale.     Left Turbinates: Enlarged. Not pale.     Mouth/Throat:     Mouth: Mucous membranes are moist.     Pharynx: Oropharynx is clear. Uvula midline. No pharyngeal swelling, oropharyngeal exudate or posterior oropharyngeal erythema.  Cardiovascular:     Rate and Rhythm: Normal rate and regular rhythm.     Pulses: Normal pulses.     Heart sounds: Normal heart sounds.  Pulmonary:     Effort: Pulmonary effort is normal.     Breath sounds: Normal breath sounds and air entry. No decreased breath sounds, wheezing, rhonchi or rales.  Lymphadenopathy:     Head:     Right side of head: No submental or submandibular adenopathy.     Left side of  head: No submental or submandibular adenopathy.     Cervical: Cervical adenopathy present.     Right cervical: Superficial cervical adenopathy present.     Left cervical: Superficial cervical adenopathy present.     Upper Body:     Right upper body: No supraclavicular adenopathy.     Left upper body: No supraclavicular adenopathy.  Neurological:     Mental Status: She is alert.  Psychiatric:        Mood and Affect: Mood normal.        Behavior: Behavior normal. Behavior is cooperative.        Thought Content: Thought content normal.        Judgment: Judgment normal.      No results found for any visits on 07/25/21.  Assessment & Plan     Problem List Items Addressed This Visit   None Visit Diagnoses     Symptoms of upper respiratory infection (URI)    -  Primary Visit with patient indicates symptoms comprised of congestion, sneezing, coughing, diarrhea congruent with acute URI that is likely viral in nature but possibility of flu, COVID, and bacterial URI cannot be completely ruled out at this time.  Due to nature and duration of symptoms recommended treatment regimen is symptomatic relief and follow up if needed Discussed with patient the various viral and bacterial etiologies of current illness and appropriate course of treatment Discussed OTC medication options for multisymptom relief such as Dayquil/Nyquil, Theraflu, AlkaSeltzer, etc. Discussed return precautions if symptoms are not improving or worsen over next 5-7 days.      Relevant Medications   benzonatate (TESSALON) 100 MG capsule   Other Relevant Orders   Coronavirus (COVID-19) with Influenza A and Influenza B        No follow-ups on file.   I, Karmyn Lowman E Gracieann Stannard, PA-C, have reviewed all documentation for this visit. The documentation on 07/25/21 for the exam, diagnosis, procedures, and orders are all accurate and complete.   Ashayla Subia, Mirian Mo MPH [facility]    Leo Rod Perry as a Neurosurgeon for [employer], PA-C.,have documented all relevant documentation on the behalf of Ketina Mars E Britania Shreeve, PA-C,as directed by  [employer], PA-C while in the presence of Jassmin Kemmerer E Aevah Stansbery, PA-C.   Providence Crosby, PA-C  [employer] [phone] (phone) [phone] (fax)  [facility]

## 2021-07-25 NOTE — Patient Instructions (Addendum)
Based on your described symptoms and the duration of symptoms it is likely that you have a viral upper respiratory infection (often called a "cold")  Symptoms can last for 3-10 days with lingering cough and intermittent symptoms lasting weeks after that.  The goal of treatment at this time is to reduce your symptoms and discomfort   I have sent in Tessalon pearls for you to take twice per day to help with your cough  You can use over the counter medications such as Dayquil/Nyquil, AlkaSeltzer formulations, etc to provide further relief of symptoms according to the manufacturer's instructions  If preferred you can use Coricidin to manage your symptoms rather than those medications mentioned above.  I don not recommend taking Nyquil or anything similar at night time as you said this can make you dizzy getting out of bed Use the Tessalon pearls at night to help with coughing and the daytime formulation of the above medications to control your daytime symptoms.  Stay well hydrated, use pedialyte or gatorade to help with this Stay well rested to help with your recovery.   If your symptoms do not improve or become worse in the next 5-7 days please make an apt at the office so we can see you  Go to the ER if you begin to have more serious symptoms such as shortness of breath, trouble breathing, loss of consciousness, swelling around the eyes, high fever, severe lasting headaches, vision changes or neck pain/stiffness.   We will call you with the results of your COVID and flu testing  It was nice to meet you and I appreciate the opportunity to be involved in your care

## 2021-07-27 LAB — COVID-19, FLU A+B NAA
Influenza A, NAA: NOT DETECTED
Influenza B, NAA: NOT DETECTED
SARS-CoV-2, NAA: DETECTED — AB

## 2021-07-28 ENCOUNTER — Other Ambulatory Visit: Payer: Self-pay | Admitting: Family Medicine

## 2021-07-28 DIAGNOSIS — K219 Gastro-esophageal reflux disease without esophagitis: Secondary | ICD-10-CM

## 2021-07-30 ENCOUNTER — Other Ambulatory Visit: Payer: Self-pay

## 2021-07-30 ENCOUNTER — Telehealth: Payer: Self-pay

## 2021-07-30 NOTE — Telephone Encounter (Signed)
Patient was called and given results and per Denny Peon advised to contact pharmacy to be screenined for antiviral medication.

## 2021-07-30 NOTE — Telephone Encounter (Signed)
Copied from CRM [id number]. Topic: General - Other >> Jul 30, 2021  9:52 AM Herby Abraham C wrote: Reason for CRM: pt called in for assistance. Pt says that she recevied a call from [facility]. stating that she tested positive for covid. Pt says that she hasn't received a call from her PCP office. Pt would like further. Assistance.   CB: [phone]

## 2021-08-07 ENCOUNTER — Other Ambulatory Visit: Payer: Self-pay

## 2021-08-07 ENCOUNTER — Ambulatory Visit (INDEPENDENT_AMBULATORY_CARE_PROVIDER_SITE_OTHER): Payer: Medicare Other | Admitting: Family Medicine

## 2021-08-07 ENCOUNTER — Encounter: Payer: Medicare Other | Admitting: Family Medicine

## 2021-08-07 ENCOUNTER — Encounter: Payer: Self-pay | Admitting: Family Medicine

## 2021-08-07 VITALS — BP 155/81 | HR 88 | Temp 97.9°F | Resp 16 | Wt 171.2 lb

## 2021-08-07 DIAGNOSIS — G629 Polyneuropathy, unspecified: Secondary | ICD-10-CM

## 2021-08-07 DIAGNOSIS — I4891 Unspecified atrial fibrillation: Secondary | ICD-10-CM

## 2021-08-07 DIAGNOSIS — E7849 Other hyperlipidemia: Secondary | ICD-10-CM

## 2021-08-07 DIAGNOSIS — I10 Essential (primary) hypertension: Secondary | ICD-10-CM | POA: Diagnosis not present

## 2021-08-07 DIAGNOSIS — Z0000 Encounter for general adult medical examination without abnormal findings: Secondary | ICD-10-CM | POA: Diagnosis not present

## 2021-08-07 DIAGNOSIS — E66812 Obesity, class 2: Secondary | ICD-10-CM

## 2021-08-07 DIAGNOSIS — Z6835 Body mass index (BMI) 35.0-35.9, adult: Secondary | ICD-10-CM

## 2021-08-07 MED ORDER — AMLODIPINE-OLMESARTAN 5-40 MG PO TABS: 1.0000 | ORAL_TABLET | Freq: Every day | ORAL | 3 refills | Status: DC

## 2021-08-07 NOTE — Progress Notes (Signed)
Annual Wellness Visit     Patient: Eileen Walsh, Female    DOB: [birth year], 88 y.o.   MRN: [id number] Visit Date: 08/07/2021  Today's Provider: [name], MD   Chief Complaint  Patient presents with   Medicare Wellness   Subjective    Eileen Walsh is a 88 y.o. female who presents today for her Annual Wellness Visit. She reports consuming a general diet. The patient does not participate in regular exercise at present. She generally feels well. She reports sleeping fairly well. She does have additional problems to discuss today. Had fall 08-05-21 Patient continues to drive and thinks she is a good driver.  She has had no accidents. She is widowed and continue to live alone.  She feels fairly well.  There are no new issues about her. HPI    Medications: Outpatient Medications Prior to Visit  Medication Sig   clobetasol (TEMOVATE) 0.05 % external solution Apply 1 application topically 2 (two) times daily. Mix 1 bottle into 1 tub of cerave cream and use qd/bid aa rash on back, sides, buttocks until clear   meclizine (ANTIVERT) 25 MG tablet Take 1 tablet (25 mg total) by mouth 2 (two) times daily as needed for dizziness.   Multiple Vitamin (MULTIVITAMIN WITH MINERALS) TABS tablet Take 1 tablet by mouth daily.   naproxen sodium (ALEVE) 220 MG tablet Take 1 tablet (220 mg total) by mouth daily as needed. Home med.   omeprazole (PRILOSEC) 20 MG capsule TAKE 1 CAPSULE EVERY       MORNING   [DISCONTINUED] amLODipine-olmesartan (AZOR) 5-40 MG tablet Take 1 tablet by mouth daily.   [DISCONTINUED] benzonatate (TESSALON) 100 MG capsule Take 1 capsule (100 mg total) by mouth 2 (two) times daily as needed for cough.   No facility-administered medications prior to visit.    Allergies  Allergen Reactions   Aspirin Other (See Comments)   Sulfa Antibiotics Hives and Other (See Comments)    Patient Care Team: [name]., MD as PCP - General (Family  Medicine) Lamar Blinks, MD as Consulting Physician (Cardiology) Nevada Crane, MD as Consulting Physician (Ophthalmology)  Review of Systems  All other systems reviewed and are negative.       Objective    Vitals: BP (!) 155/81 (BP Location: Left Arm, Patient Position: Sitting, Cuff Size: Normal)    Pulse 88    Temp 97.9 F (36.6 C) (Temporal)    Resp 16    Wt 171 lb 3.2 oz (77.7 kg)    SpO2 96%    BMI 31.31 kg/m  BP Readings from Last 3 Encounters:  08/07/21 (!) 155/81  07/25/21 125/60  07/15/21 137/76   Wt Readings from Last 3 Encounters:  08/07/21 171 lb 3.2 oz (77.7 kg)  07/15/21 171 lb 6.4 oz (77.7 kg)  07/02/21 184 lb 11.9 oz (83.8 kg)       Physical Exam Vitals reviewed.  Constitutional:      Appearance: She is well-developed.  HENT:     Head: Normocephalic and atraumatic.     Right Ear: External ear normal.     Left Ear: External ear normal.  Eyes:     Conjunctiva/sclera: Conjunctivae normal.     Pupils: Pupils are equal, round, and reactive to light.  Cardiovascular:     Rate and Rhythm: Normal rate and regular rhythm.     Heart sounds: Normal heart sounds. No murmur heard. Pulmonary:     Effort:  Pulmonary effort is normal. No respiratory distress.     Breath sounds: Normal breath sounds. No wheezing.  Abdominal:     General: There is no distension.     Tenderness: There is no abdominal tenderness.  Musculoskeletal:        General: No tenderness.     Cervical back: Normal range of motion and neck supple.  Skin:    General: Skin is warm and dry.     Findings: No erythema.  Neurological:     Mental Status: She is alert and oriented to person, place, and time. Mental status is at baseline.  Psychiatric:        Mood and Affect: Mood normal.        Behavior: Behavior normal.        Thought Content: Thought content normal.     Most recent functional status assessment: In your present state of health, do you have any difficulty performing the  following activities: 07/15/2021  Hearing? N  Vision? Y  Difficulty concentrating or making decisions? N  Walking or climbing stairs? Y  Dressing or bathing? N  Doing errands, shopping? N  Some recent data might be hidden   Most recent fall risk assessment: Fall Risk  08/07/2021  Falls in the past year? 1  Number falls in past yr: 1  Injury with Fall? 1  Comment fall in yard 2 days ago 08-05-21 ... Rt arm bruising  Risk for fall due to : History of fall(s)  Follow up Falls evaluation completed    Most recent depression screenings: PHQ 2/9 Scores 07/15/2021 10/09/2019  PHQ - 2 Score 0 1  PHQ- 9 Score 0 -   Most recent cognitive screening: 6CIT Screen 06/16/2016  What Year? 0 points  What month? 0 points  What time? 0 points  Count back from 20 2 points  Months in reverse 0 points  Repeat phrase 2 points  Total Score 4   Most recent Audit-C alcohol use screening Alcohol Use Disorder Test (AUDIT) 07/15/2021  1. How often do you have a drink containing alcohol? 0  2. How many drinks containing alcohol do you have on a typical day when you are drinking? 0  3. How often do you have six or more drinks on one occasion? 0  AUDIT-C Score 0  Alcohol Brief Interventions/Follow-up -   A score of 3 or more in women, and 4 or more in men indicates increased risk for alcohol abuse, EXCEPT if all of the points are from question 1   No results found for any visits on 08/07/21.  Assessment & Plan     Annual wellness visit done today including the all of the following: Reviewed patient's Family Medical History Reviewed and updated list of patient's medical providers Assessment of cognitive impairment was done Assessed patient's functional ability Established a written schedule for health screening services Health Risk Assessent Completed and Reviewed  Exercise Activities and Dietary recommendations  Goals      Increase water intake     Starting 06/16/2016, I will try to drink 2 glasses  of water a day.     LIFESTYLE - DECREASE FALLS RISK     Recommend to remove any items from the home that may cause slips or trips.        Immunization History  Administered Date(s) Administered   Fluad Quad(high Dose 65+) 04/10/2020   Influenza-Unspecified 03/19/2017, 03/15/2018   PFIZER(Purple Top)SARS-COV-2 Vaccination 08/14/2019, 09/04/2019, 04/10/2020   Pneumococcal Conjugate-13 05/15/2014  Pneumococcal Polysaccharide-23 04/16/1997   Td 06/30/2004    Health Maintenance  Topic Date Due   FOOT EXAM  Never done   Zoster Vaccines- Shingrix (1 of 2) Never done   TETANUS/TDAP  06/30/2014   OPHTHALMOLOGY EXAM  05/15/2017   HEMOGLOBIN A1C  10/24/2017   COVID-19 Vaccine (4 - Booster for Pfizer series) 06/05/2020   INFLUENZA VACCINE  01/13/2021   Pneumonia Vaccine 69+ Years old  Completed   DEXA SCAN  Completed   HPV VACCINES  Aged Out     Discussed health benefits of physical activity, and encouraged her to engage in regular exercise appropriate for her age and condition.    1. Encounter for Medicare annual wellness exam   2. Essential hypertension Fairly good control. - amLODipine-olmesartan (AZOR) 5-40 MG tablet; Take 1 tablet by mouth daily.  Dispense: 90 tablet; Refill: 3  3. Other hyperlipidemia Patient refuses statin, states she is intolerant.  4. Class 2 severe obesity due to excess calories with serious comorbidity and body mass index (BMI) of 35.0 to 35.9 in adult (HCC)   5. Atrial fibrillation with RVR (HCC)  6.  Chronic neuropathy Clinically stable at this time. Return in about 6 months (around 02/04/2022).     I, [name], MD, have reviewed all documentation for this visit. The documentation on 08/11/21 for the exam, diagnosis, procedures, and orders are all accurate and complete.    Charlisha Market Wendelyn Breslow, MD  [facility] [phone] (phone) [phone] (fax)  [facility]

## 2021-10-30 ENCOUNTER — Telehealth: Payer: Self-pay | Admitting: Family Medicine

## 2021-10-30 NOTE — Telephone Encounter (Signed)
Roddie Mc w/ [facility] calling to let you know the pt's plan only allows for 90 omeprazole (PRILOSEC) 20 MG capsule every 365 days w/out a prior authorization.  For a prior auth, call: [phone] Reference :  [id number]   [facility] call back [phone]

## 2021-11-12 NOTE — Telephone Encounter (Signed)
Eileen w/ [facility] [employer] calling to follow up on prior auth request Please call: [phone] Use:  Eileen Walsh  Pt is out of medication and needs to be urgent

## 2021-11-12 NOTE — Telephone Encounter (Signed)
PA was started. 

## 2021-11-18 NOTE — Telephone Encounter (Signed)
PA was denied

## 2021-11-26 ENCOUNTER — Observation Stay
Admission: EM | Admit: 2021-11-26 | Discharge: 2021-11-27 | Disposition: A | Discharge status: Home / Self Care | Payer: Medicare Other | Attending: Internal Medicine | Admitting: Internal Medicine

## 2021-11-26 ENCOUNTER — Emergency Department: Payer: Medicare Other

## 2021-11-26 ENCOUNTER — Observation Stay: Payer: Medicare Other

## 2021-11-26 DIAGNOSIS — S80211A Abrasion, right knee, initial encounter: Secondary | ICD-10-CM | POA: Insufficient documentation

## 2021-11-26 DIAGNOSIS — S6000XA Contusion of unspecified finger without damage to nail, initial encounter: Secondary | ICD-10-CM | POA: Diagnosis not present

## 2021-11-26 DIAGNOSIS — Z79899 Other long term (current) drug therapy: Secondary | ICD-10-CM | POA: Insufficient documentation

## 2021-11-26 DIAGNOSIS — S065XAA Traumatic subdural hemorrhage with loss of consciousness status unknown, initial encounter: Secondary | ICD-10-CM | POA: Diagnosis not present

## 2021-11-26 DIAGNOSIS — W19XXXA Unspecified fall, initial encounter: Secondary | ICD-10-CM | POA: Diagnosis not present

## 2021-11-26 DIAGNOSIS — I480 Paroxysmal atrial fibrillation: Secondary | ICD-10-CM | POA: Diagnosis present

## 2021-11-26 DIAGNOSIS — I160 Hypertensive urgency: Secondary | ICD-10-CM | POA: Diagnosis not present

## 2021-11-26 DIAGNOSIS — I10 Essential (primary) hypertension: Secondary | ICD-10-CM | POA: Diagnosis not present

## 2021-11-26 DIAGNOSIS — R55 Syncope and collapse: Secondary | ICD-10-CM | POA: Diagnosis present

## 2021-11-26 DIAGNOSIS — S065X0A Traumatic subdural hemorrhage without loss of consciousness, initial encounter: Principal | ICD-10-CM | POA: Insufficient documentation

## 2021-11-26 DIAGNOSIS — E785 Hyperlipidemia, unspecified: Secondary | ICD-10-CM | POA: Diagnosis present

## 2021-11-26 DIAGNOSIS — S0181XA Laceration without foreign body of other part of head, initial encounter: Secondary | ICD-10-CM | POA: Diagnosis not present

## 2021-11-26 DIAGNOSIS — Y92009 Unspecified place in unspecified non-institutional (private) residence as the place of occurrence of the external cause: Secondary | ICD-10-CM | POA: Diagnosis not present

## 2021-11-26 DIAGNOSIS — I2510 Atherosclerotic heart disease of native coronary artery without angina pectoris: Secondary | ICD-10-CM | POA: Diagnosis not present

## 2021-11-26 DIAGNOSIS — Z23 Encounter for immunization: Secondary | ICD-10-CM | POA: Diagnosis not present

## 2021-11-26 LAB — TROPONIN I (HIGH SENSITIVITY)
Troponin I (High Sensitivity): 5 ng/L (ref <18)
Troponin I (High Sensitivity): 6 ng/L (ref <18)

## 2021-11-26 LAB — COMPREHENSIVE METABOLIC PANEL
ALT: 15 U/L (ref 0–44)
AST: 33 U/L (ref 15–41)
Albumin: 3.7 g/dL (ref 3.5–5.0)
Alkaline Phosphatase: 58 U/L (ref 38–126)
Anion gap: 7 (ref 5–15)
BUN: 23 mg/dL (ref 8–23)
CO2: 23 mmol/L (ref 22–32)
Calcium: 9.3 mg/dL (ref 8.9–10.3)
Chloride: 105 mmol/L (ref 98–111)
Creatinine, Ser: 0.84 mg/dL (ref 0.44–1.00)
GFR, Estimated: >60 mL/min (ref >60)
Glucose, Bld: 104 mg/dL — ABNORMAL HIGH (ref 70–99)
Potassium: 4.6 mmol/L (ref 3.5–5.1)
Sodium: 135 mmol/L (ref 135–145)
Total Bilirubin: 1.1 mg/dL (ref 0.3–1.2)
Total Protein: 7 g/dL (ref 6.5–8.1)

## 2021-11-26 LAB — CBC WITH DIFFERENTIAL/PLATELET
Abs Immature Granulocytes: 0.04 10*3/uL (ref 0.00–0.07)
Basophils Absolute: 0.1 10*3/uL (ref 0.0–0.1)
Basophils Relative: 1 %
Eosinophils Absolute: 0.4 10*3/uL (ref 0.0–0.5)
Eosinophils Relative: 4 %
HCT: 43.5 % (ref 36.0–46.0)
Hemoglobin: 14.5 g/dL (ref 12.0–15.0)
Immature Granulocytes: 1 %
Lymphocytes Relative: 25 %
Lymphs Abs: 2.2 10*3/uL (ref 0.7–4.0)
MCH: 30.5 pg (ref 26.0–34.0)
MCHC: 33.3 g/dL (ref 30.0–36.0)
MCV: 91.6 fL (ref 80.0–100.0)
Monocytes Absolute: 0.7 10*3/uL (ref 0.1–1.0)
Monocytes Relative: 8 %
Neutro Abs: 5.2 10*3/uL (ref 1.7–7.7)
Neutrophils Relative %: 61 %
Platelets: 242 10*3/uL (ref 150–400)
RBC: 4.75 MIL/uL (ref 3.87–5.11)
RDW: 12.4 % (ref 11.5–15.5)
WBC: 8.6 10*3/uL (ref 4.0–10.5)
nRBC: 0 % (ref 0.0–0.2)

## 2021-11-26 LAB — URINALYSIS, COMPLETE (UACMP) WITH MICROSCOPIC
Bacteria, UA: NONE SEEN
Bilirubin Urine: NEGATIVE
Glucose, UA: NEGATIVE mg/dL
Hgb urine dipstick: NEGATIVE
Ketones, ur: NEGATIVE mg/dL
Leukocytes,Ua: NEGATIVE
Nitrite: NEGATIVE
Protein, ur: NEGATIVE mg/dL
Specific Gravity, Urine: 1.012 (ref 1.005–1.030)
pH: 5 (ref 5.0–8.0)

## 2021-11-26 LAB — TYPE AND SCREEN
ABO/RH(D): O POS
Antibody Screen: NEGATIVE

## 2021-11-26 LAB — PROTIME-INR
INR: 1 (ref 0.8–1.2)
Prothrombin Time: 13.2 seconds (ref 11.4–15.2)

## 2021-11-26 LAB — APTT: aPTT: 29 seconds (ref 24–36)

## 2021-11-26 MED ORDER — LABETALOL HCL 5 MG/ML IV SOLN
10.0000 mg | Freq: Once | INTRAVENOUS | Status: DC
  Filled 2021-11-26: qty 4

## 2021-11-26 MED ORDER — AMLODIPINE BESYLATE 5 MG PO TABS
5.0000 mg | ORAL_TABLET | Freq: Every day | ORAL | Status: DC
  Administered 2021-11-27: 5 mg via ORAL
  Filled 2021-11-26 (×2): qty 1

## 2021-11-26 MED ORDER — HYDRALAZINE HCL 20 MG/ML IJ SOLN
5.0000 mg | INTRAMUSCULAR | Status: DC | PRN
Start: 2021-11-26 — End: 2021-11-27

## 2021-11-26 MED ORDER — LACTATED RINGERS IV SOLN: INTRAVENOUS | Status: AC

## 2021-11-26 MED ORDER — PANTOPRAZOLE SODIUM 40 MG PO TBEC
40.0000 mg | DELAYED_RELEASE_TABLET | Freq: Every day | ORAL | Status: DC
  Administered 2021-11-27: 40 mg via ORAL
  Filled 2021-11-26: qty 1

## 2021-11-26 MED ORDER — AMLODIPINE-OLMESARTAN 5-40 MG PO TABS
1.0000 | ORAL_TABLET | Freq: Every day | ORAL | Status: DC
Start: 2021-11-27 — End: 2021-11-26

## 2021-11-26 MED ORDER — MECLIZINE HCL 25 MG PO TABS: 25.0000 mg | ORAL_TABLET | Freq: Two times a day (BID) | ORAL | Status: DC | PRN

## 2021-11-26 MED ORDER — TETANUS-DIPHTH-ACELL PERTUSSIS 5-2.5-18.5 LF-MCG/0.5 IM SUSY
0.5000 mL | PREFILLED_SYRINGE | Freq: Once | INTRAMUSCULAR | Status: AC
  Administered 2021-11-26: 0.5 mL via INTRAMUSCULAR
  Filled 2021-11-26: qty 0.5

## 2021-11-26 MED ORDER — ACETAMINOPHEN 325 MG PO TABS: 650.0000 mg | ORAL_TABLET | Freq: Four times a day (QID) | ORAL | Status: DC | PRN

## 2021-11-26 MED ORDER — ADULT MULTIVITAMIN W/MINERALS CH
1.0000 | ORAL_TABLET | Freq: Every day | ORAL | Status: DC
Start: 2021-11-27 — End: 2021-11-27
  Administered 2021-11-27: 1 via ORAL
  Filled 2021-11-26: qty 1

## 2021-11-26 MED ORDER — ACETAMINOPHEN 500 MG PO TABS
1000.0000 mg | ORAL_TABLET | Freq: Once | ORAL | Status: AC
  Administered 2021-11-26: 1000 mg via ORAL
  Filled 2021-11-26: qty 2

## 2021-11-26 MED ORDER — ONDANSETRON HCL 4 MG/2ML IJ SOLN: 4.0000 mg | Freq: Three times a day (TID) | INTRAMUSCULAR | Status: DC | PRN

## 2021-11-26 MED ORDER — IRBESARTAN 150 MG PO TABS
300.0000 mg | ORAL_TABLET | Freq: Every day | ORAL | Status: DC
  Administered 2021-11-27: 300 mg via ORAL
  Filled 2021-11-26 (×2): qty 2

## 2021-11-26 NOTE — Assessment & Plan Note (Signed)
CT scan showed small subdural hematoma, no mass effect.  Patient is asymptomatic.  Consulted Dr. Marcell Barlow of neurosurgery, who recommended to repeat CT scan in 6 hours  -Placed on PCU for observation -Repeat CT scan of head at 6:45 PM -Frequent neurochecks

## 2021-11-26 NOTE — Assessment & Plan Note (Signed)
See abvoe

## 2021-11-26 NOTE — H&P (Signed)
History and Physical    [name]:[id number] DOB: Sep 13, 1928 DOA: 11/26/2021  Referring MD/NP/PA:   PCP: [name]., MD   Patient coming from:  The patient is coming from home.  At baseline, pt is independent for most of ADL.        Chief Complaint: syncope  HPI: Eileen Walsh is a 88 y.o. female with medical history significant of PAF not on anticoagulants, hypertension, hyperlipidemia, GERD, CAD, macular degeneration, who presents with syncope.  Per her daughter at the bedside, pt passed out at about 10:00 AM. Pt states that she was pulling a weed at her mailbox, lost her balance and passed out, falling forward and hitting her head.  She attributed this to her chronic bilateral peripheral neuropathy.  She denies prodromal symptoms.  No unilateral numbness or tinglings in extremities, no facial droop or slurred speech. No vision loss or hearing loss.  Denies dizziness or lightheadedness.  Denies any headache. She sustained a small laceration to her chin and small skin abrasion in the right knee.  Denies chest pain, cough, shortness breath.  No nausea vomiting, diarrhea or abdominal pain.  No symptoms of UTI.  Of note, patient initially had elevated blood pressure 260/87, which improved to 153/61 after giving 10 mg of labetalol in the ED.  Data Reviewed and ED Course: pt was found to have WBC 8.6, INR 1.0, PTT 29, troponin level 5, 6, GFR > 60, temperature normal, heart rate 88, 68, RR 18, oxygen saturation 99% on room air.  Chest x-ray negative.  X-ray of right hand and right knee negative for acute injury.  CT of C-spine negative for acute injury.  CT of maxillofacial image is negative for acute injury for facial bone.  CT scan of head showed a small left subdural hematoma without mass effect. Pt is placed in PCU for observation.  Dr. Myer Haff of neurosurgery is consulted.  CT of head, C spin and maxillofacial image: 1. Small left subdural hemorrhage overlying the  left temporal lobe and insula, measuring up to 0.4 cm in maximum coronal thickness. No evidence of midline shift or mass effect. No overlying skull fracture. 2. Advanced small-vessel white matter disease. 3. No displaced fractures or dislocations of the facial bones. Soft tissue contusion of the right cheek and chin. 4. No fracture or static subluxation of the cervical spine. 5. Osteopenia and multilevel degenerative disc disease of the cervical spine.   EKG: I have personally reviewed.  Sinus rhythm, QTc 406, poor R wave progression, no ischemic change   Review of Systems:   General: no fevers, chills, no body weight gain, fatigue HEENT: no blurry vision, hearing changes or sore throat Respiratory: no dyspnea, coughing, wheezing CV: no chest pain, no palpitations GI: no nausea, vomiting, abdominal pain, diarrhea, constipation GU: no dysuria, burning on urination, increased urinary frequency, hematuria  Ext: no leg edema Neuro: no unilateral weakness, numbness, or tingling, no vision change or hearing loss. Syncope and fall Skin: has a small laceration to her chin and a small skin abrasion in the right knee MSK: No muscle spasm, no deformity, no limitation of range of movement in spin Heme: No easy bruising.  Travel history: No recent long distant travel.   Allergy:  Allergies  Allergen Reactions   Aspirin Other (See Comments)   Sulfa Antibiotics Hives and Other (See Comments)    Past Medical History:  Diagnosis Date   Arthritis    Fibromyalgia    Hypertension  Macular degeneration     Past Surgical History:  Procedure Laterality Date   ABDOMINAL HYSTERECTOMY  1970   BREAST SURGERY  1985   Breast Bx   CHOLECYSTECTOMY      Social History:  reports that she has never smoked. She has never used smokeless tobacco. She reports that she does not currently use alcohol. She reports that she does not use drugs.  Family History:  Family History  Problem Relation Age  of Onset   Hypertension Mother    Arthritis Mother    Hyperlipidemia Mother    Heart disease Father    Hypertension Brother    Stroke Brother    Diabetes Brother    Arthritis Brother    Heart disease Brother    Anxiety disorder Brother        severe. had nervous breakdown     Prior to Admission medications   Medication Sig Start Date End Date Taking? Authorizing Provider  amLODipine-olmesartan (AZOR) 5-40 MG tablet Take 1 tablet by mouth daily. 08/07/21   [name]., MD  clobetasol (TEMOVATE) 0.05 % external solution Apply 1 application topically 2 (two) times daily. Mix 1 bottle into 1 tub of cerave cream and use qd/bid aa rash on back, sides, buttocks until clear 05/06/21   Willeen Niece, MD  meclizine (ANTIVERT) 25 MG tablet Take 1 tablet (25 mg total) by mouth 2 (two) times daily as needed for dizziness. 07/04/21   Darlin Priestly, MD  Multiple Vitamin (MULTIVITAMIN WITH MINERALS) TABS tablet Take 1 tablet by mouth daily.    [provider]  naproxen sodium (ALEVE) 220 MG tablet Take 1 tablet (220 mg total) by mouth daily as needed. Home med. 07/04/21   Darlin Priestly, MD  omeprazole (PRILOSEC) 20 MG capsule TAKE 1 CAPSULE EVERY       MORNING 07/28/21   [name]., MD    Physical Exam: Vitals:   11/26/21 1136 11/26/21 1200 11/26/21 1300  BP: (!) 206/87 (!) 196/85 (!) 153/61  Pulse: 88 83 68  Resp: 18  14  Temp: 98.2 F (36.8 C)    TempSrc: Oral    SpO2: 99% 97% 98%   General: Not in acute distress HEENT:       Eyes: PERRL, EOMI, no scleral icterus.       ENT: No discharge from the ears and nose, no pharynx injection, no tonsillar enlargement.        Neck: No JVD, no bruit, no mass felt. Heme: No neck lymph node enlargement. Cardiac: S1/S2, RRR, No murmurs, No gallops or rubs. Respiratory: No rales, wheezing, rhonchi or rubs. GI: Soft, nondistended, nontender, no rebound pain, no organomegaly, BS present. GU: No hematuria Ext: No pitting leg edema  bilaterally. 1+DP/PT pulse bilaterally. Musculoskeletal: No joint deformities, No joint redness or warmth, no limitation of ROM in spin. Skin:  has a small laceration to her chin and a small skin abrasion in the right knee Neuro: Alert, oriented X3, cranial nerves II-XII grossly intact, moves all extremities normally. Muscle strength 5/5 in all extremities, sensation to light touch intact. B Psych: Patient is not psychotic, no suicidal or hemocidal ideation.  Labs on Admission: I have personally reviewed following labs and imaging studies  CBC: Recent Labs  Lab 11/26/21 1220  WBC 8.6  NEUTROABS 5.2  HGB 14.5  HCT 43.5  MCV 91.6  PLT 242   Basic Metabolic Panel: Recent Labs  Lab 11/26/21 1220  NA 135  K 4.6  CL 105  CO2 23  GLUCOSE 104*  BUN 23  CREATININE 0.84  CALCIUM 9.3   GFR: CrCl cannot be calculated (Unknown ideal weight.). Liver Function Tests: Recent Labs  Lab 11/26/21 1220  AST 33  ALT 15  ALKPHOS 58  BILITOT 1.1  PROT 7.0  ALBUMIN 3.7   No results for input(s): "LIPASE", "AMYLASE" in the last 168 hours. No results for input(s): "AMMONIA" in the last 168 hours. Coagulation Profile: Recent Labs  Lab 11/26/21 1328  INR 1.0   Cardiac Enzymes: No results for input(s): "CKTOTAL", "CKMB", "CKMBINDEX", "TROPONINI" in the last 168 hours. BNP (last 3 results) No results for input(s): "PROBNP" in the last 8760 hours. HbA1C: No results for input(s): "HGBA1C" in the last 72 hours. CBG: No results for input(s): "GLUCAP" in the last 168 hours. Lipid Profile: No results for input(s): "CHOL", "HDL", "LDLCALC", "TRIG", "CHOLHDL", "LDLDIRECT" in the last 72 hours. Thyroid Function Tests: No results for input(s): "TSH", "T4TOTAL", "FREET4", "T3FREE", "THYROIDAB" in the last 72 hours. Anemia Panel: No results for input(s): "VITAMINB12", "FOLATE", "FERRITIN", "TIBC", "IRON", "RETICCTPCT" in the last 72 hours. Urine analysis:    Component Value Date/Time    COLORURINE YELLOW (A) 11/26/2021 1420   APPEARANCEUR CLEAR (A) 11/26/2021 1420   LABSPEC 1.012 11/26/2021 1420   PHURINE 5.0 11/26/2021 1420   GLUCOSEU NEGATIVE 11/26/2021 1420   HGBUR NEGATIVE 11/26/2021 1420   BILIRUBINUR NEGATIVE 11/26/2021 1420   KETONESUR NEGATIVE 11/26/2021 1420   PROTEINUR NEGATIVE 11/26/2021 1420   NITRITE NEGATIVE 11/26/2021 1420   LEUKOCYTESUR NEGATIVE 11/26/2021 1420   Sepsis Labs: @LABRCNTIP (procalcitonin:4,lacticidven:4) )No results found for this or any previous visit (from the past 240 hour(s)).   Radiological Exams on Admission: DG Knee Complete 4 Views Right  Result Date: 11/26/2021 CLINICAL DATA:  Fall EXAM: RIGHT KNEE - COMPLETE 4+ VIEW COMPARISON:  None Available. FINDINGS: Alignment is anatomic. No acute fracture. No joint effusion. Lateral joint space narrowing. Atherosclerotic calcification. IMPRESSION: No acute fracture. Electronically Signed   By: 11/28/2021 M.D.   On: 11/26/2021 13:02   DG Hand Complete Right  Result Date: 11/26/2021 CLINICAL DATA:  fall EXAM: RIGHT HAND - COMPLETE 3+ VIEW COMPARISON:  None Available. FINDINGS: No evidence of acute fracture or joint malalignment. Scattered degenerative change, greatest involving the DIP joints and triscaphe joint. Vascular calcifications. IMPRESSION: No evidence of acute fracture or joint malalignment. Electronically Signed   By: 11/28/2021 M.D.   On: 11/26/2021 13:01   DG Chest Portable 1 View  Result Date: 11/26/2021 CLINICAL DATA:  A 88 year old female presents for evaluation of pain after fall on RIGHT side. EXAM: PORTABLE CHEST 1 VIEW COMPARISON:  July 15, 2016. FINDINGS: The heart size and mediastinal contours are within normal limits. Both lungs are clear. The visualized skeletal structures are unremarkable. IMPRESSION: No active disease. Electronically Signed   By: July 17, 2016 M.D.   On: 11/26/2021 13:01   CT HEAD WO CONTRAST (11/28/2021)  Result Date: 11/26/2021 CLINICAL  DATA:  Fall EXAM: CT HEAD WITHOUT CONTRAST CT MAXILLOFACIAL WITHOUT CONTRAST CT CERVICAL SPINE WITHOUT CONTRAST TECHNIQUE: Multidetector CT imaging of the head, cervical spine, and maxillofacial structures were performed using the standard protocol without intravenous contrast. Multiplanar CT image reconstructions of the cervical spine and maxillofacial structures were also generated. RADIATION DOSE REDUCTION: This exam was performed according to the departmental dose-optimization program which includes automated exposure control, adjustment of the mA and/or kV according to patient size and/or use of iterative reconstruction technique. COMPARISON:  07/15/2016 FINDINGS: CT HEAD FINDINGS Brain: Small left subdural hemorrhage overlying the temporal lobe and insula, measuring up to 0.4 cm in maximum coronal thickness (series 4, image 26). No evidence of midline shift or mass effect. Extensive periventricular and deep white matter hypodensity. No evidence of acute infarction, hydrocephalus, or mass lesion/mass effect. Vascular: No hyperdense vessel or unexpected calcification. CT FACIAL BONES FINDINGS Skull: Normal. Negative for fracture or focal lesion. Facial bones: No displaced fractures or dislocations. Sinuses/Orbits: No acute finding. Other: Soft tissue contusion of the right cheek (series 2, image 54) and chin (series 2, image 79). CT CERVICAL SPINE FINDINGS Alignment: Degenerative straightening of the normal cervical lordosis. Skull base and vertebrae: Osteopenia. No acute fracture. No primary bone lesion or focal pathologic process. Soft tissues and spinal canal: No prevertebral fluid or swelling. No visible canal hematoma. Disc levels: Generally mild multilevel disc space height loss and osteophytosis throughout the cervical spine, focally moderate at C6-C7. Upper chest: Negative. Other: None. IMPRESSION: 1. Small left subdural hemorrhage overlying the left temporal lobe and insula, measuring up to 0.4 cm in  maximum coronal thickness. No evidence of midline shift or mass effect. No overlying skull fracture. 2. Advanced small-vessel white matter disease. 3. No displaced fractures or dislocations of the facial bones. Soft tissue contusion of the right cheek and chin. 4. No fracture or static subluxation of the cervical spine. 5. Osteopenia and multilevel degenerative disc disease of the cervical spine. These results were called by telephone at the time of interpretation on 11/26/2021 at 12:37 pm to Dr. Antoine Primas , who verbally acknowledged these results. Electronically Signed   By: Jearld Lesch M.D.   On: 11/26/2021 12:42   CT Maxillofacial Wo Contrast  Result Date: 11/26/2021 CLINICAL DATA:  Fall EXAM: CT HEAD WITHOUT CONTRAST CT MAXILLOFACIAL WITHOUT CONTRAST CT CERVICAL SPINE WITHOUT CONTRAST TECHNIQUE: Multidetector CT imaging of the head, cervical spine, and maxillofacial structures were performed using the standard protocol without intravenous contrast. Multiplanar CT image reconstructions of the cervical spine and maxillofacial structures were also generated. RADIATION DOSE REDUCTION: This exam was performed according to the departmental dose-optimization program which includes automated exposure control, adjustment of the mA and/or kV according to patient size and/or use of iterative reconstruction technique. COMPARISON:  07/15/2016 FINDINGS: CT HEAD FINDINGS Brain: Small left subdural hemorrhage overlying the temporal lobe and insula, measuring up to 0.4 cm in maximum coronal thickness (series 4, image 26). No evidence of midline shift or mass effect. Extensive periventricular and deep white matter hypodensity. No evidence of acute infarction, hydrocephalus, or mass lesion/mass effect. Vascular: No hyperdense vessel or unexpected calcification. CT FACIAL BONES FINDINGS Skull: Normal. Negative for fracture or focal lesion. Facial bones: No displaced fractures or dislocations. Sinuses/Orbits: No acute  finding. Other: Soft tissue contusion of the right cheek (series 2, image 54) and chin (series 2, image 79). CT CERVICAL SPINE FINDINGS Alignment: Degenerative straightening of the normal cervical lordosis. Skull base and vertebrae: Osteopenia. No acute fracture. No primary bone lesion or focal pathologic process. Soft tissues and spinal canal: No prevertebral fluid or swelling. No visible canal hematoma. Disc levels: Generally mild multilevel disc space height loss and osteophytosis throughout the cervical spine, focally moderate at C6-C7. Upper chest: Negative. Other: None. IMPRESSION: 1. Small left subdural hemorrhage overlying the left temporal lobe and insula, measuring up to 0.4 cm in maximum coronal thickness. No evidence of midline shift or mass effect. No overlying skull fracture. 2. Advanced small-vessel white matter disease. 3. No displaced  fractures or dislocations of the facial bones. Soft tissue contusion of the right cheek and chin. 4. No fracture or static subluxation of the cervical spine. 5. Osteopenia and multilevel degenerative disc disease of the cervical spine. These results were called by telephone at the time of interpretation on 11/26/2021 at 12:37 pm to Dr. Antoine Primas , who verbally acknowledged these results. Electronically Signed   By: Jearld Lesch M.D.   On: 11/26/2021 12:42   CT Cervical Spine Wo Contrast  Result Date: 11/26/2021 CLINICAL DATA:  Fall EXAM: CT HEAD WITHOUT CONTRAST CT MAXILLOFACIAL WITHOUT CONTRAST CT CERVICAL SPINE WITHOUT CONTRAST TECHNIQUE: Multidetector CT imaging of the head, cervical spine, and maxillofacial structures were performed using the standard protocol without intravenous contrast. Multiplanar CT image reconstructions of the cervical spine and maxillofacial structures were also generated. RADIATION DOSE REDUCTION: This exam was performed according to the departmental dose-optimization program which includes automated exposure control, adjustment of  the mA and/or kV according to patient size and/or use of iterative reconstruction technique. COMPARISON:  07/15/2016 FINDINGS: CT HEAD FINDINGS Brain: Small left subdural hemorrhage overlying the temporal lobe and insula, measuring up to 0.4 cm in maximum coronal thickness (series 4, image 26). No evidence of midline shift or mass effect. Extensive periventricular and deep white matter hypodensity. No evidence of acute infarction, hydrocephalus, or mass lesion/mass effect. Vascular: No hyperdense vessel or unexpected calcification. CT FACIAL BONES FINDINGS Skull: Normal. Negative for fracture or focal lesion. Facial bones: No displaced fractures or dislocations. Sinuses/Orbits: No acute finding. Other: Soft tissue contusion of the right cheek (series 2, image 54) and chin (series 2, image 79). CT CERVICAL SPINE FINDINGS Alignment: Degenerative straightening of the normal cervical lordosis. Skull base and vertebrae: Osteopenia. No acute fracture. No primary bone lesion or focal pathologic process. Soft tissues and spinal canal: No prevertebral fluid or swelling. No visible canal hematoma. Disc levels: Generally mild multilevel disc space height loss and osteophytosis throughout the cervical spine, focally moderate at C6-C7. Upper chest: Negative. Other: None. IMPRESSION: 1. Small left subdural hemorrhage overlying the left temporal lobe and insula, measuring up to 0.4 cm in maximum coronal thickness. No evidence of midline shift or mass effect. No overlying skull fracture. 2. Advanced small-vessel white matter disease. 3. No displaced fractures or dislocations of the facial bones. Soft tissue contusion of the right cheek and chin. 4. No fracture or static subluxation of the cervical spine. 5. Osteopenia and multilevel degenerative disc disease of the cervical spine. These results were called by telephone at the time of interpretation on 11/26/2021 at 12:37 pm to Dr. Antoine Primas , who verbally acknowledged these  results. Electronically Signed   By: Jearld Lesch M.D.   On: 11/26/2021 12:42      Assessment/Plan Principal Problem:   SDH (subdural hematoma) (HCC) Active Problems:   Syncope   Hypertensive urgency   HTN (hypertension)   HLD (hyperlipidemia)   CAD (coronary artery disease)   AF (paroxysmal atrial fibrillation) (HCC)   Principal Problem:   SDH (subdural hematoma) (HCC) Active Problems:   Syncope   Hypertensive urgency   HTN (hypertension)   HLD (hyperlipidemia)   CAD (coronary artery disease)   AF (paroxysmal atrial fibrillation) (HCC)   Assessment and Plan: * SDH (subdural hematoma) (HCC) CT scan showed small subdural hematoma, no mass effect.  Patient is asymptomatic.  Consulted Dr. Marcell Barlow of neurosurgery, who recommended to repeat CT scan in 6 hours  -Placed on PCU for observation -Repeat CT scan of head  at 6:45 PM -Frequent neurochecks    Syncope Etiology is not clear, possibly due to vasovagal syncope.  No focal neurodeficit on physical examination.  Low suspicions for stroke.  Patient has elevated blood pressure at 206/87 and usually. -Will get 2D echo -Frequent neurochecks -Check orthostatic vital signs -IV fluid: LR 75 cc/h  Hypertensive urgency Blood pressure 206/87, 153/61 after giving pain milligram of labetalol -IV hydralazine as needed for SBP> 160 -Azor  HTN (hypertension) See abvoe  HLD (hyperlipidemia) - Not taking medications currently -Follow-up with PCP  CAD (coronary artery disease) No chest pain -Blood pressure control as above  AF (paroxysmal atrial fibrillation) (HCC) - Heart rate 88, 68.  Patient's not taking anticoagulants currently. -Telemetry monitoring             DVT ppx: SCD  Code Status: Full code  Family Communication:  Yes, patient's daughter   at bed side.        Disposition Plan:  Anticipate discharge back to previous environment  Consults called: Dr. [place]cell [place] for neurosurgery  Admission  status and Level of care: Progressive:    for obs    Severity of Illness:  The appropriate patient status for this patient is OBSERVATION. Observation status is judged to be reasonable and necessary in order to provide the required intensity of service to ensure the patient's safety. The patient's presenting symptoms, physical exam findings, and initial radiographic and laboratory data in the context of their medical condition is felt to place them at decreased risk for further clinical deterioration. Furthermore, it is anticipated that the patient will be medically stable for discharge from the hospital within 2 midnights of admission.        Date of Service 11/26/2021    Lorretta HarpXilin Rosary Filosa [facility]   If 7PM-7AM, please contact night-coverage [email or web address] 11/26/2021, 6:27 PM

## 2021-11-26 NOTE — Assessment & Plan Note (Signed)
No chest pain -Blood pressure control as above

## 2021-11-26 NOTE — Consult Note (Signed)
[facility] Nurse wound consult note Consultation was completed by review of records, images and assistance from the bedside nurse/clinical staff.   Reason for Consult:chin laceration from fall Wound type: trauma Pressure Injury POA: NA Wound bed: see ED MD assessment  Dressing procedure/placement/frequency: nonadherent dressing placed by ED MD agree with that care, updated orders for Vaseline gauze or xeroform gauze and foam if admitted. OK for OTC antibiotic ointment and bandaid at home.  Patient declined repair with dermabond from ED MD.   Notified hospitalist of same.  [place], [place], [place]

## 2021-11-26 NOTE — Assessment & Plan Note (Signed)
-  Not taking medications currently °-Follow-up with PCP °

## 2021-11-26 NOTE — Assessment & Plan Note (Signed)
Blood pressure 206/87, 153/61 after giving pain milligram of labetalol -IV hydralazine as needed for SBP> 160 -Azor

## 2021-11-26 NOTE — Consult Note (Signed)
Neurosurgery-New Consultation Evaluation 11/26/2021 SURAH FABBRO BP:[id number]  Identifying Statement: Eileen Walsh is a 88 y.o. female from [place] [id number] with a history of HTN, obesity, hyperlipidemia, A-fib, and chronic peripheral neuropathy.  Physician Requesting Consultation: No ref. provider found  History of Present Illness: Eileen Walsh is a very pleasant 88 year old presenting to the ER after a fall with positive LOC.  She states that she was pulling a weed at her mailbox and lost her balance falling forward and hitting her head.  She does not recall feeling dizzy or lightheaded but was told by EMS that she did variance loss of consciousness.  She sustained a abrasion to her chin and left cheek and endorses pain in her right hand and knee.  She denies any severe headaches, nausea, or confusion.  Past Medical History:  Past Medical History:  Diagnosis Date   Arthritis    Fibromyalgia    Hypertension    Macular degeneration     Social History: Social History   Socioeconomic History   Marital status: Widowed    Spouse name: Not on file   Number of children: 3   Years of education: Not on file   Highest education level: High school graduate  Occupational History   Occupation: retired  Tobacco Use   Smoking status: Never   Smokeless tobacco: Never  Vaping Use   Vaping Use: Never used  Substance and Sexual Activity   Alcohol use: Not Currently    Alcohol/week: 0.0 standard drinks of alcohol   Drug use: No   Sexual activity: Not on file  Other Topics Concern   Not on file  Social History Narrative   Not on file   Social Determinants of Health   Financial Resource Strain: Not on file  Food Insecurity: Not on file  Transportation Needs: Not on file  Physical Activity: Inactive (10/09/2019)   Exercise Vital Sign    Days of Exercise per Week: 0 days    Minutes of Exercise per Session: 0 min  Stress: Not on file  Social Connections: Moderately  Isolated (10/09/2019)   Social Connection and Isolation Panel [NHANES]    Frequency of Communication with Friends and Family: More than three times a week    Frequency of Social Gatherings with Friends and Family: Three times a week    Attends Religious Services: More than 4 times per year    Active Member of Clubs or Organizations: No    Attends Archivist Meetings: Never    Marital Status: Widowed  Human resources officer Violence: Not on file   Living arrangements (living alone, with partner): pt lives alone   Family History: Family History  Problem Relation Age of Onset   Hypertension Mother    Arthritis Mother    Hyperlipidemia Mother    Heart disease Father    Hypertension Brother    Stroke Brother    Diabetes Brother    Arthritis Brother    Heart disease Brother    Anxiety disorder Brother        severe. had nervous breakdown    Review of Systems:  Review of Systems - General ROS: Negative Psychological ROS: Negative Ophthalmic ROS: Negative ENT ROS: Negative Hematological and Lymphatic ROS: Negative  Endocrine ROS: Negative Respiratory ROS: Negative Cardiovascular ROS: Negative Gastrointestinal ROS: Negative Genito-Urinary ROS: Negative Musculoskeletal ROS: Negative Neurological ROS: Negative Dermatological ROS: Negative  Physical Exam: BP (!) 153/61   Pulse 68   Temp 98.2 F (36.8 C) (Oral)  Resp 14   SpO2 98%  There is no height or weight on file to calculate BMI. There is no height or weight on file to calculate BSA. General appearance: Alert, cooperative, in no acute distress Head: Normocephalic, atraumatic Eyes: Normal, EOM intact Oropharynx: Moist without lesions Neck: Supple, no tenderness Heart: Normal, regular rate and rhythm Lungs:  good air exchange Abdomen: Soft, nondistended Ext: No edema in LE bilaterally, good distal pulses  Neurologic exam:  Mental status: alertness: alert, orientation: person, place, time, affect:  normal Speech: fluent and clear Cranial nerves:  CN II-XII grossly intact  Motor:strength symmetric 5/5, normal muscle mass and tone in all extremities and no pronator drift Sensory: intact to light touch in all extremities Reflexes: 2+ and symmetric bilaterally for arms and legs Coordination: intact finger to nose Gait: Ambulates with a slowed and mildly unsteady gait with the assistance of a cane.  Laboratory: Results for orders placed or performed during the hospital encounter of 11/26/21  CBC with Differential  Result Value Ref Range   WBC 8.6 4.0 - 10.5 K/uL   RBC 4.75 3.87 - 5.11 MIL/uL   Hemoglobin 14.5 12.0 - 15.0 g/dL   HCT 43.5 36.0 - 46.0 %   MCV 91.6 80.0 - 100.0 fL   MCH 30.5 26.0 - 34.0 pg   MCHC 33.3 30.0 - 36.0 g/dL   RDW 12.4 11.5 - 15.5 %   Platelets 242 150 - 400 K/uL   nRBC 0.0 0.0 - 0.2 %   Neutrophils Relative % 61 %   Neutro Abs 5.2 1.7 - 7.7 K/uL   Lymphocytes Relative 25 %   Lymphs Abs 2.2 0.7 - 4.0 K/uL   Monocytes Relative 8 %   Monocytes Absolute 0.7 0.1 - 1.0 K/uL   Eosinophils Relative 4 %   Eosinophils Absolute 0.4 0.0 - 0.5 K/uL   Basophils Relative 1 %   Basophils Absolute 0.1 0.0 - 0.1 K/uL   Immature Granulocytes 1 %   Abs Immature Granulocytes 0.04 0.00 - 0.07 K/uL  Comprehensive metabolic panel  Result Value Ref Range   Sodium 135 135 - 145 mmol/L   Potassium 4.6 3.5 - 5.1 mmol/L   Chloride 105 98 - 111 mmol/L   CO2 23 22 - 32 mmol/L   Glucose, Bld 104 (H) 70 - 99 mg/dL   BUN 23 8 - 23 mg/dL   Creatinine, Ser 0.84 0.44 - 1.00 mg/dL   Calcium 9.3 8.9 - 10.3 mg/dL   Total Protein 7.0 6.5 - 8.1 g/dL   Albumin 3.7 3.5 - 5.0 g/dL   AST 33 15 - 41 U/L   ALT 15 0 - 44 U/L   Alkaline Phosphatase 58 38 - 126 U/L   Total Bilirubin 1.1 0.3 - 1.2 mg/dL   GFR, Estimated >60 >60 mL/min   Anion gap 7 5 - 15  APTT  Result Value Ref Range   aPTT 29 24 - 36 seconds  Protime-INR  Result Value Ref Range   Prothrombin Time 13.2 11.4 - 15.2  seconds   INR 1.0 0.8 - 1.2  Type and screen [facility]  Result Value Ref Range   ABO/RH(D) O POS    Antibody Screen NEG    Sample Expiration      11/29/2021,2359 Performed at [facility], [address] [place], [place] [address]   Troponin I (High Sensitivity)  Result Value Ref Range   Troponin I (High Sensitivity) 5 <18 ng/L  Troponin I (  High Sensitivity)  Result Value Ref Range   Troponin I (High Sensitivity) 6 <18 ng/L    Imaging:  I personally reviewed radiology studies to include:  CT head 11/26/21  IMPRESSION: 1. Small left subdural hemorrhage overlying the left temporal lobe and insula, measuring up to 0.4 cm in maximum coronal thickness. No evidence of midline shift or mass effect. No overlying skull fracture. 2. Advanced small-vessel white matter disease. 3. No displaced fractures or dislocations of the facial bones. Soft tissue contusion of the right cheek and chin. 4. No fracture or static subluxation of the cervical spine. 5. Osteopenia and multilevel degenerative disc disease of the cervical spine.   These results were called by telephone at the time of interpretation on 11/26/2021 at 12:37 pm to Dr. Hulan Saas , who verbally acknowledged these results.     Electronically Signed   By: Delanna Ahmadi M.D.   On: 11/26/2021 12:42   Impression/Plan:     1.  Diagnosis: Small left temporal SDH   2.  Plan - repeat head CT 6 hours after initial scan - Would not recommend AEDs due to patients age and risk of polypharmacy - follow up in 1 month out patient with head CT prior so long interval inpatient repeat is stable  - No plan for acute neurosurgical intervention at this time - please call with any questions or concerns  Cooper Render PA-C Neurosurgery

## 2021-11-26 NOTE — ED Provider Notes (Signed)
[facility] Provider Note    Event Date/Time   First MD Initiated Contact with Patient 11/26/21 1132     (approximate)   History   Fall and Loss of Consciousness (Pt was pulling weeds and states that is the last thing she remembers before regaining conscious, pt A&O, small lac to right side of chin, c/o pain right hand and right knee, abrasion to right knee. )   HPI  Eileen Walsh is a 88 y.o. female with a past medical history of HTN with patient stating she took her blood pressure medicine this morning, obesity, HDL, A-fib not on anticoagulation, chronic neuropathy and intermittent vertigo who presents via EMS from home for evaluation after syncopal episode and fall.  Patient states he was out weeding her yard when she woke up on the ground with a stinging sensation in her chin as well as some soreness in her right hand and right knee.  She states he does not remember what happened.  She denies any history of seizures.  Does not recall any preceding dizziness, nausea, vomiting, chest pain shortness of breath or any other recent sick symptoms.  No recent fevers, urinary symptoms, weakness numbness tingling or any other recent falls or passing out episodes.  She does not recall any recent vertigo.  She is not on any anticoagulation.  She has no current neck pain, back pain or any other torso or extremity pain.  No recent decreased p.o. intake as well use.      Physical Exam  Triage Vital Signs: ED Triage Vitals  Enc Vitals Group     BP      Pulse      Resp      Temp      Temp src      SpO2      Weight      Height      Head Circumference      Peak Flow      Pain Score      Pain Loc      Pain Edu?      Excl. in [place]?     Most recent vital signs: Vitals:   11/26/21 1200 11/26/21 1300  BP: (!) 196/85 (!) 153/61  Pulse: 83 68  Resp:  14  Temp:    SpO2: 97% 98%    General: Awake, no distress.  CV:  Good peripheral perfusion.  2+ bilateral radial  and PT pulses. Resp:  Normal effort.  Clear bilaterally. Abd:  No distention.  Soft throughout. Other:  Superficial linear hemostatic less than 0.5 cm laceration just right to the midline of the chin and some ecchymosis and mild tenderness over the right cheek without any other gross trauma to the face scalp head or neck.  There is no tenderness along the C/T/L-spine.  PERRLA.  EOMI.  Patient has symmetric grip strength in the bilateral upper extremities she does have some soreness on palpation of the volar aspect of the right palm.  There is no snuffbox tenderness.  No lacerations and there is no decreased range of motion pain on range of motion effusion or deformity of the bilateral shoulders, elbows, wrists, hips or ankles.  No evidence of trauma or injury to the left knee although patient does have an abrasion over the anterior aspect of the right knee and some pain on range of motion.  Sensation is intact light touch throughout all extremities.   ED Results / Procedures / Treatments  Labs (all labs ordered are listed, but only abnormal results are displayed) Labs Reviewed  COMPREHENSIVE METABOLIC PANEL - Abnormal; Notable for the following components:      Result Value   Glucose, Bld 104 (*)    All other components within normal limits  CBC WITH DIFFERENTIAL/PLATELET  APTT  PROTIME-INR  URINALYSIS, COMPLETE (UACMP) WITH MICROSCOPIC  TYPE AND SCREEN  TROPONIN I (HIGH SENSITIVITY)  TROPONIN I (HIGH SENSITIVITY)     EKG  ECG is remarkable for sinus rhythm with a ventricular rate of 71, normal axis, unremarkable intervals without clear evidence of acute ischemia or significant arrhythmia.   RADIOLOGY  CT head, face and C-spine on my interpretation with evidence of a small left subdural without any significant mass effect or other evidence of intracranial hemorrhage or skull fracture or significant C-spine fracture.  I also reviewed radiology interpretation and discussed with  radiologist findings and agree with findings of advanced small vessel white matter disease and contusion over the right cheek and chin as well as osteopenia and degenerative disc disease of the cervical spine.   Chest reviewed by myself shows no focal consoidation, effusion, edema, pneumothorax or other clear acute thoracic process. I also reviewed radiology interpretation and agree with findings described.  Plain film of the right hand and wrist as well as of the right knee reviewed by myself without evidence of fracture or dislocation.  I also reviewed radiology's interpretation.   PROCEDURES:  Critical Care performed: Yes, see critical care procedure note(s)  .1-3 Lead EKG Interpretation  Performed by: Lucrezia Starch, MD Authorized by: Lucrezia Starch, MD     Interpretation: normal     ECG rate assessment: normal     Rhythm: sinus rhythm     Ectopy: none     Conduction: normal   .Critical Care  Performed by: Lucrezia Starch, MD Authorized by: Lucrezia Starch, MD   Critical care provider statement:    Critical care time (minutes):  30   Critical care was necessary to treat or prevent imminent or life-threatening deterioration of the following conditions:  Trauma   Critical care was time spent personally by me on the following activities:  Development of treatment plan with patient or surrogate, discussions with consultants, evaluation of patient's response to treatment, examination of patient, ordering and review of laboratory studies, ordering and review of radiographic studies, ordering and performing treatments and interventions, pulse oximetry, re-evaluation of patient's condition and review of old charts   The patient is on the cardiac monitor to evaluate for evidence of arrhythmia and/or significant heart rate changes.   MEDICATIONS ORDERED IN ED: Medications  labetalol (NORMODYNE) injection 10 mg (0 mg Intravenous Hold 11/26/21 1309)  lactated ringers infusion (has no  administration in time range)  Tdap (BOOSTRIX) injection 0.5 mL (0.5 mLs Intramuscular Given 11/26/21 1204)  acetaminophen (TYLENOL) tablet 1,000 mg (1,000 mg Oral Given 11/26/21 1204)     IMPRESSION / MDM / ASSESSMENT AND PLAN / ED COURSE  I reviewed the triage vital signs and the nursing notes. Patient's presentation is most consistent with acute presentation with potential threat to life or bodily function.                               With regard to patient's fall differential diagnosis includes, but is not limited to facial chin laceration and abrasion to the right knee as well as possible contusion of the  right hand versus possible underlying facial fracture, intracranial hemorrhage, occult C-spine injury and underlying fracture in the right hand or knee.  She otherwise is neurovascular intact and has no evidence at this time CVA or history exam features to suggest toxic ingestion sepsis or acute infectious process.  With regard to sounds very much like a syncopal episode preceding his fall differential considerations include arrhythmia, ACS, anemia, metabolic derangements, vasovagal, orthostatic syncope, dehydration  ECG is remarkable for sinus rhythm with a ventricular rate of 71, normal axis, unremarkable intervals without clear evidence of acute ischemia or significant arrhythmia.  CT head, face and C-spine on my interpretation with evidence of a small left subdural without any significant mass effect or other evidence of intracranial hemorrhage or skull fracture or significant C-spine fracture.  I also reviewed radiology interpretation and discussed with radiologist findings and agree with findings of advanced small vessel white matter disease and contusion over the right cheek and chin as well as osteopenia and degenerative disc disease of the cervical spine.   Chest reviewed by myself shows no focal consoidation, effusion, edema, pneumothorax or other clear acute thoracic process. I  also reviewed radiology interpretation and agree with findings described.  Plain film of the right hand and wrist as well as of the right knee reviewed by myself without evidence of fracture or dislocation.  I also reviewed radiology's interpretation.  Given concern for traumatic subdural I did consult with on-call neurosurgeon Dr. Cari Caraway who recommended 6-hour CT and if this showed stability no further neurological work-up or indication for intervention.  I did recommend ideally keeping blood pressure less than systolic of 0000000.  CBC without leukocytosis or acute anemia.  CMP shows no significant electrolyte or metabolic derangements.  PTT is WNL.  INR is WNL.  At this time is unclear the precise etiology of patient's syncope.  She does not recall any preceding symptoms and is not clearly orthostatic or vasovagal she does remembers bending down to pull a weed.  Given her age and comorbidities with systolic blood pressure over 200 on arrival I think she is high risk for possible cardiac etiology and it is reasonable for her to be observed under medicine service for further monitoring and evaluation.  6-hour repeat head CT ordered.  I explained this to patient and she is amenable this plan.  Right chin lack was cleaned with saline and nonadherent dressing placed.  Offered laceration repair with glue although she is declining this at this time.      FINAL CLINICAL IMPRESSION(S) / ED DIAGNOSES   Final diagnoses:  SDH (subdural hematoma) ([place])  Fall, initial encounter  Syncope, unspecified syncope type  Chin laceration, initial encounter  Contusion of finger of right hand, unspecified finger, initial encounter  Abrasion of right knee, initial encounter     Rx / DC Orders   ED Discharge Orders     None        Note:  This document was prepared using Dragon voice recognition software and may include unintentional dictation errors.   Lucrezia Starch, MD 11/26/21 [phone]

## 2021-11-26 NOTE — Assessment & Plan Note (Signed)
-   Heart rate 88, 68.  Patient's not taking anticoagulants currently. -Telemetry monitoring

## 2021-11-26 NOTE — Assessment & Plan Note (Signed)
Etiology is not clear, possibly due to vasovagal syncope.  No focal neurodeficit on physical examination.  Low suspicions for stroke.  Patient has elevated blood pressure at 206/87 and usually. -Will get 2D echo -Frequent neurochecks -Check orthostatic vital signs -IV fluid: LR 75 cc/h

## 2021-11-27 ENCOUNTER — Other Ambulatory Visit: Payer: Self-pay | Admitting: Neurosurgery

## 2021-11-27 DIAGNOSIS — S065XAA Traumatic subdural hemorrhage with loss of consciousness status unknown, initial encounter: Secondary | ICD-10-CM | POA: Diagnosis not present

## 2021-11-27 LAB — CBG MONITORING, ED: Glucose-Capillary: 89 mg/dL (ref 70–99)

## 2021-11-27 NOTE — ED Notes (Signed)
Pt resting calm and comfortably at this time. NAD noted. Pt denies any needs.   Pt is A&Ox4, pt neurologically intact at this time.   Pt given remote per request. Call bell in reach.

## 2021-11-27 NOTE — ED Notes (Signed)
PT at bedside.

## 2021-11-27 NOTE — ED Notes (Signed)
Pt requests to finish lunch before getting D/C'ed.

## 2021-11-27 NOTE — TOC CM/SW Note (Signed)
Patient has orders to discharge home today. Chart reviewed. PCP is Richard Gilbert, MD. On room air. No wounds. No TOC needs identified. CSW signing off. ? ?Jodi Criscuolo, CSW ?[phone] ? ?

## 2021-11-27 NOTE — ED Notes (Signed)
Pt placed onto hospital bed.

## 2021-11-27 NOTE — ED Notes (Signed)
Pt placed on purewick device.

## 2021-11-27 NOTE — ED Notes (Signed)
D/C, wound care and reasons to return discussed with pt and daughter. Pt educated on any mental status changes, weakness or numbness to return to ED for further eval. Pt ambulatory on D/C with steady gait.

## 2021-11-27 NOTE — ED Notes (Signed)
Pt resting comfortably in bed at this time. Pt is alert and oriented with even and regular respirations. No acute distress noted. Pt denies any needs at this time. Call light within reach.  °

## 2021-11-27 NOTE — Discharge Summary (Signed)
Physician Discharge Summary  Eileen Walsh WUJ:[id number] DOB: 1928/10/03 DOA: 11/26/2021  PCP: [name]., MD  Admit date: 11/26/2021 Discharge date: 11/27/2021  Admitted From: Home Disposition: Home  Recommendations for Outpatient Follow-up:  Follow up with PCP in 1-2 weeks   Home Health: No Equipment/Devices: None  Discharge Condition: Stable CODE STATUS: Full Diet recommendation: Regular  Brief/Interim Summary: 88 y.o. female with medical history significant of PAF not on anticoagulants, hypertension, hyperlipidemia, GERD, CAD, macular degeneration, who presents with syncope.   Per her daughter at the bedside, pt passed out at about 10:00 AM. Pt states that she was pulling a weed at her mailbox, lost her balance and passed out, falling forward and hitting her head.  She attributed this to her chronic bilateral peripheral neuropathy.  She denies prodromal symptoms.  No unilateral numbness or tinglings in extremities, no facial droop or slurred speech. No vision loss or hearing loss.  Denies dizziness or lightheadedness.  Denies any headache. She sustained a small laceration to her chin and small skin abrasion in the right knee.  Denies chest pain, cough, shortness breath.  No nausea vomiting, diarrhea or abdominal pain.  No symptoms of UTI.  Serial CT showed no expansion of bleed.  No neurosurgical intervention warranted.  Patient was stable at time of discharge.  She is seen by physical therapy who did make a recommendation for home health however patient declined.  I had a lengthy discussion with the patient's son and the patient at bedside.  Explained the recommendations.  Patient and son are aware.  Patient ambulating without difficulty.  Stable for discharge home at this time.  Patient not on any blood thinners.  No changes made at home medication regimen.  Follow-up outpatient with neurosurgery in 3 to 4 weeks      Discharge Diagnoses:  Principal Problem:   SDH  (subdural hematoma) (HCC) Active Problems:   Syncope   Hypertensive urgency   HTN (hypertension)   HLD (hyperlipidemia)   CAD (coronary artery disease)   AF (paroxysmal atrial fibrillation) (HCC)   Subdural hematoma (HCC)  SDH (subdural hematoma) (HCC) CT scan showed small subdural hematoma, no mass effect.  Patient is asymptomatic.  Consulted Dr. Marcell Barlow of neurosurgery, who recommended to repeat CT scan in 6 hours.  Repeat CAT scan showed stability of bleed.  No expansion.  No neurologic changes.  Monitored overnight in house.  Stable for discharge the following morning.  Follow-up outpatient neurosurgery 3 to 4 weeks.         Syncope Suspect vasovagal syncope as patient has peripheral neuropathy and was bending down to pick a weed at which point she passed out for short period of time.  Transient loss of consciousness if any.  No loss of control of bowels or bladder.  No tongue biting.  No evidence of seizure activity.  Recommended home with home health, patient declined   Discharge Instructions  Discharge Instructions     Diet - low sodium heart healthy   Complete by: As directed    Discharge wound care:   Complete by: As directed    Wound care  Every other day    Comments: Apply single layer of xeroform to the chin laceration, change every other day, top with foam. Teach patient to apply Vaseline gauze at home if DC. She can obtain from local pharmacy, or apply neosporin and bandaid  11/26/21 1516     Increase activity slowly   Complete by: As directed  Allergies as of 11/27/2021       Reactions   Aspirin Other (See Comments)   Sulfa Antibiotics Hives, Other (See Comments)        Medication List     STOP taking these medications    meclizine 25 MG tablet Commonly known as: ANTIVERT       TAKE these medications    amLODipine-olmesartan 5-40 MG tablet Commonly known as: AZOR Take 1 tablet by mouth daily.   clobetasol 0.05 % external  solution Commonly known as: TEMOVATE Apply 1 application topically 2 (two) times daily. Mix 1 bottle into 1 tub of cerave cream and use qd/bid aa rash on back, sides, buttocks until clear   hydrocortisone 2.5 % cream Apply topically 2 (two) times daily.   ketoconazole 2 % cream Commonly known as: NIZORAL Apply topically daily.   multivitamin with minerals Tabs tablet Take 1 tablet by mouth daily.   naproxen sodium 220 MG tablet Commonly known as: ALEVE Take 1 tablet (220 mg total) by mouth daily as needed. Home med.   omeprazole 20 MG capsule Commonly known as: PRILOSEC TAKE 1 CAPSULE EVERY       MORNING               Discharge Care Instructions  (From admission, onward)           Start     Ordered   11/27/21 0000  Discharge wound care:       Comments: Wound care  Every other day    Comments: Apply single layer of xeroform to the chin laceration, change every other day, top with foam. Teach patient to apply Vaseline gauze at home if DC. She can obtain from local pharmacy, or apply neosporin and bandaid  11/26/21 1516     11/27/21 1048            Allergies  Allergen Reactions   Aspirin Other (See Comments)   Sulfa Antibiotics Hives and Other (See Comments)    Consultations: Neurosurgery   Procedures/Studies: CT HEAD WO CONTRAST ( )  Result Date: 11/26/2021 CLINICAL DATA:  Follow-up subdural hematoma. EXAM: CT HEAD WITHOUT CONTRAST TECHNIQUE: Contiguous axial images were obtained from the base of the skull through the vertex without intravenous contrast. RADIATION DOSE REDUCTION: This exam was performed according to the departmental dose-optimization program which includes automated exposure control, adjustment of the mA and/or kV according to patient size and/or use of iterative reconstruction technique. COMPARISON:  Head CT 6-1/2 hours prior FINDINGS: Brain: Thin left temporal subdural hematoma measuring up to 4 mm in thickness (series 4, image 26 is  without significant interval change from earlier today. There is no associated mass effect or midline shift. No new hemorrhage. Background atrophy and chronic small vessel ischemic change. The basilar cisterns are patent. Vascular: Atherosclerosis of skullbase vasculature without hyperdense vessel or abnormal calcification. Skull: No fracture or focal lesion. Sinuses/Orbits: No change from earlier today. Other: None. IMPRESSION: 1. Thin left temporal subdural hematoma measuring up to 4 mm in thickness is without significant interval change from earlier today. No associated mass effect or midline shift. 2. Background atrophy and chronic small vessel ischemic change. Electronically Signed   By: Narda Rutherford M.D.   On: 11/26/2021 19:01   DG Knee Complete 4 Views Right  Result Date: 11/26/2021 CLINICAL DATA:  Fall EXAM: RIGHT KNEE - COMPLETE 4+ VIEW COMPARISON:  None Available. FINDINGS: Alignment is anatomic. No acute fracture. No joint effusion. Lateral joint space narrowing. Atherosclerotic calcification. IMPRESSION:  No acute fracture. Electronically Signed   By: Guadlupe Spanish M.D.   On: 11/26/2021 13:02   DG Hand Complete Right  Result Date: 11/26/2021 CLINICAL DATA:  fall EXAM: RIGHT HAND - COMPLETE 3+ VIEW COMPARISON:  None Available. FINDINGS: No evidence of acute fracture or joint malalignment. Scattered degenerative change, greatest involving the DIP joints and triscaphe joint. Vascular calcifications. IMPRESSION: No evidence of acute fracture or joint malalignment. Electronically Signed   By: Feliberto Harts M.D.   On: 11/26/2021 13:01   DG Chest Portable 1 View  Result Date: 11/26/2021 CLINICAL DATA:  A 88 year old female presents for evaluation of pain after fall on RIGHT side. EXAM: PORTABLE CHEST 1 VIEW COMPARISON:  July 15, 2016. FINDINGS: The heart size and mediastinal contours are within normal limits. Both lungs are clear. The visualized skeletal structures are unremarkable.  IMPRESSION: No active disease. Electronically Signed   By: Donzetta Kohut M.D.   On: 11/26/2021 13:01   CT HEAD WO CONTRAST ( )  Result Date: 11/26/2021 CLINICAL DATA:  Fall EXAM: CT HEAD WITHOUT CONTRAST CT MAXILLOFACIAL WITHOUT CONTRAST CT CERVICAL SPINE WITHOUT CONTRAST TECHNIQUE: Multidetector CT imaging of the head, cervical spine, and maxillofacial structures were performed using the standard protocol without intravenous contrast. Multiplanar CT image reconstructions of the cervical spine and maxillofacial structures were also generated. RADIATION DOSE REDUCTION: This exam was performed according to the departmental dose-optimization program which includes automated exposure control, adjustment of the mA and/or kV according to patient size and/or use of iterative reconstruction technique. COMPARISON:  07/15/2016 FINDINGS: CT HEAD FINDINGS Brain: Small left subdural hemorrhage overlying the temporal lobe and insula, measuring up to 0.4 cm in maximum coronal thickness (series 4, image 26). No evidence of midline shift or mass effect. Extensive periventricular and deep white matter hypodensity. No evidence of acute infarction, hydrocephalus, or mass lesion/mass effect. Vascular: No hyperdense vessel or unexpected calcification. CT FACIAL BONES FINDINGS Skull: Normal. Negative for fracture or focal lesion. Facial bones: No displaced fractures or dislocations. Sinuses/Orbits: No acute finding. Other: Soft tissue contusion of the right cheek (series 2, image 54) and chin (series 2, image 79). CT CERVICAL SPINE FINDINGS Alignment: Degenerative straightening of the normal cervical lordosis. Skull base and vertebrae: Osteopenia. No acute fracture. No primary bone lesion or focal pathologic process. Soft tissues and spinal canal: No prevertebral fluid or swelling. No visible canal hematoma. Disc levels: Generally mild multilevel disc space height loss and osteophytosis throughout the cervical spine, focally  moderate at C6-C7. Upper chest: Negative. Other: None. IMPRESSION: 1. Small left subdural hemorrhage overlying the left temporal lobe and insula, measuring up to 0.4 cm in maximum coronal thickness. No evidence of midline shift or mass effect. No overlying skull fracture. 2. Advanced small-vessel white matter disease. 3. No displaced fractures or dislocations of the facial bones. Soft tissue contusion of the right cheek and chin. 4. No fracture or static subluxation of the cervical spine. 5. Osteopenia and multilevel degenerative disc disease of the cervical spine. These results were called by telephone at the time of interpretation on 11/26/2021 at 12:37 pm to Dr. Antoine Primas , who verbally acknowledged these results. Electronically Signed   By: Jearld Lesch M.D.   On: 11/26/2021 12:42   CT Maxillofacial Wo Contrast  Result Date: 11/26/2021 CLINICAL DATA:  Fall EXAM: CT HEAD WITHOUT CONTRAST CT MAXILLOFACIAL WITHOUT CONTRAST CT CERVICAL SPINE WITHOUT CONTRAST TECHNIQUE: Multidetector CT imaging of the head, cervical spine, and maxillofacial structures were performed using the standard protocol without  intravenous contrast. Multiplanar CT image reconstructions of the cervical spine and maxillofacial structures were also generated. RADIATION DOSE REDUCTION: This exam was performed according to the departmental dose-optimization program which includes automated exposure control, adjustment of the mA and/or kV according to patient size and/or use of iterative reconstruction technique. COMPARISON:  07/15/2016 FINDINGS: CT HEAD FINDINGS Brain: Small left subdural hemorrhage overlying the temporal lobe and insula, measuring up to 0.4 cm in maximum coronal thickness (series 4, image 26). No evidence of midline shift or mass effect. Extensive periventricular and deep white matter hypodensity. No evidence of acute infarction, hydrocephalus, or mass lesion/mass effect. Vascular: No hyperdense vessel or unexpected  calcification. CT FACIAL BONES FINDINGS Skull: Normal. Negative for fracture or focal lesion. Facial bones: No displaced fractures or dislocations. Sinuses/Orbits: No acute finding. Other: Soft tissue contusion of the right cheek (series 2, image 54) and chin (series 2, image 79). CT CERVICAL SPINE FINDINGS Alignment: Degenerative straightening of the normal cervical lordosis. Skull base and vertebrae: Osteopenia. No acute fracture. No primary bone lesion or focal pathologic process. Soft tissues and spinal canal: No prevertebral fluid or swelling. No visible canal hematoma. Disc levels: Generally mild multilevel disc space height loss and osteophytosis throughout the cervical spine, focally moderate at C6-C7. Upper chest: Negative. Other: None. IMPRESSION: 1. Small left subdural hemorrhage overlying the left temporal lobe and insula, measuring up to 0.4 cm in maximum coronal thickness. No evidence of midline shift or mass effect. No overlying skull fracture. 2. Advanced small-vessel white matter disease. 3. No displaced fractures or dislocations of the facial bones. Soft tissue contusion of the right cheek and chin. 4. No fracture or static subluxation of the cervical spine. 5. Osteopenia and multilevel degenerative disc disease of the cervical spine. These results were called by telephone at the time of interpretation on 11/26/2021 at 12:37 pm to Dr. Antoine Primas , who verbally acknowledged these results. Electronically Signed   By: Jearld Lesch M.D.   On: 11/26/2021 12:42   CT Cervical Spine Wo Contrast  Result Date: 11/26/2021 CLINICAL DATA:  Fall EXAM: CT HEAD WITHOUT CONTRAST CT MAXILLOFACIAL WITHOUT CONTRAST CT CERVICAL SPINE WITHOUT CONTRAST TECHNIQUE: Multidetector CT imaging of the head, cervical spine, and maxillofacial structures were performed using the standard protocol without intravenous contrast. Multiplanar CT image reconstructions of the cervical spine and maxillofacial structures were also  generated. RADIATION DOSE REDUCTION: This exam was performed according to the departmental dose-optimization program which includes automated exposure control, adjustment of the mA and/or kV according to patient size and/or use of iterative reconstruction technique. COMPARISON:  07/15/2016 FINDINGS: CT HEAD FINDINGS Brain: Small left subdural hemorrhage overlying the temporal lobe and insula, measuring up to 0.4 cm in maximum coronal thickness (series 4, image 26). No evidence of midline shift or mass effect. Extensive periventricular and deep white matter hypodensity. No evidence of acute infarction, hydrocephalus, or mass lesion/mass effect. Vascular: No hyperdense vessel or unexpected calcification. CT FACIAL BONES FINDINGS Skull: Normal. Negative for fracture or focal lesion. Facial bones: No displaced fractures or dislocations. Sinuses/Orbits: No acute finding. Other: Soft tissue contusion of the right cheek (series 2, image 54) and chin (series 2, image 79). CT CERVICAL SPINE FINDINGS Alignment: Degenerative straightening of the normal cervical lordosis. Skull base and vertebrae: Osteopenia. No acute fracture. No primary bone lesion or focal pathologic process. Soft tissues and spinal canal: No prevertebral fluid or swelling. No visible canal hematoma. Disc levels: Generally mild multilevel disc space height loss and osteophytosis throughout the cervical  spine, focally moderate at C6-C7. Upper chest: Negative. Other: None. IMPRESSION: 1. Small left subdural hemorrhage overlying the left temporal lobe and insula, measuring up to 0.4 cm in maximum coronal thickness. No evidence of midline shift or mass effect. No overlying skull fracture. 2. Advanced small-vessel white matter disease. 3. No displaced fractures or dislocations of the facial bones. Soft tissue contusion of the right cheek and chin. 4. No fracture or static subluxation of the cervical spine. 5. Osteopenia and multilevel degenerative disc disease of  the cervical spine. These results were called by telephone at the time of interpretation on 11/26/2021 at 12:37 pm to Dr. Antoine Primas , who verbally acknowledged these results. Electronically Signed   By: Jearld Lesch M.D.   On: 11/26/2021 12:42      Subjective: Seen and examined on the day of discharge.  Stable no distress.  Answers all questions appropriately.  Neurologically intact.  Stable for discharge home.  Discharge Exam: Vitals:   11/27/21 0700 11/27/21 0800  BP: (!) 147/71 (!) 161/57  Pulse: 64 73  Resp: 17 17  Temp:    SpO2: 97% 98%   Vitals:   11/27/21 0200 11/27/21 0230 11/27/21 0700 11/27/21 0800  BP: (!) 157/62 (!) 145/59 (!) 147/71 (!) 161/57  Pulse: 66 65 64 73  Resp:  18 17 17   Temp:      TempSrc:      SpO2: 97% 94% 97% 98%    General: Pt is alert, awake, not in acute distress Cardiovascular: RRR, S1/S2 +, no rubs, no gallops Respiratory: CTA bilaterally, no wheezing, no rhonchi Abdominal: Soft, NT, ND, bowel sounds + Extremities: no edema, no cyanosis    The results of significant diagnostics from this hospitalization (including imaging, microbiology, ancillary and laboratory) are listed below for reference.     Microbiology: No results found for this or any previous visit (from the past 240 hour(s)).   Labs: BNP (last 3 results) No results for input(s): "BNP" in the last 8760 hours. Basic Metabolic Panel: Recent Labs  Lab 11/26/21 1220  NA 135  K 4.6  CL 105  CO2 23  GLUCOSE 104*  BUN 23  CREATININE 0.84  CALCIUM 9.3   Liver Function Tests: Recent Labs  Lab 11/26/21 1220  AST 33  ALT 15  ALKPHOS 58  BILITOT 1.1  PROT 7.0  ALBUMIN 3.7   No results for input(s): "LIPASE", "AMYLASE" in the last 168 hours. No results for input(s): "AMMONIA" in the last 168 hours. CBC: Recent Labs  Lab 11/26/21 1220  WBC 8.6  NEUTROABS 5.2  HGB 14.5  HCT 43.5  MCV 91.6  PLT 242   Cardiac Enzymes: No results for input(s): "CKTOTAL",  "CKMB", "CKMBINDEX", "TROPONINI" in the last 168 hours. BNP: Invalid input(s): "POCBNP" CBG: Recent Labs  Lab 11/27/21 0741  GLUCAP 89   D-Dimer No results for input(s): "DDIMER" in the last 72 hours. Hgb A1c No results for input(s): "HGBA1C" in the last 72 hours. Lipid Profile No results for input(s): "CHOL", "HDL", "LDLCALC", "TRIG", "CHOLHDL", "LDLDIRECT" in the last 72 hours. Thyroid function studies No results for input(s): "TSH", "T4TOTAL", "T3FREE", "THYROIDAB" in the last 72 hours.  Invalid input(s): "FREET3" Anemia work up No results for input(s): "VITAMINB12", "FOLATE", "FERRITIN", "TIBC", "IRON", "RETICCTPCT" in the last 72 hours. Urinalysis    Component Value Date/Time   COLORURINE YELLOW (A) 11/26/2021 1420   APPEARANCEUR CLEAR (A) 11/26/2021 1420   LABSPEC 1.012 11/26/2021 1420   PHURINE 5.0 11/26/2021 1420  GLUCOSEU NEGATIVE 11/26/2021 1420   HGBUR NEGATIVE 11/26/2021 1420   BILIRUBINUR NEGATIVE 11/26/2021 1420   KETONESUR NEGATIVE 11/26/2021 1420   PROTEINUR NEGATIVE 11/26/2021 1420   NITRITE NEGATIVE 11/26/2021 1420   LEUKOCYTESUR NEGATIVE 11/26/2021 1420   Sepsis Labs Recent Labs  Lab 11/26/21 1220  WBC 8.6   Microbiology No results found for this or any previous visit (from the past 240 hour(s)).   Time coordinating discharge: Over 30 minutes  SIGNED:   Tresa MooreSudheer B Aliegha Paullin, MD  [facility] 11/27/2021, 3:24 PM Pager   If 7PM-7AM, please contact night-coverage

## 2021-11-27 NOTE — Evaluation (Signed)
Physical Therapy Evaluation Patient Details Name: Eileen Walsh MRN: [id number] DOB: [birth year] Today's Date: 11/27/2021  History of Present Illness  Eileen Walsh is a 88 y.o. female with medical history significant of PAF not on anticoagulants, hypertension, hyperlipidemia, GERD, CAD, macular degeneration, who presents with syncope.     Per her daughter at the bedside, pt passed out at about 10:00 AM. Pt states that she was pulling a weed at her mailbox, lost her balance and passed out, falling forward and hitting her head.  She attributed this to her chronic bilateral peripheral neuropathy.  She denies prodromal symptoms.  No unilateral numbness or tinglings in extremities, no facial droop or slurred speech. No vision loss or hearing loss.  Denies dizziness or lightheadedness.  Denies any headache. She sustained a small laceration to her chin and small skin abrasion in the right knee.  Denies chest pain, cough, shortness breath.  No nausea vomiting, diarrhea or abdominal pain.  No symptoms of UTI.  Clinical Impression  Pt see in ED agreed to participate  in PT evaluation. Daughter present through out the session. Pt demonstrates B Hip muscle strength deficits of 3/5 and decreased sensation in B feet due to peripheral neuropathy. Pt able ot ambulate with SPC and transfers independently with LOB corrected by pt. 5 x STS in 17 secs and unable to perform SL stand. Decreased tandem standing <10 secs. Pt at risk of fall. Pt will benefit from [facility] PT to improve safety with all functional mobility at household level and community level activity participation when discharged from acute care. However, pt is refusing any HH PT at this point. Daughter also agrees that pt's choice. Pt advised to use RW for outdoors and [facility] indoor at all time. Pt educated regarding her fall risk and the significance of HH PT.      Recommendations for follow up therapy are one component of a multi-disciplinary discharge planning  process, led by the attending physician.  Recommendations may be updated based on patient status, additional functional criteria and insurance authorization.  Follow Up Recommendations Home health PT    Assistance Recommended at Discharge PRN  Patient can return home with the following  A little help with walking and/or transfers;A little help with bathing/dressing/bathroom;Help with stairs or ramp for entrance;Assistance with cooking/housework    Equipment Recommendations None recommended by PT (has all required equipments)  Recommendations for Other Services       Functional Status Assessment Patient has not had a recent decline in their functional status     Precautions / Restrictions Precautions Precautions: Fall Restrictions Weight Bearing Restrictions: No      Mobility  Bed Mobility Overal bed mobility: Independent                  Transfers Overall transfer level: Independent Equipment used: Straight cane                    Ambulation/Gait Ambulation/Gait assistance: Independent Gait Distance (Feet): 120 Feet Assistive device: Straight cane Gait Pattern/deviations: Step-through pattern (uneven syep lenght and mildl unsteady) Gait velocity: decreased        Stairs Stairs:  (pt refused to attempt stairs. daughter feel fine helping her at home.)          Wheelchair Mobility    Modified Rankin (Stroke Patients Only)       Balance Overall balance assessment: Needs assistance Sitting-balance support: Feet supported Sitting balance-Leahy Scale: Normal     Standing balance support:  Single extremity supported       Single Leg Stance - Left Leg: 0 Tandem Stance - Right Leg: 5 Tandem Stance - Left Leg: 8         Standardized Balance Assessment Standardized Balance Assessment :  (5 x STS 17 secs at  risk of fall)           Pertinent Vitals/Pain Pain Assessment Pain Assessment: No/denies pain    Home Living Family/patient  expects to be discharged to:: Private residence Living Arrangements: Alone Available Help at Discharge: Family;Available PRN/intermittently Type of Home: House Home Access: Stairs to enter   Entrance Stairs-Number of Steps: 5   Home Layout: One level Home Equipment: Rollator (4 wheels);Cane - single point;Shower seat;Hand held shower head;Grab bars - toilet;Grab bars - tub/shower;Toilet riser      Prior Function Prior Level of Function : Independent/Modified Independent;Driving             Mobility Comments: uses cane at baseline ADLs Comments: independent at baseline     Hand Dominance   Dominant Hand: Right    Extremity/Trunk Assessment   Upper Extremity Assessment Upper Extremity Assessment: Overall WFL for tasks assessed    Lower Extremity Assessment Lower Extremity Assessment: Generalized weakness (B hips 3/5, B knee and annkles MMT 4/5)       Communication   Communication: No difficulties  Cognition Arousal/Alertness: Awake/alert Behavior During Therapy: WFL for tasks assessed/performed Overall Cognitive Status: Within Functional Limits for tasks assessed                                          General Comments      Exercises     Assessment/Plan    PT Assessment Patient does not need any further PT services  PT Problem List         PT Treatment Interventions      PT Goals (Current goals can be found in the Care Plan section)  Acute Rehab PT Goals Patient Stated Goal:  (n/a)    Frequency       Co-evaluation               AM-PAC PT "6 Clicks" Mobility  Outcome Measure Help needed turning from your back to your side while in a flat bed without using bedrails?: None Help needed moving from lying on your back to sitting on the side of a flat bed without using bedrails?: None Help needed moving to and from a bed to a chair (including a wheelchair)?: None Help needed standing up from a chair using your arms (e.g.,  wheelchair or bedside chair)?: None Help needed to walk in hospital room?: None Help needed climbing 3-5 steps with a railing? : A Lot 6 Click Score: 22    End of Session Equipment Utilized During Treatment: Gait belt Activity Tolerance: Patient tolerated treatment well Patient left: in chair;with family/visitor present Nurse Communication: Mobility status (and fall risk)      Time: 2122-4825 PT Time Calculation (min) (ACUTE ONLY): 27 min   Charges:   PT Evaluation $PT Eval Low Complexity: 1 Low PT Treatments $Gait Training: 8-22 mins $Therapeutic Activity: 8-22 mins       Janet Berlin PT DPT 12:47 PM,11/27/21   Catelyn Friel H Juni Glaab 11/27/2021, 12:40 PM

## 2021-12-07 ENCOUNTER — Other Ambulatory Visit: Payer: Self-pay

## 2021-12-07 ENCOUNTER — Emergency Department: Payer: Medicare Other

## 2021-12-07 ENCOUNTER — Inpatient Hospital Stay
Admission: EM | Admit: 2021-12-07 | Discharge: 2021-12-10 | DRG: 872 | Disposition: A | Discharge status: Skilled Nursing Facility | Payer: Medicare Other | Attending: Internal Medicine | Admitting: Internal Medicine

## 2021-12-07 DIAGNOSIS — E785 Hyperlipidemia, unspecified: Secondary | ICD-10-CM | POA: Diagnosis present

## 2021-12-07 DIAGNOSIS — N390 Urinary tract infection, site not specified: Secondary | ICD-10-CM | POA: Diagnosis not present

## 2021-12-07 DIAGNOSIS — A419 Sepsis, unspecified organism: Principal | ICD-10-CM | POA: Diagnosis present

## 2021-12-07 DIAGNOSIS — H3530 Unspecified macular degeneration: Secondary | ICD-10-CM | POA: Diagnosis present

## 2021-12-07 DIAGNOSIS — N179 Acute kidney failure, unspecified: Secondary | ICD-10-CM | POA: Diagnosis present

## 2021-12-07 DIAGNOSIS — Z9049 Acquired absence of other specified parts of digestive tract: Secondary | ICD-10-CM

## 2021-12-07 DIAGNOSIS — Z882 Allergy status to sulfonamides status: Secondary | ICD-10-CM

## 2021-12-07 DIAGNOSIS — R778 Other specified abnormalities of plasma proteins: Secondary | ICD-10-CM | POA: Diagnosis present

## 2021-12-07 DIAGNOSIS — Z83438 Family history of other disorder of lipoprotein metabolism and other lipidemia: Secondary | ICD-10-CM

## 2021-12-07 DIAGNOSIS — G629 Polyneuropathy, unspecified: Secondary | ICD-10-CM | POA: Diagnosis present

## 2021-12-07 DIAGNOSIS — R531 Weakness: Secondary | ICD-10-CM | POA: Diagnosis not present

## 2021-12-07 DIAGNOSIS — K5792 Diverticulitis of intestine, part unspecified, without perforation or abscess without bleeding: Secondary | ICD-10-CM | POA: Diagnosis present

## 2021-12-07 DIAGNOSIS — W19XXXA Unspecified fall, initial encounter: Secondary | ICD-10-CM | POA: Diagnosis present

## 2021-12-07 DIAGNOSIS — I2510 Atherosclerotic heart disease of native coronary artery without angina pectoris: Secondary | ICD-10-CM | POA: Diagnosis present

## 2021-12-07 DIAGNOSIS — M797 Fibromyalgia: Secondary | ICD-10-CM | POA: Diagnosis present

## 2021-12-07 DIAGNOSIS — Z818 Family history of other mental and behavioral disorders: Secondary | ICD-10-CM

## 2021-12-07 DIAGNOSIS — F419 Anxiety disorder, unspecified: Secondary | ICD-10-CM | POA: Diagnosis present

## 2021-12-07 DIAGNOSIS — I480 Paroxysmal atrial fibrillation: Secondary | ICD-10-CM | POA: Diagnosis present

## 2021-12-07 DIAGNOSIS — Z8249 Family history of ischemic heart disease and other diseases of the circulatory system: Secondary | ICD-10-CM

## 2021-12-07 DIAGNOSIS — Z886 Allergy status to analgesic agent status: Secondary | ICD-10-CM

## 2021-12-07 DIAGNOSIS — I10 Essential (primary) hypertension: Secondary | ICD-10-CM | POA: Diagnosis present

## 2021-12-07 LAB — LACTIC ACID, PLASMA
Lactic Acid, Venous: 4.7 mmol/L (ref 0.5–1.9)
Lactic Acid, Venous: 3.3 mmol/L (ref 0.5–1.9)

## 2021-12-07 LAB — CBC WITH DIFFERENTIAL/PLATELET
Abs Immature Granulocytes: 1.11 10*3/uL — ABNORMAL HIGH (ref 0.00–0.07)
Basophils Absolute: 0.1 10*3/uL (ref 0.0–0.1)
Basophils Relative: 0 %
Eosinophils Absolute: 0 10*3/uL (ref 0.0–0.5)
Eosinophils Relative: 0 %
HCT: 40.4 % (ref 36.0–46.0)
Hemoglobin: 13.5 g/dL (ref 12.0–15.0)
Immature Granulocytes: 3 %
Lymphocytes Relative: 2 %
Lymphs Abs: 0.6 10*3/uL — ABNORMAL LOW (ref 0.7–4.0)
MCH: 31.2 pg (ref 26.0–34.0)
MCHC: 33.4 g/dL (ref 30.0–36.0)
MCV: 93.3 fL (ref 80.0–100.0)
Monocytes Absolute: 2 10*3/uL — ABNORMAL HIGH (ref 0.1–1.0)
Monocytes Relative: 5 %
Neutro Abs: 34.9 10*3/uL — ABNORMAL HIGH (ref 1.7–7.7)
Neutrophils Relative %: 90 %
Platelets: 231 10*3/uL (ref 150–400)
RBC: 4.33 MIL/uL (ref 3.87–5.11)
RDW: 13 % (ref 11.5–15.5)
Smear Review: NORMAL
WBC: 38.7 10*3/uL — ABNORMAL HIGH (ref 4.0–10.5)
nRBC: 0 % (ref 0.0–0.2)

## 2021-12-07 LAB — COMPREHENSIVE METABOLIC PANEL
ALT: 21 U/L (ref 0–44)
AST: 41 U/L (ref 15–41)
Albumin: 3.2 g/dL — ABNORMAL LOW (ref 3.5–5.0)
Alkaline Phosphatase: 62 U/L (ref 38–126)
Anion gap: 9 (ref 5–15)
BUN: 26 mg/dL — ABNORMAL HIGH (ref 8–23)
CO2: 20 mmol/L — ABNORMAL LOW (ref 22–32)
Calcium: 9.1 mg/dL (ref 8.9–10.3)
Chloride: 109 mmol/L (ref 98–111)
Creatinine, Ser: 1.8 mg/dL — ABNORMAL HIGH (ref 0.44–1.00)
GFR, Estimated: 26 mL/min — ABNORMAL LOW (ref >60)
Glucose, Bld: 156 mg/dL — ABNORMAL HIGH (ref 70–99)
Potassium: 4.1 mmol/L (ref 3.5–5.1)
Sodium: 138 mmol/L (ref 135–145)
Total Bilirubin: 0.6 mg/dL (ref 0.3–1.2)
Total Protein: 6.4 g/dL — ABNORMAL LOW (ref 6.5–8.1)

## 2021-12-07 LAB — TROPONIN I (HIGH SENSITIVITY)
Troponin I (High Sensitivity): 496 ng/L (ref <18)
Troponin I (High Sensitivity): 357 ng/L (ref <18)

## 2021-12-07 LAB — URINALYSIS, ROUTINE W REFLEX MICROSCOPIC
Bilirubin Urine: NEGATIVE
Glucose, UA: NEGATIVE mg/dL
Hgb urine dipstick: NEGATIVE
Ketones, ur: NEGATIVE mg/dL
Nitrite: NEGATIVE
Protein, ur: NEGATIVE mg/dL
Specific Gravity, Urine: 1.009 (ref 1.005–1.030)
Squamous Epithelial / HPF: NONE SEEN (ref 0–5)
WBC, UA: >50 WBC/hpf — ABNORMAL HIGH (ref 0–5)
pH: 5 (ref 5.0–8.0)

## 2021-12-07 LAB — CBG MONITORING, ED: Glucose-Capillary: 149 mg/dL — ABNORMAL HIGH (ref 70–99)

## 2021-12-07 MED ORDER — HYDRALAZINE HCL 10 MG PO TABS
10.0000 mg | ORAL_TABLET | Freq: Four times a day (QID) | ORAL | Status: DC | PRN
  Administered 2021-12-09: 10 mg via ORAL
  Filled 2021-12-07 (×2): qty 1

## 2021-12-07 MED ORDER — SODIUM CHLORIDE 0.9 % IV SOLN
2.0000 g | Freq: Once | INTRAVENOUS | Status: AC
  Administered 2021-12-07: 2 g via INTRAVENOUS
  Filled 2021-12-07: qty 12.5

## 2021-12-07 MED ORDER — CLOBETASOL PROPIONATE 0.05 % EX SOLN: 1.0000 | Freq: Two times a day (BID) | CUTANEOUS | Status: DC | PRN

## 2021-12-07 MED ORDER — HEPARIN SODIUM (PORCINE) 5000 UNIT/ML IJ SOLN
5000.0000 [IU] | Freq: Two times a day (BID) | INTRAMUSCULAR | Status: DC
Start: 2021-12-07 — End: 2021-12-10
  Administered 2021-12-07 – 2021-12-10 (×6): 5000 [IU] via SUBCUTANEOUS
  Filled 2021-12-07 (×6): qty 1

## 2021-12-07 MED ORDER — LACTATED RINGERS IV SOLN: INTRAVENOUS | Status: AC

## 2021-12-07 MED ORDER — ORAL CARE MOUTH RINSE: 15.0000 mL | OROMUCOSAL | Status: DC | PRN

## 2021-12-07 MED ORDER — HYDROCORTISONE 1 % EX CREA
TOPICAL_CREAM | Freq: Two times a day (BID) | CUTANEOUS | Status: DC
Start: 2021-12-07 — End: 2021-12-10
  Filled 2021-12-07: qty 28
  Filled 2021-12-07: qty 1

## 2021-12-07 MED ORDER — SODIUM CHLORIDE 0.9 % IV SOLN
1.0000 g | INTRAVENOUS | Status: DC
  Administered 2021-12-08 – 2021-12-09 (×2): 1 g via INTRAVENOUS
  Filled 2021-12-07 (×2): qty 1

## 2021-12-07 MED ORDER — AMLODIPINE-OLMESARTAN 5-40 MG PO TABS: 1.0000 | ORAL_TABLET | Freq: Every day | ORAL | Status: DC

## 2021-12-07 MED ORDER — LACTATED RINGERS IV BOLUS
2000.0000 mL | Freq: Once | INTRAVENOUS | Status: AC
  Administered 2021-12-07: 2000 mL via INTRAVENOUS

## 2021-12-07 MED ORDER — CLOBETASOL PROPIONATE 0.05 % EX SOLN
1.0000 | Freq: Two times a day (BID) | CUTANEOUS | Status: DC
Start: 2021-12-07 — End: 2021-12-07

## 2021-12-07 MED ORDER — SODIUM CHLORIDE 0.9 % IV SOLN
INTRAVENOUS | Status: DC
Start: 2021-12-07 — End: 2021-12-07

## 2021-12-07 MED ORDER — VANCOMYCIN HCL IN DEXTROSE 1-5 GM/200ML-% IV SOLN
1000.0000 mg | Freq: Once | INTRAVENOUS | Status: DC
  Administered 2021-12-07: 1000 mg via INTRAVENOUS
  Filled 2021-12-07: qty 200

## 2021-12-07 MED ORDER — KETOCONAZOLE 2 % EX CREA
TOPICAL_CREAM | Freq: Every day | CUTANEOUS | Status: DC
  Filled 2021-12-07: qty 15

## 2021-12-07 MED ORDER — METRONIDAZOLE 500 MG/100ML IV SOLN
500.0000 mg | Freq: Once | INTRAVENOUS | Status: AC
  Administered 2021-12-07: 500 mg via INTRAVENOUS
  Filled 2021-12-07: qty 100

## 2021-12-07 MED ORDER — PANTOPRAZOLE SODIUM 40 MG PO TBEC
40.0000 mg | DELAYED_RELEASE_TABLET | Freq: Every day | ORAL | Status: DC
  Administered 2021-12-08 – 2021-12-10 (×3): 40 mg via ORAL
  Filled 2021-12-07 (×3): qty 1

## 2021-12-07 MED ORDER — ASPIRIN 81 MG PO CHEW
324.0000 mg | CHEWABLE_TABLET | Freq: Once | ORAL | Status: AC
  Administered 2021-12-07: 324 mg via ORAL

## 2021-12-07 NOTE — ED Provider Notes (Signed)
[facility] Provider Note    Event Date/Time   First MD Initiated Contact with Patient 12/07/21 1241     (approximate)   History   Weakness (Since Friday , bilateral arm weakness, feet neuropathy, no slurred speech , )   HPI  CONYA FREERKSEN is a 88 y.o. female past medical history of paroxysmal A-fib not on anticoagulation, hypertension hyperlipidemia GERD, coronary disease and macular degeneration who presents after a fall.  Patient says she has not been feeling good for about a month.  She got up this morning initially watch TV then went back to bed and then got up to use the bathroom and fell backwards.  Says that her legs just felt weak and gave out on her.  She has difficulty telling me if she lost consciousness but says she remembers everything.  She was unable to get up on her own because of her legs feeling weak.  Says this is not typical when she would normally be able to get up.  Did hit her head.  She denies chest or abdominal pain denies fevers chills does note that her urine is cloudy but denies frank dysuria.  Has mild cough but denies shortness of breath or chest pain.  Patient was recently admitted for a small subdural hematoma, which was stable and treated with supportive care.     Past Medical History:  Diagnosis Date   Arthritis    Fibromyalgia    Hypertension    Macular degeneration     Patient Active Problem List   Diagnosis Date Noted   Subdural hematoma (HCC) 11/27/2021   SDH (subdural hematoma) (HCC) 11/26/2021   HTN (hypertension) 11/26/2021   Hypertensive urgency 11/26/2021   Syncope 11/26/2021   Generalized weakness 07/02/2021   Vertigo 07/02/2021   AF (paroxysmal atrial fibrillation) (HCC) 07/02/2021   Essential hypertension 01/12/2019   Atrial fibrillation with RVR (HCC) 07/16/2016   Arthritis 11/05/2014   Back ache 11/05/2014   Clinical depression 11/05/2014   Diabetes (HCC) 11/05/2014   Fibrositis 11/05/2014   Acid  reflux 11/05/2014   Blood glucose elevated 11/05/2014   HLD (hyperlipidemia) 11/05/2014   BP (high blood pressure) 11/05/2014   Neuropathy 11/05/2014   Adiposity 11/05/2014   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   Arteriosclerosis of coronary artery 04/06/2014   Essential (primary) hypertension 04/06/2014   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/2014   CAD (coronary artery disease) 04/06/2014     Physical Exam  Triage Vital Signs: ED Triage Vitals  Enc Vitals Group     BP 12/07/21 1249 130/63     Pulse Rate 12/07/21 1246 85     Resp 12/07/21 1246 17     Temp 12/07/21 1246 98.3 F (36.8 C)     Temp Source 12/07/21 1246 Oral     SpO2 12/07/21 1246 98 %     Weight 12/07/21 1247 169 lb 12.1 oz (77 kg)     Height 12/07/21 1247 5\' 7"  (1.702 m)     Head Circumference --      Peak Flow --      Pain Score 12/07/21 1246 0     Pain Loc --      Pain Edu? --      Excl. in GC? --     Most recent vital signs: Vitals:   12/07/21 1246 12/07/21 1249  BP:  130/63  Pulse: 85 85  Resp: 17 19  Temp: 98.3 F (36.8 C)  SpO2: 98% 97%     General: Awake, no distress.  Ecchymosis on the right side of face that appears old CV:  Good peripheral perfusion.  No peripheral edema Resp:  Normal effort.  No increased work of breathing Abd:  No distention.  Mild tenderness to palpation in the epigastric region right upper quadrant Neuro:             Awake, Alert, Oriented x 3  Other:  Aox3, nml speech  PERRL, EOMI, face symmetric, nml tongue movement  5/5 strength in the BL upper and lower extremities  Sensation grossly intact in the BL upper and lower extremities  Finger-nose-finger intact BL    ED Results / Procedures / Treatments  Labs (all labs ordered are listed, but only abnormal results are displayed) Labs Reviewed  COMPREHENSIVE METABOLIC PANEL - Abnormal; Notable for the following components:      Result Value   CO2 20 (*)    Glucose, Bld 156 (*)     BUN 26 (*)    Creatinine, Ser 1.80 (*)    Total Protein 6.4 (*)    Albumin 3.2 (*)    GFR, Estimated 26 (*)    All other components within normal limits  CBC WITH DIFFERENTIAL/PLATELET - Abnormal; Notable for the following components:   WBC 38.7 (*)    Neutro Abs 34.9 (*)    Lymphs Abs 0.6 (*)    Monocytes Absolute 2.0 (*)    Abs Immature Granulocytes 1.11 (*)    All other components within normal limits  URINALYSIS, ROUTINE W REFLEX MICROSCOPIC - Abnormal; Notable for the following components:   Color, Urine YELLOW (*)    APPearance CLOUDY (*)    Leukocytes,Ua LARGE (*)    WBC, UA >50 (*)    Bacteria, UA MANY (*)    All other components within normal limits  LACTIC ACID, PLASMA - Abnormal; Notable for the following components:   Lactic Acid, Venous 4.7 (*)    All other components within normal limits  CBG MONITORING, ED - Abnormal; Notable for the following components:   Glucose-Capillary 149 (*)    All other components within normal limits  TROPONIN I (HIGH SENSITIVITY) - Abnormal; Notable for the following components:   Troponin I (High Sensitivity) 496 (*)    All other components within normal limits  CULTURE, BLOOD (ROUTINE X 2)  CULTURE, BLOOD (ROUTINE X 2)  URINE CULTURE  LACTIC ACID, PLASMA  TROPONIN I (HIGH SENSITIVITY)     EKG  EKG interpretation performed by myself: NSR, nml axis, nml intervals, no acute ischemic changes    RADIOLOGY I reviewed and interpreted the CXR which does not show any acute cardiopulmonary process    PROCEDURES:  Critical Care performed: Yes, see critical care procedure note(s)  .1-3 Lead EKG Interpretation  Performed by: Georga Hacking, MD Authorized by: Georga Hacking, MD     Interpretation: normal     ECG rate assessment: normal     Rhythm: sinus rhythm     Ectopy: none     Conduction: normal     The patient is on the cardiac monitor to evaluate for evidence of arrhythmia and/or significant heart rate  changes.   MEDICATIONS ORDERED IN ED: Medications  lactated ringers infusion ( Intravenous New Bag/Given 12/07/21 1344)  metroNIDAZOLE (FLAGYL) IVPB 500 mg (500 mg Intravenous New Bag/Given 12/07/21 1501)  vancomycin (VANCOCIN) IVPB 1000 mg/200 mL premix (has no administration in time range)  lactated ringers bolus 2,000 mL (  2,000 mLs Intravenous New Bag/Given 12/07/21 1343)  ceFEPIme (MAXIPIME) 2 g in sodium chloride 0.9 % 100 mL IVPB (0 g Intravenous Stopped 12/07/21 1500)  aspirin chewable tablet 324 mg (324 mg Oral Given 12/07/21 1500)     IMPRESSION / MDM / ASSESSMENT AND PLAN / ED COURSE  I reviewed the triage vital signs and the nursing notes.                              Patient's presentation is most consistent with acute presentation with potential threat to life or bodily function.  Differential diagnosis includes, but is not limited to, infection from UTI, pneumonia, acute coronary syndrome, less likely CVA, intracranial hemorrhage  The patient is a 88 year old female with recent admission for subdural after a fall presents today after a fall with generalized weakness.  Says that her legs just gave out and she was unable to get back up.  Has been feeling more weak.  She however denies really focal symptoms other than that her urine is cloudy she denies dysuria abdominal pain fevers chills.  Does say she has mild cough but is not dyspneic.  Denies abdominal pain.  On exam she has old bruising on the face no new signs of trauma.  Her abdomen is slightly tender in the lower quadrants but she denies pain when I am not palpating.  No increased work of breathing and her neurologic exam is nonfocal.  Initially a code stroke was called EMS however patient has no focal neurologic deficits and Dr. Amada Jupiter with neurology canceled the code stroke.  Patient's vitals are reassuring.  However her labs are quite deranged she has a leukocytosis of almost 40 with a lactate of 4.7 which is certainly  concerning for systemic infection.  Her UA does have greater than 50 WBCs suspect UTI especially with the change in urine color.  Prior to identifying the source she was covered broadly with such pain bank and Flagyl.  She also has an AKI with a creatinine of 1.8 from baseline of around 1 and her troponin is almost 500.  Her EKG is nonischemic and she has no chest pain.  Will defer heparin given her recent subdural and the fact that she is not having active symptoms.  We will give aspirin.  She has received 2 L of fluid blood pressure remained stable.  CT head without acute changes.  I did obtain a CT chest abdomen pelvis to evaluate for further sources of infection.  There are some chronic findings including a groundglass opacity in the lung that was seen prior a left upper quadrant mass that is somewhat larger but also seen prior and diverticulosis with some slight stranding which could represent diverticulitis.  No surgical process and no signs of urinary obstruction.  Ultimately suspect sepsis in setting of UTI.  Discussed with hospitalist for admission.   Clinical Course as of 12/07/21 1522  Sun Dec 07, 2021  1243 Glucose-Capillary(!): 149 [KM]    Clinical Course User Index [KM] Georga Hacking, MD     FINAL CLINICAL IMPRESSION(S) / ED DIAGNOSES   Final diagnoses:  Sepsis, due to unspecified organism, unspecified whether acute organ dysfunction present [facility])  Urinary tract infection without hematuria, site unspecified     Rx / DC Orders   ED Discharge Orders     None        Note:  This document was prepared using Dragon voice recognition  software and may include unintentional dictation errors.   Georga Hacking, MD 12/07/21 [phone]

## 2021-12-07 NOTE — Progress Notes (Signed)
[facility]link following for sepsis protocol complete.

## 2021-12-07 NOTE — Progress Notes (Signed)
CODE SEPSIS - PHARMACY COMMUNICATION  **Broad Spectrum Antibiotics should be administered within 1 hour of Sepsis diagnosis**  Time Code Sepsis Called/Page Received: 1338  Antibiotics Ordered: Cefepime + metronidazole + vancomycin  Time of 1st antibiotic administration: 1408  Additional action taken by pharmacy: N/A  Tressie Ellis 12/07/2021  2:21 PM

## 2021-12-07 NOTE — H&P (Signed)
History and Physical    Eileen Walsh:[id number] DOB: Dec 17, 1928 DOA: 12/07/2021  PCP: [name]., MD (Confirm with patient/family/NH records and if not entered, this has to be entered at [facility] point of entry) Patient coming from: Home  I have personally briefly reviewed patient's old medical records in [facility]  Chief Complaint: Feeling weak of both legs, fall  HPI: Eileen Walsh is a 88 y.o. female with medical history significant of PAF not on systemic anticoagulation, HTN, HLD, GERD, CAD, macular degeneration, recent subdural hematoma November 26, 2021, came with worsening of generalized weakness and fall.  Symptoms started 2 days ago, patient developed generalized weakness especially bilateral lower extremities, she felt " legs feel heavy", and last night patient fell down for worsening of bilateral leg weakness, denies any LOC, no head injury.  She noticed cloudy urine but denies any dysuria or urinary frequency.  No back pain no fever or chills.  ED Course: No tachycardia no hypotension afebrile.  ED triggered code stroke, neurology evaluated patient and recommended to manage by medicine and consult code.  UA compatible with UTI, WBC 38.  CT abdomen pel with no acute finding but mild diverticulitis suspected.  Patient however denied any abdominal pain or diarrhea.  Review of Systems: As per HPI otherwise 14 point review of systems negative.    Past Medical History:  Diagnosis Date   Arthritis    Fibromyalgia    Hypertension    Macular degeneration     Past Surgical History:  Procedure Laterality Date   ABDOMINAL HYSTERECTOMY  1970   BREAST SURGERY  1985   Breast Bx   CHOLECYSTECTOMY       reports that she has never smoked. She has never used smokeless tobacco. She reports that she does not currently use alcohol. She reports that she does not use drugs.  Allergies  Allergen Reactions   Aspirin Other (See Comments)   Sulfa Antibiotics Hives and  Other (See Comments)    Family History  Problem Relation Age of Onset   Hypertension Mother    Arthritis Mother    Hyperlipidemia Mother    Heart disease Father    Hypertension Brother    Stroke Brother    Diabetes Brother    Arthritis Brother    Heart disease Brother    Anxiety disorder Brother        severe. had nervous breakdown     Prior to Admission medications   Medication Sig Start Date End Date Taking? Authorizing Provider  amLODipine-olmesartan (AZOR) 5-40 MG tablet Take 1 tablet by mouth daily. 08/07/21   [name]., MD  clobetasol (TEMOVATE) 0.05 % external solution Apply 1 application topically 2 (two) times daily. Mix 1 bottle into 1 tub of cerave cream and use qd/bid aa rash on back, sides, buttocks until clear 05/06/21   Willeen Niece, MD  hydrocortisone 2.5 % cream Apply topically 2 (two) times daily. 11/13/21   [provider]  ketoconazole (NIZORAL) 2 % cream Apply topically daily. 11/13/21   [provider]  Multiple Vitamin (MULTIVITAMIN WITH MINERALS) TABS tablet Take 1 tablet by mouth daily.    [provider]  naproxen sodium (ALEVE) 220 MG tablet Take 1 tablet (220 mg total) by mouth daily as needed. Home med. 07/04/21   Darlin Priestly, MD  omeprazole (PRILOSEC) 20 MG capsule TAKE 1 CAPSULE EVERY       MORNING 07/28/21   [name]., MD  Physical Exam: Vitals:   12/07/21 1246 12/07/21 1247 12/07/21 1249  BP:   130/63  Pulse: 85  85  Resp: 17  19  Temp: 98.3 F (36.8 C)    TempSrc: Oral    SpO2: 98%  97%  Weight:  77 kg   Height:  5\' 7"  (1.702 m)     Constitutional: NAD, calm, comfortable Vitals:   12/07/21 1246 12/07/21 1247 12/07/21 1249  BP:   130/63  Pulse: 85  85  Resp: 17  19  Temp: 98.3 F (36.8 C)    TempSrc: Oral    SpO2: 98%  97%  Weight:  77 kg   Height:  5\' 7"  (1.702 m)    Eyes: PERRL, lids and conjunctivae normal ENMT: Mucous membranes are moist. Posterior pharynx clear of any exudate  or lesions.Normal dentition.  Neck: normal, supple, no masses, no thyromegaly Respiratory: clear to auscultation bilaterally, no wheezing, no crackles. Normal respiratory effort. No accessory muscle use.  Cardiovascular: Regular rate and rhythm, no murmurs / rubs / gallops. No extremity edema. 2+ pedal pulses. No carotid bruits.  Abdomen: no tenderness, no masses palpated. No hepatosplenomegaly. Bowel sounds positive.  Musculoskeletal: no clubbing / cyanosis. No joint deformity upper and lower extremities. Good ROM, no contractures. Normal muscle tone.  Skin: no rashes, lesions, ulcers. No induration Neurologic: CN 2-12 grossly intact. Sensation intact, DTR normal. Strength 5/5 in all 4.  Psychiatric: Normal judgment and insight. Alert and oriented x 3. Normal mood.     Labs on Admission: I have personally reviewed following labs and imaging studies  CBC: Recent Labs  Lab 12/07/21 1245  WBC 38.7*  NEUTROABS 34.9*  HGB 13.5  HCT 40.4  MCV 93.3  PLT 231   Basic Metabolic Panel: Recent Labs  Lab 12/07/21 1245  NA 138  K 4.1  CL 109  CO2 20*  GLUCOSE 156*  BUN 26*  CREATININE 1.80*  CALCIUM 9.1   GFR: Estimated Creatinine Clearance: 22.2 mL/min (A) (by C-G formula based on SCr of 1.8 mg/dL (H)). Liver Function Tests: Recent Labs  Lab 12/07/21 1245  AST 41  ALT 21  ALKPHOS 62  BILITOT 0.6  PROT 6.4*  ALBUMIN 3.2*   No results for input(s): "LIPASE", "AMYLASE" in the last 168 hours. No results for input(s): "AMMONIA" in the last 168 hours. Coagulation Profile: No results for input(s): "INR", "PROTIME" in the last 168 hours. Cardiac Enzymes: No results for input(s): "CKTOTAL", "CKMB", "CKMBINDEX", "TROPONINI" in the last 168 hours. BNP (last 3 results) No results for input(s): "PROBNP" in the last 8760 hours. HbA1C: No results for input(s): "HGBA1C" in the last 72 hours. CBG: Recent Labs  Lab 12/07/21 1241  GLUCAP 149*   Lipid Profile: No results for  input(s): "CHOL", "HDL", "LDLCALC", "TRIG", "CHOLHDL", "LDLDIRECT" in the last 72 hours. Thyroid Function Tests: No results for input(s): "TSH", "T4TOTAL", "FREET4", "T3FREE", "THYROIDAB" in the last 72 hours. Anemia Panel: No results for input(s): "VITAMINB12", "FOLATE", "FERRITIN", "TIBC", "IRON", "RETICCTPCT" in the last 72 hours. Urine analysis:    Component Value Date/Time   COLORURINE YELLOW (A) 12/07/2021 1254   APPEARANCEUR CLOUDY (A) 12/07/2021 1254   LABSPEC 1.009 12/07/2021 1254   PHURINE 5.0 12/07/2021 1254   GLUCOSEU NEGATIVE 12/07/2021 1254   HGBUR NEGATIVE 12/07/2021 1254   BILIRUBINUR NEGATIVE 12/07/2021 1254   KETONESUR NEGATIVE 12/07/2021 1254   PROTEINUR NEGATIVE 12/07/2021 1254   NITRITE NEGATIVE 12/07/2021 1254   LEUKOCYTESUR LARGE (A) 12/07/2021 1254    Radiological  Exams on Admission: CT CHEST ABDOMEN PELVIS WO CONTRAST  Result Date: 12/07/2021 CLINICAL DATA:  Abdominal pain, acute nonlocalized. EXAM: CT CHEST, ABDOMEN AND PELVIS WITHOUT CONTRAST TECHNIQUE: Multidetector CT imaging of the chest, abdomen and pelvis was performed following the standard protocol without IV contrast. RADIATION DOSE REDUCTION: This exam was performed according to the departmental dose-optimization program which includes automated exposure control, adjustment of the mA and/or kV according to patient size and/or use of iterative reconstruction technique. COMPARISON:  CT examination dated July 15, 2016 FINDINGS: CT CHEST FINDINGS Cardiovascular: No significant vascular findings. Normal heart size. No pericardial effusion. Scattered atherosclerotic calcification of aorta. Mediastinum/Nodes: No enlarged mediastinal, hilar, or axillary lymph nodes. Thyroid gland, trachea, and esophagus demonstrate no significant findings. Lungs/Pleura: Consolidation and adjacent ground-glass attenuation in the dependent portion of the right lower lobe concerning for infectious/inflammatory process, has slightly  increased in size from prior CT examination of 2018, likely a chronic process. Lungs otherwise clear. No pleural effusion or pneumothorax. Musculoskeletal: No chest wall mass or suspicious bone lesions identified. CT ABDOMEN PELVIS FINDINGS Hepatobiliary: Hepatic cysts near the porta hepatis is unchanged. Status post cholecystectomy. No biliary ductal dilatation. Pancreas: Mild pancreatic atrophy. No pancreatic ductal dilatation or surrounding inflammatory changes. Spleen: Normal in size without focal abnormality. There is a 5.7 x 3.9 cm mass about the medial aspect of the spleen which is appears to be separate from the adrenal gland. This mass has not significantly increased in size since prior examination, previously measured 5.4 x 3.5 cm. Adrenals/Urinary Tract: Adrenal glands are unremarkable. Kidneys are normal, without renal calculi, focal lesion, or hydronephrosis. Bladder is unremarkable. Stomach/Bowel: Stomach is within normal limits. Appendix appears normal. Colonic diverticulosis. Mild inflammatory changes about the diverticulum in the cecum, which may suggest mild diverticulitis. No extraluminal free air or abdominal collection. Vascular/Lymphatic: Moderate aortic atherosclerosis. No enlarged abdominal or pelvic lymph nodes. Reproductive: Status post hysterectomy. Complex calcified right adnexal structure is unchanged. Other: No abdominal wall hernia or abnormality. No abdominopelvic ascites. Musculoskeletal: Mild multilevel degenerative disease of the thoracolumbar spine. No acute osseous abnormality. IMPRESSION: 1. Small consolidation in the dependent portion of the right lower lobe with adjacent ground-glass attenuation, this process was present on prior CT examination of 2018 and was partially imaged. This likely represent chronic ongoing infectious/inflammatory process. The remaining right lung and left lung are clear. No pleural effusion. 2. Stable left upper quadrant mass adjacent to the spleen  and left adrenal, measuring approximately 5.7 x 3.9 cm, previously measured 5.4 x 3.5 cm, has not significantly changed in size, it likely represent hemangioma or other benign process. 3. Colonic diverticulosis with mild inflammatory changes around the diverticulum in the cecum it may represent mild diverticulitis. 4.  Partially calcified right adnexal structure is unchanged. 5.  Aortic atherosclerotic calcifications. Electronically Signed   By: Larose Hires D.O.   On: 12/07/2021 15:15   CT HEAD WO CONTRAST ( )  Result Date: 12/07/2021 CLINICAL DATA:  Weakness.  Status post fall. EXAM: CT HEAD WITHOUT CONTRAST CT CERVICAL SPINE WITHOUT CONTRAST TECHNIQUE: Multidetector CT imaging of the head and cervical spine was performed following the standard protocol without intravenous contrast. Multiplanar CT image reconstructions of the cervical spine were also generated. RADIATION DOSE REDUCTION: This exam was performed according to the departmental dose-optimization program which includes automated exposure control, adjustment of the mA and/or kV according to patient size and/or use of iterative reconstruction technique. COMPARISON:  CT head and cervical spine 11/26/2021 FINDINGS: CT HEAD FINDINGS Brain: No  evidence of acute infarction, hemorrhage, hydrocephalus, extra-axial collection or mass lesion/mass effect. Small residual subdural fluid collection overlying the left temporal lobe measures 4 by 1.3 cm, image 26/5. On the previous exam this measured 4 x 2.9 cm. Findings are compatible with residual subacute to chronic subdural hematoma. No hyperdense subdural fluid collections to suggest acute subdural hematoma. There is moderate diffuse low-attenuation within the subcortical and periventricular white matter compatible with chronic microvascular disease. Prominence of the sulci and the ventricles compatible with brain atrophy. Vascular: No hyperdense vessel or unexpected calcification. Skull: Normal. Negative for  fracture or focal lesion. Sinuses/Orbits: Paranasal sinuses and mastoid air cells are clear. Other: None. CT CERVICAL SPINE FINDINGS Alignment: Stepwise anterolisthesis of C4 on C5 and C5 on C6 is identified measuring up to 3 mm. This most likely reflects sequelae of chronic spondylosis. No signs of acute posttraumatic malalignment. Skull base and vertebrae: No acute fracture. No primary bone lesion or focal pathologic process. Soft tissues and spinal canal: No prevertebral fluid or swelling. No visible canal hematoma. Disc levels: Multilevel disc space narrowing and endplate spurring is identified. This is most advanced at C6-7. Upper chest: Negative. Other: None IMPRESSION: 1. No acute intracranial abnormality. 2. Small residual subacute to chronic subdural hematoma overlying the left temporal lobe has decreased in size from the previous exam (11/26/2021). 3. Chronic small vessel ischemic change and brain atrophy. 4. No evidence for cervical spine fracture or subluxation. 5. Advanced cervical spondylosis with anterolisthesis of C4 on C5 and C5 on C6. Electronically Signed   By: Signa Kell M.D.   On: 12/07/2021 14:47   CT Cervical Spine Wo Contrast  Result Date: 12/07/2021 CLINICAL DATA:  Weakness.  Status post fall. EXAM: CT HEAD WITHOUT CONTRAST CT CERVICAL SPINE WITHOUT CONTRAST TECHNIQUE: Multidetector CT imaging of the head and cervical spine was performed following the standard protocol without intravenous contrast. Multiplanar CT image reconstructions of the cervical spine were also generated. RADIATION DOSE REDUCTION: This exam was performed according to the departmental dose-optimization program which includes automated exposure control, adjustment of the mA and/or kV according to patient size and/or use of iterative reconstruction technique. COMPARISON:  CT head and cervical spine 11/26/2021 FINDINGS: CT HEAD FINDINGS Brain: No evidence of acute infarction, hemorrhage, hydrocephalus, extra-axial  collection or mass lesion/mass effect. Small residual subdural fluid collection overlying the left temporal lobe measures 4 by 1.3 cm, image 26/5. On the previous exam this measured 4 x 2.9 cm. Findings are compatible with residual subacute to chronic subdural hematoma. No hyperdense subdural fluid collections to suggest acute subdural hematoma. There is moderate diffuse low-attenuation within the subcortical and periventricular white matter compatible with chronic microvascular disease. Prominence of the sulci and the ventricles compatible with brain atrophy. Vascular: No hyperdense vessel or unexpected calcification. Skull: Normal. Negative for fracture or focal lesion. Sinuses/Orbits: Paranasal sinuses and mastoid air cells are clear. Other: None. CT CERVICAL SPINE FINDINGS Alignment: Stepwise anterolisthesis of C4 on C5 and C5 on C6 is identified measuring up to 3 mm. This most likely reflects sequelae of chronic spondylosis. No signs of acute posttraumatic malalignment. Skull base and vertebrae: No acute fracture. No primary bone lesion or focal pathologic process. Soft tissues and spinal canal: No prevertebral fluid or swelling. No visible canal hematoma. Disc levels: Multilevel disc space narrowing and endplate spurring is identified. This is most advanced at C6-7. Upper chest: Negative. Other: None IMPRESSION: 1. No acute intracranial abnormality. 2. Small residual subacute to chronic subdural hematoma overlying the left  temporal lobe has decreased in size from the previous exam (11/26/2021). 3. Chronic small vessel ischemic change and brain atrophy. 4. No evidence for cervical spine fracture or subluxation. 5. Advanced cervical spondylosis with anterolisthesis of C4 on C5 and C5 on C6. Electronically Signed   By: Signa Kell M.D.   On: 12/07/2021 14:47   DG Chest Portable 1 View  Result Date: 12/07/2021 CLINICAL DATA:  Rule out pneumonia. EXAM: PORTABLE CHEST 1 VIEW COMPARISON:  Chest radiograph  dated November 26, 2021 FINDINGS: The heart size and mediastinal contours are within normal limits. Atherosclerotic calcification of the aortic arch. Low lung volumes with vascular crowding. No definite focal consolidation or appreciable pleural effusion. The visualized skeletal structures are unremarkable. IMPRESSION: Low lung volumes with vascular crowding. No definite focal consolidation or pleural effusion. Follow-up examination with improved inspiratory effort and lateral view would be helpful if symptoms persist. Electronically Signed   By: Larose Hires D.O.   On: 12/07/2021 13:55    EKG: Independently reviewed.  Sinus rhythm, no acute ST changes, although computer reported as a flutter with protuberant transduction, review of the EKG showed clearly P waves on multiple leads.  Assessment/Plan Principal Problem:   Generalized weakness Active Problems:   UTI (urinary tract infection)  (please populate well all problems here in Problem List. (For example, if patient is on BP meds at home and you resume or decide to hold them, it is a problem that needs to be her. Same for CAD, COPD, HLD and so on)  Generalized weakness and fall -Probably related to new onset of UTI -Treat UTI -PT evaluation -Clinically, and neurologically there is no focal deficit, code stroke canceled by neurology, and the medical team agreed. -Check orthostatic vitals  UTI -Ceftriaxone and urine culture  AKI -Clinically appears to be euvolemic -Denies any decreased oral intake, and CT abdomen pelvis showed normal kidney and ureter morphology no obstructions.  Agreed with IV fluids for 1 day then repeat kidney function. -Hold off home BP meds, start as needed hydralazine  PAF -Sinus rhythm, although computer reported as a flutter with protuberant transduction, review of the EKG showed clearly P waves on multiple leads. -Not on systemic anticoagulations, and given the recent intracranial bleed after the fall,  anticoagulation is contraindicated. Patient also allergy to ASA.  HTN -As above  DVT prophylaxis: Heparin subcu Code Status: Full code Family Communication: Daughter at bedside Disposition Plan: Expect less than 2 midnight hospital stay Consults called: None Admission status: MedSurg observation   Emeline General MD [facility] Pager [phone]  12/07/2021, 4:06 PM

## 2021-12-08 ENCOUNTER — Observation Stay
Admit: 2021-12-08 | Discharge: 2021-12-08 | Disposition: A | Discharge status: Home / Self Care | Payer: Medicare Other | Attending: Internal Medicine | Admitting: Internal Medicine

## 2021-12-08 DIAGNOSIS — I10 Essential (primary) hypertension: Secondary | ICD-10-CM | POA: Diagnosis present

## 2021-12-08 DIAGNOSIS — Z886 Allergy status to analgesic agent status: Secondary | ICD-10-CM | POA: Diagnosis not present

## 2021-12-08 DIAGNOSIS — Z9049 Acquired absence of other specified parts of digestive tract: Secondary | ICD-10-CM | POA: Diagnosis not present

## 2021-12-08 DIAGNOSIS — I480 Paroxysmal atrial fibrillation: Secondary | ICD-10-CM | POA: Diagnosis present

## 2021-12-08 DIAGNOSIS — R531 Weakness: Secondary | ICD-10-CM | POA: Diagnosis present

## 2021-12-08 DIAGNOSIS — W19XXXA Unspecified fall, initial encounter: Secondary | ICD-10-CM | POA: Diagnosis present

## 2021-12-08 DIAGNOSIS — A419 Sepsis, unspecified organism: Secondary | ICD-10-CM | POA: Diagnosis present

## 2021-12-08 DIAGNOSIS — Z83438 Family history of other disorder of lipoprotein metabolism and other lipidemia: Secondary | ICD-10-CM | POA: Diagnosis not present

## 2021-12-08 DIAGNOSIS — I2510 Atherosclerotic heart disease of native coronary artery without angina pectoris: Secondary | ICD-10-CM | POA: Diagnosis present

## 2021-12-08 DIAGNOSIS — E785 Hyperlipidemia, unspecified: Secondary | ICD-10-CM | POA: Diagnosis present

## 2021-12-08 DIAGNOSIS — R778 Other specified abnormalities of plasma proteins: Secondary | ICD-10-CM | POA: Diagnosis present

## 2021-12-08 DIAGNOSIS — F419 Anxiety disorder, unspecified: Secondary | ICD-10-CM | POA: Diagnosis present

## 2021-12-08 DIAGNOSIS — M797 Fibromyalgia: Secondary | ICD-10-CM | POA: Diagnosis present

## 2021-12-08 DIAGNOSIS — H3530 Unspecified macular degeneration: Secondary | ICD-10-CM | POA: Diagnosis present

## 2021-12-08 DIAGNOSIS — N179 Acute kidney failure, unspecified: Secondary | ICD-10-CM | POA: Diagnosis present

## 2021-12-08 DIAGNOSIS — Z8249 Family history of ischemic heart disease and other diseases of the circulatory system: Secondary | ICD-10-CM | POA: Diagnosis not present

## 2021-12-08 DIAGNOSIS — Z882 Allergy status to sulfonamides status: Secondary | ICD-10-CM | POA: Diagnosis not present

## 2021-12-08 DIAGNOSIS — Z818 Family history of other mental and behavioral disorders: Secondary | ICD-10-CM | POA: Diagnosis not present

## 2021-12-08 DIAGNOSIS — K5792 Diverticulitis of intestine, part unspecified, without perforation or abscess without bleeding: Secondary | ICD-10-CM | POA: Diagnosis present

## 2021-12-08 DIAGNOSIS — G629 Polyneuropathy, unspecified: Secondary | ICD-10-CM | POA: Diagnosis present

## 2021-12-08 DIAGNOSIS — N390 Urinary tract infection, site not specified: Secondary | ICD-10-CM | POA: Diagnosis present

## 2021-12-08 LAB — CBC
HCT: 35.2 % — ABNORMAL LOW (ref 36.0–46.0)
Hemoglobin: 11.7 g/dL — ABNORMAL LOW (ref 12.0–15.0)
MCH: 30.4 pg (ref 26.0–34.0)
MCHC: 33.2 g/dL (ref 30.0–36.0)
MCV: 91.4 fL (ref 80.0–100.0)
Platelets: 182 10*3/uL (ref 150–400)
RBC: 3.85 MIL/uL — ABNORMAL LOW (ref 3.87–5.11)
RDW: 13.1 % (ref 11.5–15.5)
WBC: 26.2 10*3/uL — ABNORMAL HIGH (ref 4.0–10.5)
nRBC: 0 % (ref 0.0–0.2)

## 2021-12-08 LAB — BASIC METABOLIC PANEL
Anion gap: 4 — ABNORMAL LOW (ref 5–15)
BUN: 27 mg/dL — ABNORMAL HIGH (ref 8–23)
CO2: 21 mmol/L — ABNORMAL LOW (ref 22–32)
Calcium: 8.3 mg/dL — ABNORMAL LOW (ref 8.9–10.3)
Chloride: 107 mmol/L (ref 98–111)
Creatinine, Ser: 1.14 mg/dL — ABNORMAL HIGH (ref 0.44–1.00)
GFR, Estimated: 46 mL/min — ABNORMAL LOW (ref >60)
Glucose, Bld: 102 mg/dL — ABNORMAL HIGH (ref 70–99)
Potassium: 4.2 mmol/L (ref 3.5–5.1)
Sodium: 132 mmol/L — ABNORMAL LOW (ref 135–145)

## 2021-12-08 LAB — LACTIC ACID, PLASMA: Lactic Acid, Venous: 1 mmol/L (ref 0.5–1.9)

## 2021-12-08 MED ORDER — ALUM & MAG HYDROXIDE-SIMETH 200-200-20 MG/5ML PO SUSP
30.0000 mL | Freq: Four times a day (QID) | ORAL | Status: DC | PRN
  Administered 2021-12-08: 30 mL via ORAL
  Filled 2021-12-08: qty 30

## 2021-12-08 MED ORDER — ACETAMINOPHEN 325 MG PO TABS
650.0000 mg | ORAL_TABLET | Freq: Once | ORAL | Status: AC
  Administered 2021-12-08: 650 mg via ORAL
  Filled 2021-12-08: qty 2

## 2021-12-08 MED ORDER — MECLIZINE HCL 25 MG PO TABS
12.5000 mg | ORAL_TABLET | Freq: Three times a day (TID) | ORAL | Status: DC | PRN
  Administered 2021-12-08: 12.5 mg via ORAL
  Filled 2021-12-08 (×2): qty 0.5

## 2021-12-08 MED ORDER — SODIUM CHLORIDE 0.9 % IV SOLN: INTRAVENOUS | Status: DC

## 2021-12-08 MED ORDER — ONDANSETRON HCL 4 MG/2ML IJ SOLN
4.0000 mg | Freq: Once | INTRAMUSCULAR | Status: AC
  Administered 2021-12-08: 4 mg via INTRAVENOUS
  Filled 2021-12-08: qty 2

## 2021-12-08 NOTE — Progress Notes (Addendum)
PROGRESS NOTE  Eileen Walsh  DOB: 1929/05/20  PCP: [name]., MD MVH:[id number]  DOA: 12/07/2021  LOS: 0 days  Hospital Day: 2  Brief narrative: Eileen Walsh is a 88 y.o. female with PMH significant for PAF not on systemic anticoagulation, HTN, HLD, GERD, CAD, macular degeneration, recent subdural hematoma November 26, 2021 Patient presented to ED on 6/25 from home with complaint of progressively worsening bilateral lower extremity weakness as well as generalized weakness for few days, leading to a fall.  She noticed cloudy urine but denies any dysuria or urinary frequency.  In the ED, patient was afebrile and hemodynamically stable Labs showed creatinine elevated to 1.8, troponin elevated 496, WBC count elevated to 38.7, initial lactic acid elevated to 4.7 Urinalysis with cloudy yellow urine with large leukocytes, many bacteria. Initially code stroke was triggered and neurology evaluated the patient.  CT head with unchanged previously noted subdural hematoma and no other acute findings.  Low suspicion of stroke and hence no further stroke work-up was pursued. Admitted to hospitalist service.  Subjective: Patient was seen and examined this morning.  Pleasant elderly Caucasian female.  Sitting up in chair.  Not in distress.  Feels sad because of recurrent hospitalization.  She apparently does not consume enough water at home because of the anticipated need to get up to the bathroom.  Son at bedside. Since admission, patient has had 4 episodes of loose bowel movement.  No diarrhea prior to presentation. Chart reviewed. Hemodynamically stable.  Labs this morning with creatinine better at 1.14, sodium low at 132, troponin trending down at 357, lactic acid normalized, WBC count down to 26.2  Principal Problem:   Generalized weakness Active Problems:   UTI (urinary tract infection)    Assessment and plan: Sepsis secondary to UTI -Presented with progressive generalized  weakness and fall  -Significantly elevated WBC count, lactic acid and elevated creatinine with stable blood pressure. -urinalysis with cloudy yellow urine and large leukocytes and many bacteria -Pending urine culture report. -Currently on IV Rocephin -Improving WBC count.  Lactic acid level normalized -Repeat labs in the morning Recent Labs  Lab 12/07/21 1245 12/07/21 1308 12/07/21 1700 12/08/21 0250  WBC 38.7*  --   --  26.2*  LATICACIDVEN  --  4.7* 3.3* 1.0   Generalized weakness and fall -Probably related to new onset of UTI.  Expect improvement with antibiotics -PT evaluation -Check orthostatic vitals  Diarrhea -For episode of loose bowel movement since admission.  Did not have diarrhea prior to that.  Son at bedside believes this is anxiety related.  Continue to monitor on IV fluid.Marland Kitchen   AKI -Clinically appears to be euvolemic -Creatinine elevated to 1.8 on admission and is improving.   -Continue to monitor -Avoid NSAIDs or any nephrotoxic agent for now Recent Labs    01/14/21 0948 07/02/21 1339 07/15/21 1400 11/26/21 1220 12/07/21 1245 12/08/21 0250  BUN 19 16 18 23  26* 27*  CREATININE 1.10* 0.83 1.01* 0.84 1.80* 1.14*   Essential hypertension -PTA on amlodipine 5 mg daily, olmesartan 40 mg daily -Continue to hold blood pressure medicines for now.  Obtain orthostatic vital signs.  May benefit from meclizine as needed at discharge.   Paroxysmal A-fib -Not on any AV nodal blocking agent -Not on anticoagulation because of recent intracranial bleed.    Goals of care   Code Status: Full Code    Mobility: Encourage ambulation.  PT eval ordered  Skin assessment:     Nutritional status:  Body mass index is 26.59 kg/m.          Diet:  Diet Order             Diet renal with fluid restriction Fluid restriction: 1200 mL Fluid; Room service appropriate? Yes; Fluid consistency: Thin  Diet effective now                   DVT prophylaxis:  heparin  injection 5,000 Units Start: 12/07/21 2200   Antimicrobials: IV Rocephin Fluid: NS at 75 mill per hour Consultants: None Family Communication: Son at bedside  Status is: Observation  Continue in-hospital care because: Pending urine culture report.  Continues to need antibiotics Level of care: Med-Surg   Dispo: The patient is from: Home              Anticipated d/c is to: Hopefully home in 1 to 2 days              Patient currently is not medically stable to d/c.   Difficult to place patient No     Infusions:   sodium chloride 75 mL/hr at 12/08/21 1101   cefTRIAXone (ROCEPHIN)  IV 1 g (12/08/21 1100)    Scheduled Meds:  heparin  5,000 Units Subcutaneous Q12H   hydrocortisone cream   Topical BID   ketoconazole   Topical Daily   pantoprazole  40 mg Oral Daily    PRN meds: clobetasol, hydrALAZINE, mouth rinse   Antimicrobials: Anti-infectives (From admission, onward)    Start     Dose/Rate Route Frequency Ordered Stop   12/08/21 1000  cefTRIAXone (ROCEPHIN) 1 g in sodium chloride 0.9 % 100 mL IVPB        1 g 200 mL/hr over 30 Minutes Intravenous Every 24 hours 12/07/21 1554     12/07/21 1345  ceFEPIme (MAXIPIME) 2 g in sodium chloride 0.9 % 100 mL IVPB        2 g 200 mL/hr over 30 Minutes Intravenous  Once 12/07/21 1338 12/07/21 1500   12/07/21 1345  metroNIDAZOLE (FLAGYL) IVPB 500 mg        500 mg 100 mL/hr over 60 Minutes Intravenous  Once 12/07/21 1338 12/07/21 1625   12/07/21 1345  vancomycin (VANCOCIN) IVPB 1000 mg/200 mL premix  Status:  Discontinued        1,000 mg 200 mL/hr over 60 Minutes Intravenous  Once 12/07/21 1338 12/07/21 1659       Objective: Vitals:   12/08/21 0340 12/08/21 0836  BP: 122/63 (!) 138/49  Pulse: 64 80  Resp: 18 18  Temp: 98.5 F (36.9 C) 97.7 F (36.5 C)  SpO2: 98% 98%    Intake/Output Summary (Last 24 hours) at 12/08/2021 1143 Last data filed at 12/08/2021 0115 Gross per 24 hour  Intake 4320 ml  Output --  Net 4320 ml    Filed Weights   12/07/21 1247  Weight: 77 kg   Weight change:  Body mass index is 26.59 kg/m.   Physical Exam: General exam: Pleasant, elderly Caucasian female.  Not in physical distress Skin: No rashes, lesions or ulcers. HEENT: Atraumatic, normocephalic, no obvious bleeding Lungs: Clear to auscultation bilaterally CVS: Regular rate and rhythm, mild systolic ejection murmur GI/Abd soft, nontender, nondistended, bowel sound present CNS: Alert, awake, oriented x3 Psychiatry: Mood appropriate Extremities: No pedal edema, no calf tenderness  Data Review: I have personally reviewed the laboratory data and studies available.  F/u labs ordered Unresulted Labs (From admission, onward)  Start     Ordered   12/09/21 0500  CBC with Differential/Platelet  Tomorrow morning,   R        12/08/21 0550   12/09/21 0500  Comprehensive metabolic panel  Tomorrow morning,   R        12/08/21 0550   12/09/21 0500  Magnesium  Tomorrow morning,   R        12/08/21 0550   12/09/21 0500  Phosphorus  Tomorrow morning,   R        12/08/21 0550   12/07/21 1312  Urine Culture  Once,   URGENT       Question:  Indication  Answer:  Dysuria   12/07/21 1311            Signed, Lorin Glass, MD [facility] 12/08/2021

## 2021-12-08 NOTE — Evaluation (Signed)
Occupational Therapy Evaluation Patient Details Name: Eileen Walsh MRN: [id number] DOB: [birth year] Today's Date: 12/08/2021   History of Present Illness Pt is a 88 y.o. female with medical history significant of Paroxysmal A-Fib not on systemic anticoagulation, HTN, HLD, GERD, CAD, macular degeneration, recent subdural hematoma November 26, 2021, presented to ED on 12/07/21 with worsening of generalized weakness and fall. Suspected UTI.   Clinical Impression   Pt was seen for OT evaluation this date. Prior to hospital admission and recent fall, pt was independent with short distance driving, meal prep, med mgt, and basic ADL. She reports having paid assistance for cleaning and her children are available for emergencies only. Pt uses a SPC at baseline, and endorses a few recent falls due to LOB. Currently pt demonstrates impairments as described below (See OT problem list) which functionally limit her ability to perform ADL/self-care tasks. Pt currently requires supervision for bed mobility and lateral scoot transfers EOB. She declines OOB 2/2 dizziness she refers to as "vertigo." Pt completed grooming tasks seated EOB with set up and supervision. No LOB. Pt educated in home/routines modifications and strategies for improved overnight bladder mgt. Pt verbalized understanding. Pt would benefit from skilled OT services to address noted impairments and functional limitations (see below for any additional details) in order to maximize safety and independence while minimizing falls risk and caregiver burden. Upon hospital discharge, recommend STR to maximize pt safety and return to PLOF.     Recommendations for follow up therapy are one component of a multi-disciplinary discharge planning process, led by the attending physician.  Recommendations may be updated based on patient status, additional functional criteria and insurance authorization.   Follow Up Recommendations  Skilled nursing-short term rehab (<3  hours/day)    Assistance Recommended at Discharge Frequent or constant Supervision/Assistance  Patient can return home with the following A little help with bathing/dressing/bathroom;Assistance with cooking/housework;Assist for transportation;A lot of help with walking and/or transfers;Direct supervision/assist for medications management;Help with stairs or ramp for entrance    Functional Status Assessment  Patient has had a recent decline in their functional status and demonstrates the ability to make significant improvements in function in a reasonable and predictable amount of time.  Equipment Recommendations  BSC/3in1    Recommendations for Other Services       Precautions / Restrictions Precautions Precautions: Fall Restrictions Weight Bearing Restrictions: No      Mobility Bed Mobility Overal bed mobility: Needs Assistance Bed Mobility: Supine to Sit, Sit to Supine     Supine to sit: Supervision, HOB elevated Sit to supine: Supervision   General bed mobility comments: increased time/effort    Transfers Overall transfer level: Needs assistance   Transfers: Bed to chair/wheelchair/BSC            Lateral/Scoot Transfers: Supervision General transfer comment: pt declined to stand, as she recently got back to bed from recliner. Agreeable to lateral scoots to improve positioning prior to return to bed      Balance Overall balance assessment: Needs assistance Sitting-balance support: Feet supported, No upper extremity supported Sitting balance-Leahy Scale: Fair Sitting balance - Comments: fair static sitting balance EOB for seated grooming tasks, no LOB                                   ADL either performed or assessed with clinical judgement   ADL  General ADL Comments: Pt currently requires set up and supervision for seated grooming tasks EOB. Declined OOB. Anticipate MIN A for LB ADL  tasks 2/2 weakness and impaired balance. Pt also notes some mild dizziness throughout.     Vision         Perception     Praxis      Pertinent Vitals/Pain Pain Assessment Pain Assessment:  (endorses mild stomach pain but does not rate)     Hand Dominance Right   Extremity/Trunk Assessment Upper Extremity Assessment Upper Extremity Assessment: Overall WFL for tasks assessed   Lower Extremity Assessment Lower Extremity Assessment: Generalized weakness       Communication Communication Communication: No difficulties   Cognition Arousal/Alertness: Awake/alert Behavior During Therapy: WFL for tasks assessed/performed Overall Cognitive Status: No family/caregiver present to determine baseline cognitive functioning                                 General Comments: Pt alert and agreeable, follows commands well     General Comments       Exercises Other Exercises Other Exercises: Pt instructed in bladder mgt strategies and home/routines modifications to maximize safety/indep with overnight toileting needs.   Shoulder Instructions      Home Living Family/patient expects to be discharged to:: Private residence Living Arrangements: Alone Available Help at Discharge: Family;Available PRN/intermittently Type of Home: House Home Access: Stairs to enter [employer] of Steps: 5 Entrance Stairs-Rails: Can reach both Home Layout: One level     Bathroom Shower/Tub: Producer, television/film/video: Handicapped height     Home Equipment: Rollator (4 wheels);Cane - single point;Shower seat;Hand held shower head;Grab bars - toilet;Grab bars - tub/shower;Toilet riser          Prior Functioning/Environment Prior Level of Function : Independent/Modified Independent;Driving;History of Falls (last six months)             Mobility Comments: uses cane at baseline; pt reported at least 2 falls in the past 6 months with self-reported cause being  vertigo (later told OT she fell due to a loss of balance) ADLs Comments: pt reported independence with bathing, dressing, feeding, driving        OT Problem List: Decreased strength;Decreased safety awareness;Decreased activity tolerance;Impaired balance (sitting and/or standing);Decreased knowledge of use of DME or AE      OT Treatment/Interventions: Self-care/ADL training;Therapeutic exercise;Therapeutic activities;Energy conservation;DME and/or AE instruction;Balance training;Patient/family education    OT Goals(Current goals can be found in the care plan section) Acute Rehab OT Goals Patient Stated Goal: get better and go home OT Goal Formulation: With patient Time For Goal Achievement: 12/22/21 Potential to Achieve Goals: Good ADL Goals Pt Will Perform Lower Body Dressing: with set-up;with supervision;sit to/from stand Pt Will Transfer to Toilet: with supervision;ambulating (LRAD) Pt Will Perform Toileting - Clothing Manipulation and hygiene: with modified independence;sitting/lateral leans Additional ADL Goal #1: Pt will verbalize plan to implement at least 2 learned falls prevention strategies to maximize safety and minimize falls risk with ADL/IADL.  OT Frequency: Min 2X/week    Co-evaluation              AM-PAC OT "6 Clicks" Daily Activity     Outcome Measure Help from another person eating meals?: None Help from another person taking care of personal grooming?: A Little Help from another person toileting, which includes using toliet, bedpan, or urinal?: A Little Help from another person bathing (including  washing, rinsing, drying)?: A Little Help from another person to put on and taking off regular upper body clothing?: A Little Help from another person to put on and taking off regular lower body clothing?: A Little 6 Click Score: 19   End of Session    Activity Tolerance: Patient tolerated treatment well Patient left: in bed;with call bell/phone within reach;with  bed alarm set  OT Visit Diagnosis: Other abnormalities of gait and mobility (R26.89);Muscle weakness (generalized) (M62.81);History of falling (Z91.81)                Time: 1610-9604 OT Time Calculation (min): 20 min Charges:  OT General Charges $OT Visit: 1 Visit OT Evaluation $OT Eval Low Complexity: 1 Low OT Treatments $Self Care/Home Management : 8-22 mins  Arman Filter., MPH, MS, OTR/L ascom [phone] 12/08/21, 2:11 PM

## 2021-12-09 DIAGNOSIS — R531 Weakness: Secondary | ICD-10-CM | POA: Diagnosis not present

## 2021-12-09 LAB — COMPREHENSIVE METABOLIC PANEL
ALT: 19 U/L (ref 0–44)
AST: 28 U/L (ref 15–41)
Albumin: 3.1 g/dL — ABNORMAL LOW (ref 3.5–5.0)
Alkaline Phosphatase: 62 U/L (ref 38–126)
Anion gap: 6 (ref 5–15)
BUN: 15 mg/dL (ref 8–23)
CO2: 22 mmol/L (ref 22–32)
Calcium: 8.6 mg/dL — ABNORMAL LOW (ref 8.9–10.3)
Chloride: 110 mmol/L (ref 98–111)
Creatinine, Ser: 0.74 mg/dL (ref 0.44–1.00)
GFR, Estimated: >60 mL/min (ref >60)
Glucose, Bld: 117 mg/dL — ABNORMAL HIGH (ref 70–99)
Potassium: 3.7 mmol/L (ref 3.5–5.1)
Sodium: 138 mmol/L (ref 135–145)
Total Bilirubin: 0.8 mg/dL (ref 0.3–1.2)
Total Protein: 6.5 g/dL (ref 6.5–8.1)

## 2021-12-09 LAB — CBC WITH DIFFERENTIAL/PLATELET
Abs Immature Granulocytes: 0.13 10*3/uL — ABNORMAL HIGH (ref 0.00–0.07)
Basophils Absolute: 0.1 10*3/uL (ref 0.0–0.1)
Basophils Relative: 1 %
Eosinophils Absolute: 0.4 10*3/uL (ref 0.0–0.5)
Eosinophils Relative: 2 %
HCT: 37.7 % (ref 36.0–46.0)
Hemoglobin: 12.7 g/dL (ref 12.0–15.0)
Immature Granulocytes: 1 %
Lymphocytes Relative: 11 %
Lymphs Abs: 1.9 10*3/uL (ref 0.7–4.0)
MCH: 30.8 pg (ref 26.0–34.0)
MCHC: 33.7 g/dL (ref 30.0–36.0)
MCV: 91.3 fL (ref 80.0–100.0)
Monocytes Absolute: 1.2 10*3/uL — ABNORMAL HIGH (ref 0.1–1.0)
Monocytes Relative: 7 %
Neutro Abs: 13.9 10*3/uL — ABNORMAL HIGH (ref 1.7–7.7)
Neutrophils Relative %: 78 %
Platelets: 198 10*3/uL (ref 150–400)
RBC: 4.13 MIL/uL (ref 3.87–5.11)
RDW: 13 % (ref 11.5–15.5)
WBC: 17.6 10*3/uL — ABNORMAL HIGH (ref 4.0–10.5)
nRBC: 0 % (ref 0.0–0.2)

## 2021-12-09 LAB — ECHOCARDIOGRAM COMPLETE
Area-P 1/2: 3.46 cm2
Calc EF: 68.9 %
Height: 67 in
S' Lateral: 2.5 cm
Single Plane A2C EF: 70.9 %
Single Plane A4C EF: 66.4 %
Weight: 2716.07 oz

## 2021-12-09 LAB — URINE CULTURE: Culture: >=100000 — AB

## 2021-12-09 LAB — MAGNESIUM: Magnesium: 1.8 mg/dL (ref 1.7–2.4)

## 2021-12-09 LAB — PHOSPHORUS: Phosphorus: 2.5 mg/dL (ref 2.5–4.6)

## 2021-12-09 MED ORDER — METRONIDAZOLE 500 MG/100ML IV SOLN
500.0000 mg | Freq: Two times a day (BID) | INTRAVENOUS | Status: DC
  Administered 2021-12-09: 500 mg via INTRAVENOUS
  Filled 2021-12-09: qty 100

## 2021-12-09 MED ORDER — PHENAZOPYRIDINE HCL 100 MG PO TABS
100.0000 mg | ORAL_TABLET | Freq: Three times a day (TID) | ORAL | Status: DC
Start: 2021-12-09 — End: 2021-12-10
  Administered 2021-12-09 – 2021-12-10 (×4): 100 mg via ORAL
  Filled 2021-12-09 (×5): qty 1

## 2021-12-09 MED ORDER — ACETAMINOPHEN 500 MG PO TABS
500.0000 mg | ORAL_TABLET | Freq: Four times a day (QID) | ORAL | Status: DC
Start: 2021-12-09 — End: 2021-12-10
  Administered 2021-12-09 – 2021-12-10 (×5): 500 mg via ORAL
  Filled 2021-12-09 (×5): qty 1

## 2021-12-09 MED ORDER — SODIUM CHLORIDE 0.9 % IV SOLN
3.0000 g | Freq: Four times a day (QID) | INTRAVENOUS | Status: DC
  Administered 2021-12-09 – 2021-12-10 (×4): 3 g via INTRAVENOUS
  Filled 2021-12-09: qty 8
  Filled 2021-12-09: qty 3
  Filled 2021-12-09: qty 8
  Filled 2021-12-09 (×3): qty 3

## 2021-12-09 MED ORDER — DULOXETINE HCL 20 MG PO CPEP
20.0000 mg | ORAL_CAPSULE | Freq: Every day | ORAL | Status: DC
  Administered 2021-12-09: 20 mg via ORAL
  Filled 2021-12-09: qty 1

## 2021-12-09 MED ORDER — TRAMADOL HCL 50 MG PO TABS: 50.0000 mg | ORAL_TABLET | Freq: Four times a day (QID) | ORAL | Status: DC | PRN

## 2021-12-09 MED ORDER — PHENAZOPYRIDINE HCL 100 MG PO TABS
100.0000 mg | ORAL_TABLET | Freq: Three times a day (TID) | ORAL | Status: DC
  Filled 2021-12-09: qty 1

## 2021-12-09 MED ORDER — KETOROLAC TROMETHAMINE 30 MG/ML IJ SOLN
15.0000 mg | Freq: Once | INTRAMUSCULAR | Status: AC
  Administered 2021-12-09: 15 mg via INTRAVENOUS
  Filled 2021-12-09: qty 1

## 2021-12-09 MED ORDER — AMLODIPINE BESYLATE 5 MG PO TABS
5.0000 mg | ORAL_TABLET | Freq: Every day | ORAL | Status: DC
  Administered 2021-12-09 – 2021-12-10 (×2): 5 mg via ORAL
  Filled 2021-12-09 (×2): qty 1

## 2021-12-09 MED ORDER — MELATONIN 5 MG PO TABS
5.0000 mg | ORAL_TABLET | Freq: Every day | ORAL | Status: DC
  Administered 2021-12-09: 5 mg via ORAL
  Filled 2021-12-09: qty 1

## 2021-12-09 NOTE — TOC Initial Note (Addendum)
Transition of Care [facility]) - Initial/Assessment Note    Patient Details  Name: Eileen Walsh MRN: [id number] Date of Birth: 1929/01/17  Transition of Care [facility] CM/SW Contact:    Eileen Griffes, LCSW Phone Number: 12/09/2021, 11:43 AM  Clinical Narrative:                  Update: Eileen Walsh preference accepted, patient and her daughter in Social worker [place] updated. CSW started [place] which shows approved in [facility] Portal.  CSW met with patient and daughter in law Eileen Walsh at bedside to discuss SNF recommendations. They report being in agreement with preference for [facility] or [facility] (no to [facility] or [facility]). CSW has sent referrals pending bed offers.   Expected Discharge Plan: Skilled Nursing Facility Barriers to Discharge: Continued Medical Work up   Patient Goals and CMS Choice Patient states their goals for this hospitalization and ongoing recovery are:: to go home CMS Medicare.gov Compare Post Acute Care list provided to:: Patient Choice offered to / list presented to : Patient  Expected Discharge Plan and Services Expected Discharge Plan: Skilled Nursing Facility       Living arrangements for the past 2 months: Single Family Home                                      Prior Living Arrangements/Services Living arrangements for the past 2 months: Single Family Home Lives with:: Self                   Activities of Daily Living Home Assistive Devices/Equipment: Grab bars around toilet, Grab bars in shower ADL Screening (condition at time of admission) Patient's cognitive ability adequate to safely complete daily activities?: Yes Is the patient deaf or have difficulty hearing?: No Does the patient have difficulty seeing, even when wearing glasses/contacts?: No Does the patient have difficulty concentrating, remembering, or making decisions?: No Patient able to express need for assistance with ADLs?: Yes Does the patient have difficulty dressing or  bathing?: No Independently performs ADLs?: No Communication: Independent Dressing (OT): Needs assistance Is this a change from baseline?: Pre-admission baseline Grooming: Independent Feeding: Independent Bathing: Needs assistance Is this a change from baseline?: Pre-admission baseline Toileting: Needs assistance Is this a change from baseline?: Pre-admission baseline In/Out Bed: Needs assistance Is this a change from baseline?: Pre-admission baseline Walks in Home: Independent with device (comment) Does the patient have difficulty walking or climbing stairs?: Yes Weakness of Legs: Both Weakness of Arms/Hands: None  Permission Sought/Granted                  Emotional Assessment       Orientation: : Oriented to Self, Oriented to Place, Oriented to  Time, Oriented to Situation Alcohol / Substance Use: Not Applicable Psych Involvement: No (comment)  Admission diagnosis:  Generalized weakness [R53.1] Urinary tract infection without hematuria, site unspecified [N39.0] Sepsis, due to unspecified organism, unspecified whether acute organ dysfunction present [facility]) [A41.9] UTI (urinary tract infection) [N39.0] Patient Active Problem List   Diagnosis Date Noted   UTI (urinary tract infection) 12/07/2021   Subdural hematoma (HCC) 11/27/2021   SDH (subdural hematoma) (HCC) 11/26/2021   HTN (hypertension) 11/26/2021   Hypertensive urgency 11/26/2021   Syncope 11/26/2021   Generalized weakness 07/02/2021   Vertigo 07/02/2021   AF (paroxysmal atrial fibrillation) (HCC) 07/02/2021   Essential hypertension 01/12/2019   Atrial fibrillation with  RVR (HCC) 07/16/2016   Arthritis 11/05/2014   Back ache 11/05/2014   Clinical depression 11/05/2014   Diabetes (HCC) 11/05/2014   Fibrositis 11/05/2014   Acid reflux 11/05/2014   Blood glucose elevated 11/05/2014   HLD (hyperlipidemia) 11/05/2014   BP (high blood pressure) 11/05/2014   Neuropathy 11/05/2014   Adiposity 11/05/2014    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   Arteriosclerosis of coronary artery 04/06/2014   Essential (primary) hypertension 04/06/2014   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/2014   CAD (coronary artery disease) 04/06/2014   PCP:  [name]., MD Pharmacy:   [id number] [place], [place] - [address] AT [facility] [address] [facility] [address] [address] & [address] [place] [address] Phone: [phone] Fax: [phone]  [facility] - [place], [place] - [address] [facility] [address] [facility] AT Portal to Reg[facility] [address] [place] [address] Phone: [phone] Fax: [phone]  [id number] [place] [facility] - [address] [place] [address] Phone: [phone] Fax: [phone]     Social Determinants of Health (SDOH) Interventions    Readmission Risk Interventions     No data to display

## 2021-12-10 DIAGNOSIS — R531 Weakness: Secondary | ICD-10-CM | POA: Diagnosis not present

## 2021-12-10 MED ORDER — AMOXICILLIN-POT CLAVULANATE 875-125 MG PO TABS
1.0000 | ORAL_TABLET | Freq: Two times a day (BID) | ORAL | Status: DC
  Administered 2021-12-10: 1 via ORAL
  Filled 2021-12-10: qty 1

## 2021-12-10 MED ORDER — ALUM & MAG HYDROXIDE-SIMETH 200-200-20 MG/5ML PO SUSP: 30.0000 mL | Freq: Four times a day (QID) | ORAL | 0 refills | Status: DC | PRN

## 2021-12-10 MED ORDER — AMOXICILLIN-POT CLAVULANATE 875-125 MG PO TABS: 1.0000 | ORAL_TABLET | Freq: Two times a day (BID) | ORAL | Status: AC

## 2021-12-10 MED ORDER — PHENAZOPYRIDINE HCL 100 MG PO TABS: 100.0000 mg | ORAL_TABLET | Freq: Three times a day (TID) | ORAL | 0 refills | Status: AC

## 2021-12-10 MED ORDER — DULOXETINE HCL 20 MG PO CPEP: 20.0000 mg | ORAL_CAPSULE | Freq: Every day | ORAL | 3 refills | Status: DC

## 2021-12-10 MED ORDER — MELATONIN 5 MG PO TABS: 5.0000 mg | ORAL_TABLET | Freq: Every day | ORAL | 0 refills | Status: DC

## 2021-12-10 NOTE — Progress Notes (Signed)
Physical Therapy Treatment Patient Details Name: Eileen Walsh MRN: [id number] DOB: 1929-02-26 Today's Date: 12/10/2021   History of Present Illness Pt is a 88 y.o. female with medical history significant of Paroxysmal A-Fib not on systemic anticoagulation, HTN, HLD, GERD, CAD, macular degeneration, recent subdural hematoma November 26, 2021, presented to ED on 12/07/21 with worsening of generalized weakness and fall. Suspected UTI.    PT Comments    Pt did well with PT despite initially stating she was not interested in getting up or doing any activity.  Ultimately pt did not need a lot of assist but was quick to fatigue with minimal activity and reports feeling like she remains far from her baseline.  Pt will benefit from STR when medically ready for d/c.     Recommendations for follow up therapy are one component of a multi-disciplinary discharge planning process, led by the attending physician.  Recommendations may be updated based on patient status, additional functional criteria and insurance authorization.  Follow Up Recommendations  Skilled nursing-short term rehab (<3 hours/day) Can patient physically be transported by private vehicle: Yes   Assistance Recommended at Discharge Frequent or constant Supervision/Assistance  Patient can return home with the following A little help with walking and/or transfers;A little help with bathing/dressing/bathroom;Assistance with cooking/housework;Assist for transportation   Equipment Recommendations   (pt has walker at home)    Recommendations for Other Services       Precautions / Restrictions Precautions Precautions: Fall Restrictions Weight Bearing Restrictions: No     Mobility  Bed Mobility Overal bed mobility: Needs Assistance Bed Mobility: Supine to Sit     Supine to sit: Supervision, HOB elevated     General bed mobility comments: Pt did well getting to seated, appropriate UE/rail use    Transfers Overall transfer  level: Needs assistance Equipment used: Rolling walker (2 wheels) Transfers: Sit to/from Stand Sit to Stand: Min guard   Step pivot transfers: Min guard       General transfer comment: VC for hand placement and RW management    Ambulation/Gait Ambulation/Gait assistance: Min guard Gait Distance (Feet): 20 Feet Assistive device: Rolling walker (2 wheels)         General Gait Details: Pt with no dizziness/lightheadedness during ambulation, appropriate use of walker with with LOBs.  Reports fatiguing quickly with minimal ambulation, vitals stable t/o.   Stairs             Wheelchair Mobility    Modified Rankin (Stroke Patients Only)       Balance Overall balance assessment: Needs assistance Sitting-balance support: Feet supported, No upper extremity supported Sitting balance-Leahy Scale: Good     Standing balance support: During functional activity, Bilateral upper extremity supported Standing balance-Leahy Scale: Fair Standing balance comment: no overt LOB                            Cognition Arousal/Alertness: Awake/alert Behavior During Therapy: Anxious Overall Cognitive Status: Within Functional Limits for tasks assessed                                          Exercises      General Comments General comments (skin integrity, edema, etc.): Pt initially not interested in working with PT, willing to do some minimal walking with much encouragement.  quick to fatigue  Pertinent Vitals/Pain Pain Assessment Pain Assessment: No/denies pain    Home Living                          Prior Function            PT Goals (current goals can now be found in the care plan section) Progress towards PT goals: Progressing toward goals    Frequency    Min 2X/week      PT Plan Current plan remains appropriate    Co-evaluation              AM-PAC PT "6 Clicks" Mobility   Outcome Measure  Help needed  turning from your back to your side while in a flat bed without using bedrails?: A Little Help needed moving from lying on your back to sitting on the side of a flat bed without using bedrails?: A Little Help needed moving to and from a bed to a chair (including a wheelchair)?: A Little Help needed standing up from a chair using your arms (e.g., wheelchair or bedside chair)?: A Little Help needed to walk in hospital room?: A Lot Help needed climbing 3-5 steps with a railing? : A Lot 6 Click Score: 16    End of Session Equipment Utilized During Treatment: Gait belt Activity Tolerance: Patient tolerated treatment well Patient left: with call bell/phone within reach;with chair alarm set   PT Visit Diagnosis: Unsteadiness on feet (R26.81);Repeated falls (R29.6);Muscle weakness (generalized) (M62.81)     Time: 9735-3299 PT Time Calculation (min) (ACUTE ONLY): 13 min  Charges:  $Gait Training: 8-22 mins                     Malachi Pro, DPT 12/10/2021, 11:07 AM

## 2021-12-10 NOTE — TOC Transition Note (Signed)
Transition of Care [facility]) - CM/SW Discharge Note   Patient Details  Name: Eileen Walsh MRN: [id number] Date of Birth: August 15, 1928  Transition of Care [facility]) CM/SW Contact:  Chapman Fitch, RN Phone Number: 12/10/2021, 1:08 PM   Clinical Narrative:    Patient will DC [id number] Anticipated DC date: 12/10/21  Family notified:MD has updated family Transport [id number] request to transport patient to facility.  [name] with [facility] notified   Per MD patient ready for DC to . RN, and facility notified of DC. Discharge Summary sent to facility. RN given number for report. TOC signing off.  [name] [facility] [phone]      Barriers to Discharge: Continued Medical Work up   Patient Goals and CMS Choice Patient states their goals for this hospitalization and ongoing recovery are:: to go home CMS Medicare.gov Compare Post Acute Care list provided to:: Patient Choice offered to / list presented to : Patient  Discharge Placement                       Discharge Plan and Services                                     Social Determinants of Health (SDOH) Interventions     Readmission Risk Interventions     No data to display

## 2021-12-10 NOTE — Discharge Summary (Signed)
Physician Discharge Summary  Eileen Walsh GEX:[id number] DOB: Jan 08, 1929 DOA: 12/07/2021  PCP: [name]., MD  Admit date: 12/07/2021 Discharge date: 12/10/2021  Admitted From: home Discharge disposition: SNF  Brief narrative: Eileen Walsh is a 88 y.o. female with PMH significant for PAF not on systemic anticoagulation, HTN, HLD, GERD, CAD, macular degeneration, recent subdural hematoma November 26, 2021 Patient presented to ED on 6/25 from home with complaint of progressively worsening bilateral lower extremity weakness as well as generalized weakness for few days, leading to a fall.  She noticed cloudy urine but denies any dysuria or urinary frequency.  In the ED, patient was afebrile and hemodynamically stable Labs showed creatinine elevated to 1.8, troponin elevated 496, WBC count elevated to 38.7, initial lactic acid elevated to 4.7 Urinalysis with cloudy yellow urine with large leukocytes, many bacteria. Initially code stroke was triggered and neurology evaluated the patient.  CT head with unchanged previously noted subdural hematoma and no other acute findings.  Low suspicion of stroke and hence no further stroke work-up was pursued. Admitted to hospitalist service.  Subjective: Patient was seen and examined this morning.   Pleasant elderly Caucasian female.  Sitting up in chair.  Slept well last night.  Less anxious today.  Family at bedside.  Content with the care.  Patient feels ready for discharge to SNF today.  Principal Problem:   Generalized weakness Active Problems:   UTI (urinary tract infection)    Assessment and plan: Sepsis secondary to UTI -Presented with progressive generalized weakness and fall  -She had significantly elevated WBC count, lactic acid and elevated creatinine with stable blood pressure. -urinalysis with cloudy yellow urine and large leukocytes and many bacteria -Urine culture is growing more than 100,000 CFU per mL of Enterococcus  faecalis.  Currently on IV Unasyn.  Switch to oral Augmentin for 5 more days -Improving WBC count.  Lactic acid level normalized -Urinary symptoms improving with Pyridium.  Continue the same. Recent Labs  Lab 12/07/21 1245 12/07/21 1308 12/07/21 1700 12/08/21 0250 12/09/21 0539  WBC 38.7*  --   --  26.2* 17.6*  LATICACIDVEN  --  4.7* 3.3* 1.0  --    Mild acute diverticulitis -CT scan obtained on admission showed mild inflammatory changes around the diverticulum in the cecum suggestive of mild diverticulitis. -Bowel movement regular.  Abdomen pain improving after.  And was started.   -Current antibiotic coverage with IV Unasyn and Augmentin at discharge should provide coverage for diverticulitis as well. -Start scheduled Tylenol and as needed tramadol for pain  Generalized weakness and fall -Probably related to new onset of UTI.  Expect improvement with antibiotics -PT evaluation appreciated.  Recommended SNF.   AKI -Clinically appears to be euvolemic -Creatinine was elevated to 1.8 on admission.  Improved with IV fluid.   Recent Labs    01/14/21 0948 07/02/21 1339 07/15/21 1400 11/26/21 1220 12/07/21 1245 12/08/21 0250 12/09/21 0539  BUN 19 16 18 23  26* 27* 15  CREATININE 1.10* 0.83 1.01* 0.84 1.80* 1.14* 0.74   Essential hypertension -PTA on amlodipine 5 mg daily, olmesartan 40 mg daily -Blood pressure running elevated.  Resume amlodipine yesterday.  Okay to resume olmesartan at discharge.   Paroxysmal A-fib -Not on any AV nodal blocking agent -Not on anticoagulation because of recent intracranial bleed.    Generalized anxiety -Patient seems to have more anxious/panic feeling amplifying her somatic symptoms. -6/27, I started her on Cymbalta nightly.  Feels better.  Continue same along with  melatonin for sleep.  Goals of care   Code Status: Full Code    Mobility: Encourage ambulation.  PT eval ordered  Skin assessment:     Nutritional status:  Body mass  index is 26.59 kg/m.          Wounds:  -    Discharge Exam:   Vitals:   12/09/21 1601 12/09/21 2058 12/10/21 0435 12/10/21 0755  BP: (!) 153/64 (!) 157/68 (!) 176/72 (!) 165/74  Pulse: 71 76 75 74  Resp: 16 18 17 20   Temp: 97.9 F (36.6 C) 98 F (36.7 C) 98 F (36.7 C) 97.9 F (36.6 C)  TempSrc:    Oral  SpO2: 97% 97% 93% 98%  Weight:      Height:        Body mass index is 26.59 kg/m.  General exam: Pleasant, elderly Caucasian female.  Skin: No rashes, lesions or ulcers. HEENT: Atraumatic, normocephalic, no obvious bleeding Lungs: Clear to auscultation bilaterally CVS: Regular rate and rhythm, mild systolic ejection murmur GI/Abd soft, nontender, nondistended, bowel sound present CNS: Alert, awake, oriented x3 Psychiatry: Mood appropriate Extremities: No pedal edema, no calf tenderness  Follow ups:    Follow-up Information     ., MD Follow up.   Specialty: Family Medicine Contact information: [address] [place] [address] [place] [phone]                 Discharge Instructions:   Discharge Instructions     Call MD for:  difficulty breathing, headache or visual disturbances   Complete by: As directed    Call MD for:  extreme fatigue   Complete by: As directed    Call MD for:  hives   Complete by: As directed    Call MD for:  persistant dizziness or light-headedness   Complete by: As directed    Call MD for:  persistant nausea and vomiting   Complete by: As directed    Call MD for:  severe uncontrolled pain   Complete by: As directed    Call MD for:  temperature >100.4   Complete by: As directed    Diet general   Complete by: As directed    Discharge instructions   Complete by: As directed    General discharge instructions: Follow with Primary MD [phone]., MD in 7 days  Please request your PCP  to go over your hospital tests, procedures, radiology results at the follow up. Please get  your medicines reviewed and adjusted.  Your PCP may decide to repeat certain labs or tests as needed. Do not drive, operate heavy machinery, perform activities at heights, swimming or participation in water activities or provide baby sitting services if your were admitted for syncope or siezures until you have seen by Primary MD or a Neurologist and advised to do so again. [name] Controlled Substance Reporting System database was reviewed. Do not drive, operate heavy machinery, perform activities at heights, swim, participate in water activities or provide baby-sitting services while on medications for pain, sleep and mood until your outpatient physician has reevaluated you and advised to do so again.  You are strongly recommended to comply with the dose, frequency and duration of prescribed medications. Activity: As tolerated with Full fall precautions use walker/cane & assistance as needed Avoid using any recreational substances like cigarette, tobacco, alcohol, or non-prescribed drug. If you experience worsening of your admission symptoms, develop shortness of breath, life threatening emergency,  suicidal or homicidal thoughts you must seek medical attention immediately by calling 911 or calling your MD immediately  if symptoms less severe. You must read complete instructions/literature along with all the possible adverse reactions/side effects for all the medicines you take and that have been prescribed to you. Take any new medicine only after you have completely understood and accepted all the possible adverse reactions/side effects.  Wear Seat belts while driving. You were cared for by a hospitalist during your hospital stay. If you have any questions about your discharge medications or the care you received while you were in the hospital after you are discharged, you can call the unit and ask to speak with the hospitalist or the covering physician. Once you are discharged, your primary care  physician will handle any further medical issues. Please note that NO REFILLS for any discharge medications will be authorized once you are discharged, as it is imperative that you return to your primary care physician (or establish a relationship with a primary care physician if you do not have one).   Increase activity slowly   Complete by: As directed        Discharge Medications:   Allergies as of 12/10/2021       Reactions   Aspirin Other (See Comments)   Sulfa Antibiotics Hives, Other (See Comments)        Medication List     STOP taking these medications    naproxen sodium 220 MG tablet Commonly known as: ALEVE       TAKE these medications    alum & mag hydroxide-simeth 200-200-20 MG/5ML suspension Commonly known as: MAALOX/MYLANTA Take 30 mLs by mouth every 6 (six) hours as needed for indigestion or heartburn.   amLODipine-olmesartan 5-40 MG tablet Commonly known as: AZOR Take 1 tablet by mouth daily.   amoxicillin-clavulanate 875-125 MG tablet Commonly known as: AUGMENTIN Take 1 tablet by mouth every 12 (twelve) hours for 5 days.   clobetasol 0.05 % external solution Commonly known as: TEMOVATE Apply 1 application topically 2 (two) times daily. Mix 1 bottle into 1 tub of cerave cream and use qd/bid aa rash on back, sides, buttocks until clear   DULoxetine 20 MG capsule Commonly known as: CYMBALTA Take 1 capsule (20 mg total) by mouth at bedtime.   hydrocortisone 2.5 % cream Apply topically 2 (two) times daily.   ketoconazole 2 % cream Commonly known as: NIZORAL Apply topically daily.   melatonin 5 MG Tabs Take 1 tablet (5 mg total) by mouth at bedtime.   multivitamin with minerals Tabs tablet Take 1 tablet by mouth daily.   omeprazole 20 MG capsule Commonly known as: PRILOSEC TAKE 1 CAPSULE EVERY       MORNING   phenazopyridine 100 MG tablet Commonly known as: PYRIDIUM Take 1 tablet (100 mg total) by mouth 3 (three) times daily with meals  for 5 days.         The results of significant diagnostics from this hospitalization (including imaging, microbiology, ancillary and laboratory) are listed below for reference.    Procedures and Diagnostic Studies:   ECHOCARDIOGRAM COMPLETE  Result Date: 12/09/2021    ECHOCARDIOGRAM REPORT   Patient Name:   TRUC WINFREE Date of Exam: 12/08/2021 Medical Rec #:  [id number]        Height:       67.0 in Accession #:    [id number]       Weight:       169.8 lb Date of  Birth:  1928-07-04        BSA:          1.886 m Patient Age:    89 years         BP:           185/65 mmHg Patient Gender: F                HR:           78 bpm. Exam Location:  [facility] Procedure: 2D Echo, Color Doppler and Cardiac Doppler Indications:     Abnormal ECG R94.31  History:         Patient has prior history of Echocardiogram examinations, most                  recent 07/17/2016. Risk Factors:Hypertension.  Sonographer:     Thurman Coyer RDCS Referring Phys:  [id number] [place] Mavis Fichera Diagnosing Phys: Marcina Millard MD IMPRESSIONS  1. Left ventricular ejection fraction, by estimation, is 60 to 65%. The left ventricle has normal function. The left ventricle has no regional wall motion abnormalities. Left ventricular diastolic parameters are consistent with Grade I diastolic dysfunction (impaired relaxation).  2. Right ventricular systolic function is normal. The right ventricular size is normal. There is normal pulmonary artery systolic pressure.  3. The mitral valve is normal in structure. Mild mitral valve regurgitation. No evidence of mitral stenosis.  4. The aortic valve is normal in structure. Aortic valve regurgitation is mild. No aortic stenosis is present.  5. The inferior vena cava is normal in size with greater than 50% respiratory variability, suggesting right atrial pressure of 3 mmHg. FINDINGS  Left Ventricle: Left ventricular ejection fraction, by estimation, is 60 to 65%. The left ventricle has normal function. The left  ventricle has no regional wall motion abnormalities. The left ventricular internal cavity size was normal in size. There is  no left ventricular hypertrophy. Left ventricular diastolic parameters are consistent with Grade I diastolic dysfunction (impaired relaxation). Right Ventricle: The right ventricular size is normal. No increase in right ventricular wall thickness. Right ventricular systolic function is normal. There is normal pulmonary artery systolic pressure. The tricuspid regurgitant velocity is 2.50 m/s, and  with an assumed right atrial pressure of 3 mmHg, the estimated right ventricular systolic pressure is 28.0 mmHg. Left Atrium: Left atrial size was normal in size. Right Atrium: Right atrial size was normal in size. Pericardium: There is no evidence of pericardial effusion. Mitral Valve: The mitral valve is normal in structure. Mild mitral valve regurgitation. No evidence of mitral valve stenosis. Tricuspid Valve: The tricuspid valve is normal in structure. Tricuspid valve regurgitation is mild . No evidence of tricuspid stenosis. Aortic Valve: The aortic valve is normal in structure. Aortic valve regurgitation is mild. No aortic stenosis is present. Pulmonic Valve: The pulmonic valve was normal in structure. Pulmonic valve regurgitation is not visualized. No evidence of pulmonic stenosis. Aorta: The aortic root is normal in size and structure. Venous: The inferior vena cava is normal in size with greater than 50% respiratory variability, suggesting right atrial pressure of 3 mmHg. IAS/Shunts: No atrial level shunt detected by color flow Doppler.  LEFT VENTRICLE PLAX 2D LVIDd:         3.90 cm     Diastology LVIDs:         2.50 cm     LV e' medial:    4.70 cm/s LV PW:         0.90 cm  LV E/e' medial:  20.4 LV IVS:        0.90 cm     LV e' lateral:   8.27 cm/s LVOT diam:     2.00 cm     LV E/e' lateral: 11.6 LV SV:         67 LV SV Index:   36 LVOT Area:     3.14 cm  LV Volumes (MOD) LV vol d, MOD  A2C: 48.4 ml LV vol d, MOD A4C: 68.8 ml LV vol s, MOD A2C: 14.1 ml LV vol s, MOD A4C: 23.1 ml LV SV MOD A2C:     34.3 ml LV SV MOD A4C:     68.8 ml LV SV MOD BP:      40.0 ml RIGHT VENTRICLE RV Basal diam:  3.40 cm RV Mid diam:    3.30 cm RV S prime:     10.90 cm/s TAPSE (M-mode): 2.4 cm LEFT ATRIUM             Index        RIGHT ATRIUM           Index LA diam:        3.20 cm 1.70 cm/m   RA Area:     18.90 cm LA Vol (A2C):   40.9 ml 21.68 ml/m  RA Volume:   55.30 ml  29.32 ml/m LA Vol (A4C):   37.9 ml 20.09 ml/m LA Biplane Vol: 40.7 ml 21.58 ml/m  AORTIC VALVE LVOT Vmax:   102.00 cm/s LVOT Vmean:  61.200 cm/s LVOT VTI:    0.214 m  AORTA Ao Root diam: 3.00 cm MITRAL VALVE                TRICUSPID VALVE MV Area (PHT): 3.46 cm     TR Peak grad:   25.0 mmHg MV Decel Time: 219 msec     TR Vmax:        250.00 cm/s MV E velocity: 95.70 cm/s MV A velocity: 111.00 cm/s  SHUNTS MV E/A ratio:  0.86         Systemic VTI:  0.21 m                             Systemic Diam: 2.00 cm Marcina MillardAlexander Paraschos MD Electronically signed by Marcina MillardAlexander Paraschos MD Signature Date/Time: 12/09/2021/12:44:15 PM    Final      Labs:   Basic Metabolic Panel: Recent Labs  Lab 12/07/21 1245 12/08/21 0250 12/09/21 0539  NA 138 132* 138  K 4.1 4.2 3.7  CL 109 107 110  CO2 20* 21* 22  GLUCOSE 156* 102* 117*  BUN 26* 27* 15  CREATININE 1.80* 1.14* 0.74  CALCIUM 9.1 8.3* 8.6*  MG  --   --  1.8  PHOS  --   --  2.5   GFR Estimated Creatinine Clearance: 50 mL/min (by C-G formula based on SCr of 0.74 mg/dL). Liver Function Tests: Recent Labs  Lab 12/07/21 1245 12/09/21 0539  AST 41 28  ALT 21 19  ALKPHOS 62 62  BILITOT 0.6 0.8  PROT 6.4* 6.5  ALBUMIN 3.2* 3.1*   No results for input(s): "LIPASE", "AMYLASE" in the last 168 hours. No results for input(s): "AMMONIA" in the last 168 hours. Coagulation profile No results for input(s): "INR", "PROTIME" in the last 168 hours.  CBC: Recent Labs  Lab 12/07/21 1245  12/08/21 0250 12/09/21 0539  WBC 38.7* 26.2*  17.6*  NEUTROABS 34.9*  --  13.9*  HGB 13.5 11.7* 12.7  HCT 40.4 35.2* 37.7  MCV 93.3 91.4 91.3  PLT 231 182 198   Cardiac Enzymes: No results for input(s): "CKTOTAL", "CKMB", "CKMBINDEX", "TROPONINI" in the last 168 hours. BNP: Invalid input(s): "POCBNP" CBG: Recent Labs  Lab 12/07/21 1241  GLUCAP 149*   D-Dimer No results for input(s): "DDIMER" in the last 72 hours. Hgb A1c No results for input(s): "HGBA1C" in the last 72 hours. Lipid Profile No results for input(s): "CHOL", "HDL", "LDLCALC", "TRIG", "CHOLHDL", "LDLDIRECT" in the last 72 hours. Thyroid function studies No results for input(s): "TSH", "T4TOTAL", "T3FREE", "THYROIDAB" in the last 72 hours.  Invalid input(s): "FREET3" Anemia work up No results for input(s): "VITAMINB12", "FOLATE", "FERRITIN", "TIBC", "IRON", "RETICCTPCT" in the last 72 hours. Microbiology Recent Results (from the past 240 hour(s))  Urine Culture     Status: Abnormal   Collection Time: 12/07/21 12:54 PM   Specimen: Urine, Catheterized  Result Value Ref Range Status   Specimen Description   Final    URINE, CATHETERIZED Performed at [facility], [address] [place], [place] [address]    Special Requests   Final    NONE Performed at [facility], [address] [place], [place] [address]    Culture >=100,000 COLONIES/mL ENTEROCOCCUS FAECALIS (A)  Final   Report Status 12/09/2021 FINAL  Final   Organism ID, Bacteria ENTEROCOCCUS FAECALIS (A)  Final      Susceptibility   Enterococcus faecalis - MIC*    AMPICILLIN <=2 SENSITIVE Sensitive     NITROFURANTOIN <=16 SENSITIVE Sensitive     VANCOMYCIN 1 SENSITIVE Sensitive     * >=100,000 COLONIES/mL ENTEROCOCCUS FAECALIS  Blood culture (routine x 2)     Status: None (Preliminary result)   Collection Time: 12/07/21  1:09 PM   Specimen: BLOOD  Result Value Ref Range Status   Specimen Description BLOOD LEFT ANTECUBITAL   Final   Special Requests   Final    BOTTLES DRAWN AEROBIC AND ANAEROBIC Blood Culture adequate volume   Culture   Final    NO GROWTH 3 DAYS Performed at [facility], [address] [place], [place] [address]    Report Status PENDING  Incomplete  Blood culture (routine x 2)     Status: None (Preliminary result)   Collection Time: 12/07/21  1:14 PM   Specimen: BLOOD  Result Value Ref Range Status   Specimen Description BLOOD RIGHT ANTECUBITAL  Final   Special Requests   Final    BOTTLES DRAWN AEROBIC AND ANAEROBIC Blood Culture adequate volume   Culture   Final    NO GROWTH 3 DAYS Performed at [facility], [address], [place], [place] [address]    Report Status PENDING  Incomplete    Time coordinating discharge: 35 minutes  Signed: Bessie Livingood  [facility] 12/10/2021, 12:06 PM

## 2021-12-10 NOTE — TOC Progression Note (Signed)
Transition of Care [facility]) - Progression Note    Patient Details  Name: Eileen Walsh MRN: [id number] Date of Birth: 1929/04/14  Transition of Care [facility]) CM/SW Contact  Chapman Fitch, RN Phone Number: 12/10/2021, 9:44 AM  Clinical Narrative:     Per [name] with [facility] they can take patient today if medically ready.  MD notified   Expected Discharge Plan: Skilled Nursing Facility Barriers to Discharge: Continued Medical Work up  Expected Discharge Plan and Services Expected Discharge Plan: Skilled Nursing Facility       Living arrangements for the past 2 months: Single Family Home                                       Social Determinants of Health (SDOH) Interventions    Readmission Risk Interventions     No data to display

## 2021-12-10 NOTE — Progress Notes (Addendum)
Patient discharged to [facility]. IV removed. Called report to [facility] nurse. All paper work in discharge packet. Patient transferred to [facility] POV with family.

## 2021-12-12 ENCOUNTER — Telehealth: Payer: Self-pay | Admitting: Emergency Medicine

## 2021-12-12 LAB — BLOOD CULTURE ID PANEL (REFLEXED) - BCID2

## 2021-12-12 LAB — CULTURE, BLOOD (ROUTINE X 2)
Culture: NO GROWTH
Special Requests: ADEQUATE

## 2021-12-12 NOTE — Telephone Encounter (Signed)
Pt has positive blood cultures in 1 of 4 bottles at this time, reviewed with Dr. Elesa Massed.  Per Dr. Elesa Massed, pt does not need to return to the hospital due to recently being hospitalized and treated with antibiotics, however, per Dr. Elesa Massed, the pt's provider at [facility] needs to be aware of the positive cultures and to prescribe the correct treatment for the patient.  [facility] called at this time and advised of this, lab results also faxed to facility.  Spoke to [employer] at [facility] and [employer] at [facility] also aware.

## 2021-12-12 NOTE — ED Provider Notes (Signed)
Informed by charge nurse that she was called by the lab to inform her that patient had positive blood cultures.  Was admitted to the hospital for UTI and urine cultures grew Enterococcus sensitive to ampicillin, nitrofurantoin and vancomycin.  Patient was receiving Unasyn IV in the hospital and then discharged on Augmentin.  It appears she was improving based on hospitalist notes.  She is receiving appropriate therapy.  Charge nurse to [employer] to update them with this information so that they can inform their physician, APP on call but I do not feel that she is clinically worsening that she needs to come back to the hospital at this time.   Eileen Walsh, Layla Maw, DO 12/12/21 [phone]

## 2021-12-14 LAB — CULTURE, BLOOD (ROUTINE X 2): Special Requests: ADEQUATE

## 2021-12-25 ENCOUNTER — Telehealth: Payer: Self-pay

## 2021-12-25 NOTE — Telephone Encounter (Signed)
Order has been sent to [facility]. No authorization is required with [facility] Medicare per Stanton Kidney. Ref# [id number]

## 2021-12-26 ENCOUNTER — Telehealth: Payer: Self-pay | Admitting: Family Medicine

## 2021-12-26 NOTE — Telephone Encounter (Signed)
Home Health Verbal Orders - [facility]  Callback Number: [phone]  Requesting OT/PT/Skilled Nursing/Social Work/Speech Therapy:    Skilled nursing 1 week 3

## 2021-12-26 NOTE — Telephone Encounter (Signed)
Just FYI- [facility] called to schedule repeat scan and the patients son declined appt, patient is now in [facility]

## 2021-12-26 NOTE — Telephone Encounter (Signed)
Please advise 

## 2021-12-29 ENCOUNTER — Telehealth: Payer: Self-pay | Admitting: Family Medicine

## 2021-12-29 NOTE — Telephone Encounter (Signed)
Left detailed message on vm giving verbal okay for requested orders.

## 2021-12-29 NOTE — Telephone Encounter (Signed)
Home Health Verbal Orders - Caller/Agency: Annamarie Dawley PT  Callback Number: [phone] Requesting OT/PT/Skilled Nursing/Social Work/Speech Therapy: PT Frequency:  1w1 2w4 1w4

## 2021-12-30 NOTE — Telephone Encounter (Signed)
Advised 

## 2022-01-28 ENCOUNTER — Other Ambulatory Visit: Payer: Self-pay | Admitting: Family Medicine

## 2022-01-28 NOTE — Telephone Encounter (Signed)
Requested medication (s) are due for refill today: Amount not specified  Requested medication (s) are on the active medication list: yes  Last refill: 12/10/21  Future visit scheduled      yes 02/04/22  Notes to clinic:Historical provider, please review. Thank you.  Requested Prescriptions  Pending Prescriptions Disp Refills   DULoxetine (CYMBALTA) 20 MG capsule  3    Sig: Take 1 capsule (20 mg total) by mouth at bedtime.     Psychiatry: Antidepressants - SNRI - duloxetine Failed - 01/28/2022  3:10 PM      Failed - Last BP in normal range    BP Readings from Last 1 Encounters:  12/10/21 (!) 165/74         Passed - Cr in normal range and within 360 days    Creat  Date Value Ref Range Status  04/26/2017 0.91 (H) 0.60 - 0.88 mg/dL Final    Comment:    For patients >15 years of age, the reference limit for Creatinine is approximately 13% higher for people identified as African-American. .    Creatinine, Ser  Date Value Ref Range Status  12/09/2021 0.74 0.44 - 1.00 mg/dL Final         Passed - eGFR is 30 or above and within 360 days    GFR, Est African American  Date Value Ref Range Status  04/26/2017 66 > OR = 60 mL/min/1.23m2 Final   GFR calc Af Amer  Date Value Ref Range Status  10/30/2019 44 (L) >59 mL/min/1.73 Final    Comment:    **[facility] currently reports eGFR in compliance with the current**   recommendations of the [employer]. [facility] will   update reporting as new guidelines are published from the NKF-ASN   Task force.    GFR, Est Non African American  Date Value Ref Range Status  04/26/2017 57 (L) > OR = 60 mL/min/1.59m2 Final   GFR, Estimated  Date Value Ref Range Status  12/09/2021 >60 >60 mL/min Final    Comment:    (NOTE) Calculated using the CKD-EPI Creatinine Equation (2021)    eGFR  Date Value Ref Range Status  07/15/2021 53 (L) >59 mL/min/1.73 Final         Passed - Completed PHQ-2 or PHQ-9 in the last 360 days       Passed - Valid encounter within last 6 months    Recent Outpatient Visits           5 months ago Encounter for [employer] annual wellness exam   [facility] Jerrol Banana., MD   6 months ago Symptoms of upper respiratory infection (URI)   [employer], Dani Gobble, PA-C   6 months ago Hyponatremia   [facility] Myles Gip, DO   11 months ago Essential hypertension   [facility] Jerrol Banana., MD   1 year ago Annual physical exam   [facility] Jerrol Banana., MD       Future Appointments             In 1 week Jerrol Banana., MD [facility], [place]

## 2022-01-28 NOTE — Telephone Encounter (Signed)
Medication Refill - Medication: DULoxetine (CYMBALTA) 20 MG capsule  Has the patient contacted their pharmacy? No.   Preferred Pharmacy (with phone number or street name):  [id number] - [place], [place] - [address] Phone:  [phone]  Fax:  [phone]     Has the patient been seen for an appointment in the last year OR does the patient have an upcoming appointment? Yes.    Patient was given this prescription at [facility] and her daughter said she can't just stop it so she needs a refill

## 2022-01-29 MED ORDER — DULOXETINE HCL 20 MG PO CPEP: 20.0000 mg | ORAL_CAPSULE | Freq: Every day | ORAL | 3 refills | Status: DC

## 2022-01-29 NOTE — Telephone Encounter (Signed)
Please advise refill? 

## 2022-02-03 NOTE — Progress Notes (Signed)
Established patient visit  I,Eileen Walsh,acting as a scribe for Eileen Mans, MD.,have documented all relevant documentation on the behalf of Eileen Mans, MD,as directed by  Eileen Mans, MD while in the presence of Eileen Mans, MD.   Patient: Eileen Walsh   DOB: July 10, 1928   88 y.o. Female  MRN: [id number] Visit Date: 02/04/2022  Today's healthcare provider: [name], MD   Chief Complaint  Patient presents with   Follow-up   Hypertension   Subjective    HPI  Patient comes in today for follow-up after a couple hospitalizations for subdural hematoma then another 1 for urosepsis followed by need for rehab in a facility.  Daughter Brings her in today.  Patient is feeling well.  She has no new complaints, just complains of same old problems of neuropathy arthritis and some depression.  She is having no urinary tract symptoms.  No symptoms from her subdural hematoma.  She is tolerating duloxetine at 20 mg daily and we all agree that she could benefit from possibly increasing to 30mg .  Hypertension, follow-up  BP Readings from Last 3 Encounters:  02/04/22 (!) 140/70  12/10/21 (!) 165/74  11/27/21 (!) 161/57   Wt Readings from Last 3 Encounters:  02/04/22 158 lb (71.7 kg)  12/07/21 169 lb 12.1 oz (77 kg)  08/07/21 171 lb 3.2 oz (77.7 kg)     She was last seen for hypertension 6 months ago.  Management since that visit includes; Fairly good control..  Outside blood pressures are not checking.  Pertinent labs Lab Results  Component Value Date   CHOL 302 (H) 01/14/2021   HDL 45 01/14/2021   LDLCALC 220 (H) 01/14/2021   TRIG 190 (H) 01/14/2021   CHOLHDL 6.7 (H) 01/14/2021   Lab Results  Component Value Date   NA 138 12/09/2021   K 3.7 12/09/2021   CREATININE 0.74 12/09/2021   GFRNONAA >60 12/09/2021   GLUCOSE 117 (H) 12/09/2021   TSH 2.530 01/14/2021     The ASCVD Risk score (Arnett DK, et al., 2019) failed to calculate for  the following reasons:   The 2019 ASCVD risk score is only valid for ages 54 to 24  ---------------------------------------------------------------------------------------------------   Medications: Outpatient Medications Prior to Visit  Medication Sig   alum & mag hydroxide-simeth (MAALOX/MYLANTA) 200-200-20 MG/5ML suspension Take 30 mLs by mouth every 6 (six) hours as needed for indigestion or heartburn.   amLODipine-olmesartan (AZOR) 5-40 MG tablet Take 1 tablet by mouth daily.   clobetasol (TEMOVATE) 0.05 % external solution Apply 1 application topically 2 (two) times daily. Mix 1 bottle into 1 tub of cerave cream and use qd/bid aa rash on back, sides, buttocks until clear   DULoxetine (CYMBALTA) 20 MG capsule Take 1 capsule (20 mg total) by mouth at bedtime.   hydrocortisone 2.5 % cream Apply topically 2 (two) times daily.   ketoconazole (NIZORAL) 2 % cream Apply topically daily.   Multiple Vitamin (MULTIVITAMIN WITH MINERALS) TABS tablet Take 1 tablet by mouth daily.   omeprazole (PRILOSEC) 20 MG capsule TAKE 1 CAPSULE EVERY       MORNING   [DISCONTINUED] melatonin 5 MG TABS Take 1 tablet (5 mg total) by mouth at bedtime. (Patient not taking: Reported on 02/04/2022)   No facility-administered medications prior to visit.    Review of Systems  Constitutional:  Negative for appetite change, chills, fatigue and fever.  Respiratory:  Negative for chest tightness and shortness of  breath.   Cardiovascular:  Negative for chest pain and palpitations.  Gastrointestinal:  Negative for abdominal pain, nausea and vomiting.  Neurological:  Negative for dizziness and weakness.        Objective    BP (!) 140/70 (BP Location: Right Arm, Patient Position: Sitting, Cuff Size: Normal)   Pulse 93   Resp 16   Wt 158 lb (71.7 kg)   SpO2 97%   BMI 24.75 kg/m  BP Readings from Last 3 Encounters:  02/04/22 (!) 140/70  12/10/21 (!) 165/74  11/27/21 (!) 161/57   Wt Readings from Last 3  Encounters:  02/04/22 158 lb (71.7 kg)  12/07/21 169 lb 12.1 oz (77 kg)  08/07/21 171 lb 3.2 oz (77.7 kg)      Physical Exam Vitals reviewed.  Constitutional:      Appearance: She is well-developed. She is obese.  HENT:     Head: Normocephalic and atraumatic.     Right Ear: External ear normal.     Left Ear: External ear normal.     Nose: Nose normal.  Eyes:     General: No scleral icterus.    Conjunctiva/sclera: Conjunctivae normal.  Cardiovascular:     Rate and Rhythm: Normal rate and regular rhythm.     Heart sounds: Normal heart sounds.  Pulmonary:     Effort: Pulmonary effort is normal.     Breath sounds: Normal breath sounds.  Abdominal:     Palpations: Abdomen is soft.  Skin:    General: Skin is warm and dry.  Neurological:     Mental Status: She is alert and oriented to person, place, and time.  Psychiatric:        Behavior: Behavior normal.        Thought Content: Thought content normal.        Judgment: Judgment normal.       No results found for any visits on 02/04/22.  Assessment & Plan     1. Generalized weakness Patient has recovered from urosepsis.  Back at home.  I think the daughter checks in with her more.  2. SDH (subdural hematoma) (HCC) Asymptomatic and apparently stable.  3. Essential (primary) hypertension   4. Fibromyalgia Myalgia doses I think this will help her chronic depression and he neuropathy symptoms - DULoxetine (CYMBALTA) 30 MG capsule; Take 1 capsule (30 mg total) by mouth daily.  Dispense: 90 capsule; Refill: 1 - DULoxetine (CYMBALTA) 30 MG capsule; Take 1 capsule (30 mg total) by mouth daily.  Dispense: 30 capsule; Refill: 0  5. Class 2 severe obesity due to excess calories with serious comorbidity and body mass index (BMI) of 35.0 to 35.9 in adult (HCC)   6. Hyperlipidemia, unspecified hyperlipidemia type   7. Atrial fibrillation with RVR (HCC) Subdural any anticoagulation would be contraindicated  8.  Choledocholithiasis Solved surgically   No follow-ups on file.      I, Eileen Mans, MD, have reviewed all documentation for this visit. The documentation on 02/06/22 for the exam, diagnosis, procedures, and orders are all accurate and complete.    Eileen Walsh Wendelyn Breslow, MD  [facility] [phone] (phone) [phone] (fax)  [facility]

## 2022-02-04 ENCOUNTER — Encounter: Payer: Self-pay | Admitting: Family Medicine

## 2022-02-04 ENCOUNTER — Ambulatory Visit (INDEPENDENT_AMBULATORY_CARE_PROVIDER_SITE_OTHER): Payer: Medicare Other | Admitting: Family Medicine

## 2022-02-04 VITALS — BP 140/70 | HR 93 | Resp 16 | Wt 158.0 lb

## 2022-02-04 DIAGNOSIS — M797 Fibromyalgia: Secondary | ICD-10-CM

## 2022-02-04 DIAGNOSIS — S065XAA Traumatic subdural hemorrhage with loss of consciousness status unknown, initial encounter: Secondary | ICD-10-CM

## 2022-02-04 DIAGNOSIS — R531 Weakness: Secondary | ICD-10-CM

## 2022-02-04 DIAGNOSIS — I10 Essential (primary) hypertension: Secondary | ICD-10-CM | POA: Diagnosis not present

## 2022-02-04 DIAGNOSIS — I4891 Unspecified atrial fibrillation: Secondary | ICD-10-CM

## 2022-02-04 DIAGNOSIS — K8050 Calculus of bile duct without cholangitis or cholecystitis without obstruction: Secondary | ICD-10-CM

## 2022-02-04 DIAGNOSIS — Z6835 Body mass index (BMI) 35.0-35.9, adult: Secondary | ICD-10-CM

## 2022-02-04 DIAGNOSIS — E785 Hyperlipidemia, unspecified: Secondary | ICD-10-CM

## 2022-02-04 MED ORDER — DULOXETINE HCL 30 MG PO CPEP: 30.0000 mg | ORAL_CAPSULE | Freq: Every day | ORAL | 0 refills | Status: DC

## 2022-02-04 MED ORDER — DULOXETINE HCL 30 MG PO CPEP: 30.0000 mg | ORAL_CAPSULE | Freq: Every day | ORAL | 1 refills | Status: DC

## 2023-03-01 ENCOUNTER — Ambulatory Visit: Payer: Medicare Other | Admitting: Dermatology

## 2023-03-01 ENCOUNTER — Ambulatory Visit (INDEPENDENT_AMBULATORY_CARE_PROVIDER_SITE_OTHER): Payer: Medicare Other | Admitting: Dermatology

## 2023-03-01 ENCOUNTER — Encounter: Payer: Self-pay | Admitting: Dermatology

## 2023-03-01 VITALS — BP 115/76 | HR 99

## 2023-03-01 DIAGNOSIS — L209 Atopic dermatitis, unspecified: Secondary | ICD-10-CM

## 2023-03-01 DIAGNOSIS — R21 Rash and other nonspecific skin eruption: Secondary | ICD-10-CM

## 2023-03-01 MED ORDER — CLOBETASOL PROPIONATE 0.05 % EX SOLN: CUTANEOUS | 1 refills | Status: DC

## 2023-03-01 NOTE — Patient Instructions (Addendum)
Eczema Skin Care  Buy TWO 16oz jars of CeraVe moisturizing cream  [facility], [facility], [facility] (no prescription needed)  Costs about $15 per jar   Jar #1: Use as a moisturizer as needed. Can be applied to any area of the body. Use twice daily to unaffected areas.  Jar #2: Pour one 50ml bottle of clobetasol 0.05% solution into jar, mix well. Label this jar to indicate the medication has been added. Use twice daily to affected areas. Do not apply to face, groin or underarms.  Moisturizer may burn or sting initially. Try for at least 4 weeks.    Due to recent changes in healthcare laws, you may see results of your pathology and/or laboratory studies on MyChart before the doctors have had a chance to review them. We understand that in some cases there may be results that are confusing or concerning to you. Please understand that not all results are received at the same time and often the doctors may need to interpret multiple results in order to provide you with the best plan of care or course of treatment. Therefore, we ask that you please give [place] 2 business days to thoroughly review all your results before contacting the office for clarification. Should we see a critical lab result, you will be contacted sooner.   If You Need Anything After Your Visit  If you have any questions or concerns for your doctor, please call our main line at [phone] and press option 4 to reach your doctor's medical assistant. If no one answers, please leave a voicemail as directed and we will return your call as soon as possible. Messages left after 4 pm will be answered the following business day.   You may also send [place] a message via MyChart. We typically respond to MyChart messages within 1-2 business days.  For prescription refills, please ask your pharmacy to contact our office. Our fax number is [phone].  If you have an urgent issue when the clinic is closed that cannot wait until the next business day, you  can page your doctor at the number below.    Please note that while we do our best to be available for urgent issues outside of office hours, we are not available 24/7.   If you have an urgent issue and are unable to reach [place], you may choose to seek medical care at your doctor's office, retail clinic, urgent care center, or emergency room.  If you have a medical emergency, please immediately call 911 or go to the emergency department.  Pager Numbers  - Dr. Gwen Pounds: [phone]  - Dr. Roseanne Reno: [phone]  - Dr. Katrinka Blazing: [phone]   In the event of inclement weather, please call our main line at [phone] for an update on the status of any delays or closures.  Dermatology Medication Tips: Please keep the boxes that topical medications come in in order to help keep track of the instructions about where and how to use these. Pharmacies typically print the medication instructions only on the boxes and not directly on the medication tubes.   If your medication is too expensive, please contact our office at [phone] option 4 or send [place] a message through MyChart.   We are unable to tell what your co-pay for medications will be in advance as this is different depending on your insurance coverage. However, we may be able to find a substitute medication at lower cost or fill out paperwork to get insurance to cover a needed medication.  If a prior authorization is required to get your medication covered by your insurance company, please allow [place] 1-2 business days to complete this process.  Drug prices often vary depending on where the prescription is filled and some pharmacies may offer cheaper prices.  The website www.goodrx.com contains coupons for medications through different pharmacies. The prices here do not account for what the cost may be with help from insurance (it may be cheaper with your insurance), but the website can give you the price if you did not use any insurance.  -  You can print the associated coupon and take it with your prescription to the pharmacy.  - You may also stop by our office during regular business hours and pick up a GoodRx coupon card.  - If you need your prescription sent electronically to a different pharmacy, notify our office through [facility] or by phone at [phone] option 4.     Si Usted Necesita Algo Despus de Su Visita  Tambin puede enviarnos un mensaje a travs de Clinical cytogeneticist. Por lo general respondemos a los mensajes de MyChart en el transcurso de 1 a 2 das hbiles.  Para renovar recetas, por favor pida a su farmacia que se ponga en contacto con nuestra oficina. Annie Sable de fax es [place] [phone].  Si tiene un asunto urgente cuando la clnica est cerrada y que no puede esperar hasta el siguiente da hbil, puede llamar/localizar a su doctor(a) al nmero que aparece a continuacin.   Por favor, tenga en cuenta que aunque hacemos todo lo posible para estar disponibles para asuntos urgentes fuera del horario de [place], no estamos disponibles las 24 horas del da, los 7 [address]  [address] de la [place].   Si tiene un problema urgente y no puede comunicarse con nosotros, puede optar por buscar atencin mdica  en el consultorio de su doctor(a), en una clnica privada, en un centro de atencin urgente o en una sala de emergencias.  Si tiene Engineer, drilling, por favor llame inmediatamente al 911 o vaya a la sala de emergencias.  Nmeros de bper  - Dr. Gwen Pounds: [phone]  - Dra. Roseanne Reno: [phone]  - Dr. Katrinka Blazing: [phone]   En caso de inclemencias del tiempo, por favor llame a Lacy Duverney principal al [phone] para una actualizacin sobre el [place] de cualquier retraso o cierre.  Consejos para la medicacin en dermatologa: Por favor, guarde las cajas en las que vienen los medicamentos de uso tpico para ayudarle a seguir las instrucciones sobre dnde y cmo usarlos. Las farmacias generalmente imprimen  las instrucciones del medicamento slo en las cajas y no directamente en los tubos del [place].   Si su medicamento es muy caro, por favor, pngase en contacto con Rolm Gala llamando al [phone] y presione la opcin 4 o envenos un mensaje a travs de Clinical cytogeneticist.   No podemos decirle cul ser su copago por los medicamentos por adelantado ya que esto es diferente dependiendo de la cobertura de su seguro. Sin embargo, es posible que podamos encontrar un medicamento sustituto a Audiological scientist un formulario para que el seguro cubra el medicamento que se considera necesario.   Si se requiere una autorizacin previa para que su compaa de seguros [place] su medicamento, por favor permtanos de 1 a 2 das hbiles para completar [address]  Los precios de los medicamentos varan con frecuencia dependiendo del Environmental consultant de dnde se surte la receta y alguna farmacias pueden ofrecer precios ms baratos.  El sitio web  www.goodrx.com tiene cupones para medicamentos de Health and safety inspector. Los precios aqu no tienen en cuenta lo que podra costar con la ayuda del seguro (puede ser ms barato con su seguro), pero el sitio web puede darle el precio si no utiliz Tourist information centre manager.  - Puede imprimir el cupn correspondiente y llevarlo con su receta a la farmacia.  - Tambin puede pasar por nuestra oficina durante el horario de atencin regular y Education officer, museum una tarjeta de cupones de GoodRx.  - Si necesita que su receta se enve electrnicamente a una farmacia diferente, informe a nuestra oficina a travs de MyChart de [place] o por telfono llamando al [phone] y presione la opcin 4.

## 2023-03-01 NOTE — Progress Notes (Signed)
    Follow-Up Visit   Subjective  Eileen Walsh is a 89 y.o. female who presents for the following: Patient c/o itching on the back x 1 + week that is bothersome, she scratches area, and she would like to discuss treatment options today.  The patient has spots, moles and lesions to be evaluated, some may be new or changing and the patient may have concern these could be cancer.   The following portions of the chart were reviewed this encounter and updated as appropriate: medications, allergies, medical history  Review of Systems:  No other skin or systemic complaints except as noted in HPI or Assessment and Plan.  Objective  Well appearing patient in no apparent distress; mood and affect are within normal limits.   A focused examination was performed of the following areas: the face, chest, ba   Relevant exam findings are noted in the Assessment and Plan.    Assessment & Plan    Rash Exam: Eczematous plaque with excoriation on R lower leg and upper central back.  Chronic and flaring, not at patient goal  Differential diagnosis:  Atopic dermatitis   Treatment Plan: Restart Clobetasol/CeraVe mix QD-BID PRN. Patient requesting two bottles at a time. Topical steroids (such as triamcinolone, fluocinolone, fluocinonide, mometasone, clobetasol, halobetasol, betamethasone, hydrocortisone) can cause thinning and lightening of the skin if they are used for too long in the same area. Your physician has selected the right strength medicine for your problem and area affected on the body. Please use your medication only as directed by your physician to prevent side effects.  Dryness exacerbates condition Return if not improving  Eczema Skin Care  Buy TWO 16oz jars of CeraVe moisturizing cream  [facility], [facility], [facility] (no prescription needed)  Costs about $15 per jar   Jar #1: Use as a moisturizer as needed. Can be applied to any area of the body. Use twice daily to unaffected  areas.  Jar #2: Pour one 50ml bottle of clobetasol 0.05% solution into jar, mix well. Label this jar to indicate the medication has been added. Use twice daily to affected areas. Do not apply to face, groin or underarms.  Moisturizer may burn or sting initially. Try for at least 4 weeks.    Patient c/o neuropathy in the feet and toes -  Pt to follow up with PCP for treatment options.  Atopic dermatitis, unspecified type   Return if symptoms worsen or fail to improve.  Maylene Roes, CMA, am acting as scribe for Elie Goody, MD .  Documentation: I have reviewed the above documentation for accuracy and completeness, and I agree with the above.  Elie Goody, MD

## 2023-07-16 ENCOUNTER — Emergency Department: Payer: Medicare Other

## 2023-07-16 ENCOUNTER — Other Ambulatory Visit: Payer: Self-pay

## 2023-07-16 DIAGNOSIS — I2510 Atherosclerotic heart disease of native coronary artery without angina pectoris: Secondary | ICD-10-CM | POA: Diagnosis not present

## 2023-07-16 DIAGNOSIS — S0990XA Unspecified injury of head, initial encounter: Secondary | ICD-10-CM | POA: Insufficient documentation

## 2023-07-16 DIAGNOSIS — Y92015 Private garage of single-family (private) house as the place of occurrence of the external cause: Secondary | ICD-10-CM | POA: Diagnosis not present

## 2023-07-16 DIAGNOSIS — N3000 Acute cystitis without hematuria: Secondary | ICD-10-CM | POA: Diagnosis not present

## 2023-07-16 DIAGNOSIS — I10 Essential (primary) hypertension: Secondary | ICD-10-CM | POA: Insufficient documentation

## 2023-07-16 DIAGNOSIS — W01198A Fall on same level from slipping, tripping and stumbling with subsequent striking against other object, initial encounter: Secondary | ICD-10-CM | POA: Diagnosis not present

## 2023-07-16 LAB — CBC WITH DIFFERENTIAL/PLATELET
Abs Immature Granulocytes: 0.04 10*3/uL (ref 0.00–0.07)
Basophils Absolute: 0.1 10*3/uL (ref 0.0–0.1)
Basophils Relative: 1 %
Eosinophils Absolute: 0.2 10*3/uL (ref 0.0–0.5)
Eosinophils Relative: 2 %
HCT: 43.4 % (ref 36.0–46.0)
Hemoglobin: 14.4 g/dL (ref 12.0–15.0)
Immature Granulocytes: 0 %
Lymphocytes Relative: 13 %
Lymphs Abs: 1.5 10*3/uL (ref 0.7–4.0)
MCH: 31.2 pg (ref 26.0–34.0)
MCHC: 33.2 g/dL (ref 30.0–36.0)
MCV: 94.1 fL (ref 80.0–100.0)
Monocytes Absolute: 0.8 10*3/uL (ref 0.1–1.0)
Monocytes Relative: 7 %
Neutro Abs: 8.8 10*3/uL — ABNORMAL HIGH (ref 1.7–7.7)
Neutrophils Relative %: 77 %
Platelets: 256 10*3/uL (ref 150–400)
RBC: 4.61 MIL/uL (ref 3.87–5.11)
RDW: 12.5 % (ref 11.5–15.5)
WBC: 11.3 10*3/uL — ABNORMAL HIGH (ref 4.0–10.5)
nRBC: 0 % (ref 0.0–0.2)

## 2023-07-16 LAB — BASIC METABOLIC PANEL
Anion gap: 10 (ref 5–15)
BUN: 19 mg/dL (ref 8–23)
CO2: 25 mmol/L (ref 22–32)
Calcium: 9.4 mg/dL (ref 8.9–10.3)
Chloride: 102 mmol/L (ref 98–111)
Creatinine, Ser: 0.98 mg/dL (ref 0.44–1.00)
GFR, Estimated: 54 mL/min — ABNORMAL LOW (ref >60)
Glucose, Bld: 169 mg/dL — ABNORMAL HIGH (ref 70–99)
Potassium: 3.9 mmol/L (ref 3.5–5.1)
Sodium: 137 mmol/L (ref 135–145)

## 2023-07-16 NOTE — ED Notes (Signed)
Family to desk asking why was the wait so long and when would she get her lab results. Family was educated on the long wait.

## 2023-07-16 NOTE — ED Provider Triage Note (Signed)
Emergency Medicine Provider Triage Evaluation Note  GERTHA LICHTENBERG , a 89 y.o. female  was evaluated in triage.  Pt complains of headache and neck pain after falling while in her garage today. Neighbor witnessed fall. No loss of consciousness.  Physical Exam  There were no vitals taken for this visit. Gen:   Awake, no distress   Resp:  Normal effort  MSK:   Moves extremities without difficulty  Other:  Hematoma to back of head  Medical Decision Making  Medically screening exam initiated at 7:44 PM.  Appropriate orders placed.  NIVEDITA MIRABELLA was informed that the remainder of the evaluation will be completed by another provider, this initial triage assessment does not replace that evaluation, and the importance of remaining in the ED until their evaluation is complete.     Chinita Pester, FNP 07/16/23 1946

## 2023-07-16 NOTE — ED Triage Notes (Signed)
Pt slipped and fell in her garage hitting the back of her head on concrete. Denies LOC. A neighbor witnessed the fall. Pt complaining of head and posterior neck pain. Pt has an abrasion and hematoma on posterior aspect of her head.

## 2023-07-16 NOTE — ED Triage Notes (Signed)
Patient arrived by [facility] from home. Fell and hit head on concrete on carport and found by neighbor. Patient c/o dizziness. Son pulled up while EMS was there but poor historian and unsure of patients baseline mentation.  EMS reports Alert to place and self  History vertigo  EMS vitals: 189/86b/p 98% RA 88HR

## 2023-07-17 ENCOUNTER — Emergency Department
Admission: EM | Admit: 2023-07-17 | Discharge: 2023-07-17 | Disposition: A | Discharge status: Home / Self Care | Payer: Medicare Other | Attending: Emergency Medicine | Admitting: Emergency Medicine

## 2023-07-17 DIAGNOSIS — N390 Urinary tract infection, site not specified: Secondary | ICD-10-CM

## 2023-07-17 DIAGNOSIS — S0990XA Unspecified injury of head, initial encounter: Secondary | ICD-10-CM

## 2023-07-17 LAB — URINALYSIS, ROUTINE W REFLEX MICROSCOPIC
Bilirubin Urine: NEGATIVE
Glucose, UA: NEGATIVE mg/dL
Hgb urine dipstick: NEGATIVE
Ketones, ur: NEGATIVE mg/dL
Nitrite: NEGATIVE
Protein, ur: 30 mg/dL — AB
Specific Gravity, Urine: 1.015 (ref 1.005–1.030)
WBC, UA: >50 WBC/hpf (ref 0–5)
pH: 5 (ref 5.0–8.0)

## 2023-07-17 MED ORDER — AMOXICILLIN-POT CLAVULANATE 875-125 MG PO TABS: 1.0000 | ORAL_TABLET | Freq: Two times a day (BID) | ORAL | 0 refills | Status: AC

## 2023-07-17 NOTE — ED Notes (Signed)
Pt ambulated with assistance using walker - pt had steady even gait. Pt was confident in her walking ability but states she is fall prone and tends to fall backwards.

## 2023-07-17 NOTE — ED Notes (Signed)
Family to desk again asking about why this was taking so long. Pt family educated on long waits.

## 2023-07-17 NOTE — Discharge Instructions (Addendum)
You were seen in the ER today for evaluation after fall.  We fortunately did not find any serious injuries.  Your urine was concerning for infection.  If sent a prescription for antibiotics to your pharmacy.  Please take these as directed.  Follow with your primary care doctor for further evaluation.  Return to the ER for new or worsening symptoms.

## 2023-07-17 NOTE — ED Provider Notes (Signed)
[facility] Provider Note    Event Date/Time   First MD Initiated Contact with Patient 07/17/23 0133     (approximate)   History   Fall   HPI  Eileen Walsh is a 89 year old female with history of HTN, CAD presenting to the Emergency Department for evaluation after a fall.  Patient was walking in her garage when she fell and hit her head on the concrete.  Initially denied LOC on my evaluation is unsure if she passed out.  Does report some dizziness since her fall, reports a history of vertigo.  Reports and posterior neck pain.  Denies injury to other areas.     Physical Exam   Triage Vital Signs: ED Triage Vitals [07/16/23 1945]  Encounter Vitals Group     BP (!) 155/63     Systolic BP Percentile      Diastolic BP Percentile      Pulse Rate 86     Resp 15     Temp 97.8 F (36.6 C)     Temp Source Oral     SpO2 97 %     Weight      Height      Head Circumference      Peak Flow      Pain Score 5     Pain Loc      Pain Education      Exclude from Growth Chart     Most recent vital signs: Vitals:   07/16/23 1945 07/17/23 0140  BP: (!) 155/63 (!) 156/86  Pulse: 86 81  Resp: 15 16  Temp: 97.8 F (36.6 C)   SpO2: 97% 99%   Nursing notes and vital signs reviewed.  General: Adult female, laying in bed, awake interactive Head: Hematoma over the left posterior scalp without open areas of skin or bleeding Chest: Symmetric chest rise Cardiac: Regular rhythm and rate.  Respiratory: Lungs clear to auscultation Abdomen: Soft, nondistended. No tenderness to palpation.  Pelvis: Stable in AP and lateral compression. No tenderness to palpation. MSK: No deformity to bilateral upper and lower extremity. Full range of motion to bilateral upper lower extremity with no pain. Neuro: Alert, oriented. GCS 15. 5 out of 5 strength in bilateral upper and lower extremities. Normal sensation to light touch in bilateral upper and lower extremity. Skin: No  evidence of burns or lacerations.  ED Results / Procedures / Treatments   Labs (all labs ordered are listed, but only abnormal results are displayed) Labs Reviewed  BASIC METABOLIC PANEL - Abnormal; Notable for the following components:      Result Value   Glucose, Bld 169 (*)    GFR, Estimated 54 (*)    All other components within normal limits  CBC WITH DIFFERENTIAL/PLATELET - Abnormal; Notable for the following components:   WBC 11.3 (*)    Neutro Abs 8.8 (*)    All other components within normal limits  URINALYSIS, ROUTINE W REFLEX MICROSCOPIC - Abnormal; Notable for the following components:   Color, Urine YELLOW (*)    APPearance CLOUDY (*)    Protein, ur 30 (*)    Leukocytes,Ua MODERATE (*)    Bacteria, UA RARE (*)    All other components within normal limits     EKG EKG independently reviewed interpreted by myself (ER attending) demonstrates:  EKG demonstrates normal sinus rhythm at a rate of 81, PR 194, QRS of 80, QTc 418, no acute ST changes  RADIOLOGY Imaging independently reviewed and  interpreted by myself demonstrates:  CT head without acute bleed CT C-spine without acute fracture  PROCEDURES:  Critical Care performed: No  Procedures   MEDICATIONS ORDERED IN ED: Medications - No data to display   IMPRESSION / MDM / ASSESSMENT AND PLAN / ED COURSE  I reviewed the triage vital signs and the nursing notes.  Differential diagnosis includes, but is not limited to, intracranial bleed, skull fracture, spine fracture, no evidence of thoracoabdominal trauma, anemia, electrolyte abnormality, UTI  Patient's presentation is most consistent with acute presentation with potential threat to life or bodily function.  89 year old female presenting with head trauma.  Stable vitals.  Labs with mild leukocytosis.  Urine is concerning for infection with greater than 50 white blood cells and moderate leukocyte esterase.  Patient denies dysuria, but reports change in the  appearance of her urine.  Do think it is reasonable to treat with antibiotics.  Last year, admitted for UTI growing a faecalis, discharged on Augmentin at that time.  Will order prescription for this based on prior culture data.  Unclear syncope, I did discuss possible admission, but patient strongly prefers discharge home.  With age and otherwise reassuring workup, do think discharge with outpatient follow-up and strict return precautions is reasonable.  Patient discharged in stable condition.     FINAL CLINICAL IMPRESSION(S) / ED DIAGNOSES   Final diagnoses:  Closed head injury, initial encounter  Urinary tract infection without hematuria, site unspecified     Rx / DC Orders   ED Discharge Orders          Ordered    amoxicillin-clavulanate (AUGMENTIN) 875-125 MG tablet  2 times daily        07/17/23 0243             Note:  This document was prepared using Dragon voice recognition software and may include unintentional dictation errors.   Trinna Post, MD 07/17/23 [phone]

## 2023-09-30 ENCOUNTER — Other Ambulatory Visit: Payer: Self-pay

## 2023-09-30 ENCOUNTER — Emergency Department

## 2023-09-30 ENCOUNTER — Encounter: Payer: Self-pay | Admitting: Emergency Medicine

## 2023-09-30 ENCOUNTER — Inpatient Hospital Stay
Admission: EM | Admit: 2023-09-30 | Discharge: 2023-10-06 | DRG: 956 | Disposition: A | Discharge status: Skilled Nursing Facility | Source: Skilled Nursing Facility | Attending: Student in an Organized Health Care Education/Training Program | Admitting: Student in an Organized Health Care Education/Training Program

## 2023-09-30 DIAGNOSIS — Z833 Family history of diabetes mellitus: Secondary | ICD-10-CM

## 2023-09-30 DIAGNOSIS — S72011A Unspecified intracapsular fracture of right femur, initial encounter for closed fracture: Principal | ICD-10-CM | POA: Diagnosis present

## 2023-09-30 DIAGNOSIS — I10 Essential (primary) hypertension: Secondary | ICD-10-CM | POA: Diagnosis not present

## 2023-09-30 DIAGNOSIS — I5032 Chronic diastolic (congestive) heart failure: Secondary | ICD-10-CM | POA: Diagnosis present

## 2023-09-30 DIAGNOSIS — D72829 Elevated white blood cell count, unspecified: Secondary | ICD-10-CM | POA: Diagnosis present

## 2023-09-30 DIAGNOSIS — M797 Fibromyalgia: Secondary | ICD-10-CM | POA: Diagnosis present

## 2023-09-30 DIAGNOSIS — E440 Moderate protein-calorie malnutrition: Secondary | ICD-10-CM | POA: Diagnosis present

## 2023-09-30 DIAGNOSIS — F0390 Unspecified dementia without behavioral disturbance: Secondary | ICD-10-CM | POA: Diagnosis present

## 2023-09-30 DIAGNOSIS — I2510 Atherosclerotic heart disease of native coronary artery without angina pectoris: Secondary | ICD-10-CM | POA: Diagnosis present

## 2023-09-30 DIAGNOSIS — M25551 Pain in right hip: Secondary | ICD-10-CM

## 2023-09-30 DIAGNOSIS — E785 Hyperlipidemia, unspecified: Secondary | ICD-10-CM | POA: Diagnosis present

## 2023-09-30 DIAGNOSIS — Z823 Family history of stroke: Secondary | ICD-10-CM

## 2023-09-30 DIAGNOSIS — Z8261 Family history of arthritis: Secondary | ICD-10-CM

## 2023-09-30 DIAGNOSIS — W19XXXA Unspecified fall, initial encounter: Secondary | ICD-10-CM

## 2023-09-30 DIAGNOSIS — Y92009 Unspecified place in unspecified non-institutional (private) residence as the place of occurrence of the external cause: Secondary | ICD-10-CM

## 2023-09-30 DIAGNOSIS — Z882 Allergy status to sulfonamides status: Secondary | ICD-10-CM | POA: Diagnosis not present

## 2023-09-30 DIAGNOSIS — W010XXA Fall on same level from slipping, tripping and stumbling without subsequent striking against object, initial encounter: Secondary | ICD-10-CM | POA: Diagnosis present

## 2023-09-30 DIAGNOSIS — Z818 Family history of other mental and behavioral disorders: Secondary | ICD-10-CM

## 2023-09-30 DIAGNOSIS — Z8249 Family history of ischemic heart disease and other diseases of the circulatory system: Secondary | ICD-10-CM | POA: Diagnosis not present

## 2023-09-30 DIAGNOSIS — N1831 Chronic kidney disease, stage 3a: Secondary | ICD-10-CM | POA: Diagnosis present

## 2023-09-30 DIAGNOSIS — Z886 Allergy status to analgesic agent status: Secondary | ICD-10-CM | POA: Diagnosis not present

## 2023-09-30 DIAGNOSIS — J449 Chronic obstructive pulmonary disease, unspecified: Secondary | ICD-10-CM | POA: Diagnosis present

## 2023-09-30 DIAGNOSIS — I480 Paroxysmal atrial fibrillation: Secondary | ICD-10-CM | POA: Diagnosis present

## 2023-09-30 DIAGNOSIS — Z83438 Family history of other disorder of lipoprotein metabolism and other lipidemia: Secondary | ICD-10-CM | POA: Diagnosis not present

## 2023-09-30 DIAGNOSIS — S72009A Fracture of unspecified part of neck of unspecified femur, initial encounter for closed fracture: Secondary | ICD-10-CM | POA: Diagnosis present

## 2023-09-30 DIAGNOSIS — Y92098 Other place in other non-institutional residence as the place of occurrence of the external cause: Secondary | ICD-10-CM | POA: Diagnosis not present

## 2023-09-30 DIAGNOSIS — Z90710 Acquired absence of both cervix and uterus: Secondary | ICD-10-CM | POA: Diagnosis not present

## 2023-09-30 DIAGNOSIS — I130 Hypertensive heart and chronic kidney disease with heart failure and stage 1 through stage 4 chronic kidney disease, or unspecified chronic kidney disease: Secondary | ICD-10-CM | POA: Diagnosis present

## 2023-09-30 DIAGNOSIS — S72001A Fracture of unspecified part of neck of right femur, initial encounter for closed fracture: Secondary | ICD-10-CM | POA: Diagnosis not present

## 2023-09-30 DIAGNOSIS — S065XAA Traumatic subdural hemorrhage with loss of consciousness status unknown, initial encounter: Secondary | ICD-10-CM | POA: Diagnosis present

## 2023-09-30 DIAGNOSIS — Z6824 Body mass index (BMI) 24.0-24.9, adult: Secondary | ICD-10-CM | POA: Diagnosis not present

## 2023-09-30 DIAGNOSIS — Z79899 Other long term (current) drug therapy: Secondary | ICD-10-CM

## 2023-09-30 HISTORY — DX: Chronic obstructive pulmonary disease, unspecified: J44.9

## 2023-09-30 LAB — CBC
HCT: 37.7 % (ref 36.0–46.0)
Hemoglobin: 13.1 g/dL (ref 12.0–15.0)
MCH: 31.6 pg (ref 26.0–34.0)
MCHC: 34.7 g/dL (ref 30.0–36.0)
MCV: 91.1 fL (ref 80.0–100.0)
Platelets: 250 10*3/uL (ref 150–400)
RBC: 4.14 MIL/uL (ref 3.87–5.11)
RDW: 12.1 % (ref 11.5–15.5)
WBC: 12 10*3/uL — ABNORMAL HIGH (ref 4.0–10.5)
nRBC: 0 % (ref 0.0–0.2)

## 2023-09-30 LAB — BASIC METABOLIC PANEL WITH GFR
Anion gap: 7 (ref 5–15)
BUN: 16 mg/dL (ref 8–23)
CO2: 23 mmol/L (ref 22–32)
Calcium: 8.8 mg/dL — ABNORMAL LOW (ref 8.9–10.3)
Chloride: 103 mmol/L (ref 98–111)
Creatinine, Ser: 0.78 mg/dL (ref 0.44–1.00)
GFR, Estimated: >60 mL/min (ref >60)
Glucose, Bld: 108 mg/dL — ABNORMAL HIGH (ref 70–99)
Potassium: 4.1 mmol/L (ref 3.5–5.1)
Sodium: 133 mmol/L — ABNORMAL LOW (ref 135–145)

## 2023-09-30 LAB — PROTIME-INR
INR: 1.1 (ref 0.8–1.2)
Prothrombin Time: 14.6 s (ref 11.4–15.2)

## 2023-09-30 LAB — APTT: aPTT: 28 s (ref 24–36)

## 2023-09-30 MED ORDER — CALCIUM CARBONATE ANTACID 500 MG PO CHEW: 2.0000 | CHEWABLE_TABLET | ORAL | Status: DC | PRN

## 2023-09-30 MED ORDER — DULOXETINE HCL 30 MG PO CPEP
30.0000 mg | ORAL_CAPSULE | Freq: Every day | ORAL | Status: DC
  Administered 2023-10-01 – 2023-10-06 (×6): 30 mg via ORAL
  Filled 2023-09-30 (×6): qty 1

## 2023-09-30 MED ORDER — AMLODIPINE-OLMESARTAN 10-40 MG PO TABS: 1.0000 | ORAL_TABLET | Freq: Every day | ORAL | Status: DC

## 2023-09-30 MED ORDER — METHOCARBAMOL 500 MG PO TABS
500.0000 mg | ORAL_TABLET | Freq: Three times a day (TID) | ORAL | Status: DC | PRN
  Administered 2023-09-30: 500 mg via ORAL
  Filled 2023-09-30: qty 1

## 2023-09-30 MED ORDER — ACETAMINOPHEN 500 MG PO TABS
1000.0000 mg | ORAL_TABLET | Freq: Once | ORAL | Status: AC
Start: 2023-09-30 — End: 2023-09-30
  Administered 2023-09-30: 1000 mg via ORAL
  Filled 2023-09-30: qty 2

## 2023-09-30 MED ORDER — OXYCODONE-ACETAMINOPHEN 5-325 MG PO TABS
1.0000 | ORAL_TABLET | ORAL | Status: DC | PRN
  Administered 2023-10-01 (×2): 1 via ORAL
  Filled 2023-09-30 (×2): qty 1

## 2023-09-30 MED ORDER — ACETAMINOPHEN 325 MG PO TABS: 650.0000 mg | ORAL_TABLET | Freq: Four times a day (QID) | ORAL | Status: DC | PRN

## 2023-09-30 MED ORDER — IRBESARTAN 150 MG PO TABS
300.0000 mg | ORAL_TABLET | Freq: Every day | ORAL | Status: AC
Start: 2023-10-01 — End: ?
  Administered 2023-10-01 – 2023-10-04 (×4): 300 mg via ORAL
  Filled 2023-09-30 (×5): qty 2

## 2023-09-30 MED ORDER — CEFAZOLIN SODIUM-DEXTROSE 2-4 GM/100ML-% IV SOLN
2.0000 g | Freq: Once | INTRAVENOUS | Status: AC
  Administered 2023-10-01: 2 g via INTRAVENOUS
  Filled 2023-09-30 (×3): qty 100

## 2023-09-30 MED ORDER — CLOBETASOL PROPIONATE 0.05 % EX CREA
1.0000 | TOPICAL_CREAM | Freq: Two times a day (BID) | CUTANEOUS | Status: DC
  Administered 2023-10-01 – 2023-10-06 (×10): 1 via TOPICAL
  Filled 2023-09-30 (×2): qty 15

## 2023-09-30 MED ORDER — AMLODIPINE BESYLATE 10 MG PO TABS
10.0000 mg | ORAL_TABLET | Freq: Every day | ORAL | Status: DC
  Administered 2023-10-01 – 2023-10-03 (×3): 10 mg via ORAL
  Filled 2023-09-30 (×2): qty 1
  Filled 2023-09-30: qty 2

## 2023-09-30 MED ORDER — LIDOCAINE 5 % EX PTCH
1.0000 | MEDICATED_PATCH | CUTANEOUS | Status: DC
  Administered 2023-09-30 – 2023-10-05 (×6): 1 via TRANSDERMAL
  Filled 2023-09-30 (×6): qty 1

## 2023-09-30 MED ORDER — LUTEIN 6 MG PO CAPS: ORAL_CAPSULE | Freq: Every day | ORAL | Status: DC

## 2023-09-30 MED ORDER — HYDRALAZINE HCL 20 MG/ML IJ SOLN
5.0000 mg | INTRAMUSCULAR | Status: DC | PRN
  Administered 2023-10-04 – 2023-10-05 (×2): 5 mg via INTRAVENOUS
  Filled 2023-09-30 (×2): qty 1

## 2023-09-30 MED ORDER — SENNOSIDES-DOCUSATE SODIUM 8.6-50 MG PO TABS: 1.0000 | ORAL_TABLET | Freq: Every evening | ORAL | Status: DC | PRN

## 2023-09-30 MED ORDER — SENNOSIDES-DOCUSATE SODIUM 8.6-50 MG PO TABS
1.0000 | ORAL_TABLET | Freq: Two times a day (BID) | ORAL | Status: DC
  Administered 2023-09-30: 1 via ORAL
  Filled 2023-09-30: qty 1

## 2023-09-30 MED ORDER — OXYCODONE HCL 5 MG PO TABS
2.5000 mg | ORAL_TABLET | Freq: Once | ORAL | Status: AC
  Administered 2023-09-30: 2.5 mg via ORAL
  Filled 2023-09-30: qty 1

## 2023-09-30 MED ORDER — ONDANSETRON HCL 4 MG/2ML IJ SOLN: 4.0000 mg | Freq: Three times a day (TID) | INTRAMUSCULAR | Status: DC | PRN

## 2023-09-30 MED ORDER — MORPHINE SULFATE (PF) 2 MG/ML IV SOLN
1.0000 mg | INTRAVENOUS | Status: DC | PRN
  Administered 2023-10-01: 1 mg via INTRAVENOUS
  Filled 2023-09-30: qty 1

## 2023-09-30 NOTE — H&P (Signed)
 History and Physical    Eileen Walsh GNF:[id number] DOB: August 05, 1928 DOA: 09/30/2023  Referring MD/NP/PA:   PCP: [facility]   Patient coming from:  The patient is coming from SNF   Chief Complaint: fall and right hip and right knee pain  HPI: Eileen Walsh is a 89 y.o. female with medical history significant of HTN, HLD, CAD, dCHf, GERD, Depression, fibromyalgia, SDH, neuropathy, recent right 5th toe fracture, who presents with fall and right hip and right knee pain.  Pt states that she lost her balance and fell when she was trying to sit in chair. She injured her right hip and right knee, causing pain.  Her right hip pain is constant, severe, sharp, radiating to groin area, aggravated by movement.  Patient denies headache or neck pain.  No chest pain, cough, SOB.  Patient states that she has intermittent constipation and diarrhea recently.  No nausea, vomiting, abdominal pain.  No symptoms of UTI.  Of note, patient has a recent right fifth toe fracture, with pain in the right fifth toe and fourth toe.  Data reviewed independently and ED Course: pt was found to have WBC 12.0, pending BMP, temperature normal, blood pressure 130/72, heart rate 80, RR 20, oxygen sat 95% on room air.  CT of head showed small subdural hematoma which is stable on repeat CT scan of head 6 hours later. CT of C-spine negative for acute injury, but showed degenerative disc disease.  X-ray of right knee is negative for acute fracture.  X-ray of right hip showed showed possible fracture which is confirmed by CT scan. Patient is admitted to PCU as inpatient.  Dr. Lydia Sams of Ortho and Dr. Felipe Horton of neurosurgery were consulted.   Initial CT-head: Small focus of subdural hemorrhage over the right frontal convexity measuring up to 3.6 mm in maximum thickness. No significant mass effect. No midline shift.  The repeated CT of head at 6 hours alter showed: Unchanged small subdural hemorrhage along the right  cerebral convexity without substantial mass effect.  CT-right hip: Right subcapital femoral neck fracture.  X-ray of right foot 1. Angulated comminuted fracture of the distal diaphysis and neck of the fifth metatarsal. This is age indeterminate and may be acute. 2. Healed distal fourth metatarsal fracture. 3. Moderate degenerative changes at the first metatarsophalangeal joint with mild hallux valgus.    EKG: Not done in ED, will get one.    Review of Systems:   General: no fevers, chills, no body weight gain, has fatigue HEENT: no blurry vision, hearing changes or sore throat Respiratory: no dyspnea, coughing, wheezing CV: no chest pain, no palpitations GI: no nausea, vomiting, abdominal pain, diarrhea, constipation GU: no dysuria, burning on urination, increased urinary frequency, hematuria  Ext: no leg edema Neuro: no unilateral weakness, numbness, or tingling, no vision change or hearing loss. Has fall Skin: no rash, no skin tear. MSK: has pain in right hip, right knee and right foot Heme: No easy bruising.  Travel history: No recent long distant travel.   Allergy:  Allergies  Allergen Reactions   Aspirin  Other (See Comments)   Sulfa Antibiotics Hives and Other (See Comments)    Past Medical History:  Diagnosis Date   Arthritis    COPD (chronic obstructive pulmonary disease) (HCC)    Fibromyalgia    Hypertension    Macular degeneration     Past Surgical History:  Procedure Laterality Date   ABDOMINAL HYSTERECTOMY  1970   BREAST SURGERY  1985  Breast Bx   CHOLECYSTECTOMY      Social History:  reports that she has never smoked. She has never used smokeless tobacco. She reports that she does not currently use alcohol. She reports that she does not use drugs.  Family History:  Family History  Problem Relation Age of Onset   Hypertension Mother    Arthritis Mother    Hyperlipidemia Mother    Heart disease Father    Hypertension Brother    Stroke  Brother    Diabetes Brother    Arthritis Brother    Heart disease Brother    Anxiety disorder Brother        severe. had nervous breakdown     Prior to Admission medications   Medication Sig Start Date End Date Taking? Authorizing Provider  alum & mag hydroxide-simeth (MAALOX/MYLANTA) 200-200-20 MG/5ML suspension Take 30 mLs by mouth every 6 (six) hours as needed for indigestion or heartburn. 12/10/21   Hoyt Macleod, MD  amLODipine -olmesartan  (AZOR ) 5-40 MG tablet Take 1 tablet by mouth daily. 08/07/21   Nikki Barters, MD  clobetasol  (TEMOVATE ) 0.05 % external solution Mix into CeraVe as instructed and apply to aa's QD-BID PRN. Avoid applying to face, groin, and axilla. Use as directed. Long-term use can cause thinning of the skin. 03/01/23   Harris Liming, MD  DULoxetine  (CYMBALTA ) 30 MG capsule Take 1 capsule (30 mg total) by mouth daily. 02/04/22   Nikki Barters, MD  DULoxetine  (CYMBALTA ) 30 MG capsule Take 1 capsule (30 mg total) by mouth daily. Patient not taking: Reported on 03/01/2023 02/04/22   Nikki Barters, MD  ketoconazole  (NIZORAL ) 2 % cream Apply topically daily. Patient not taking: Reported on 03/01/2023 11/13/21   [provider]  Multiple Vitamin (MULTIVITAMIN WITH MINERALS) TABS tablet Take 1 tablet by mouth daily.    [provider]  omeprazole  (PRILOSEC) 20 MG capsule TAKE 1 CAPSULE EVERY       MORNING 07/28/21   Nikki Barters, MD    Physical Exam: Vitals:   10/01/23 0030 10/01/23 0045 10/01/23 0100 10/01/23 0200  BP:   (!) 160/59 (!) 151/59  Pulse: 87 84 86   Resp:      Temp:      TempSrc:      SpO2: 97% 98% 95%   Weight:      Height:       General: Not in acute distress HEENT:       Eyes: PERRL, EOMI, no jaundice       ENT: No discharge from the ears and nose, no pharynx injection, no tonsillar enlargement.        Neck: No JVD, no bruit, no mass felt. Heme: No neck lymph node enlargement. Cardiac: S1/S2, RRR, No murmurs,  No gallops or rubs. Respiratory: No rales, wheezing, rhonchi or rubs. GI: Soft, nondistended, nontender, no rebound pain, no organomegaly, BS present. GU: No hematuria Ext: No pitting leg edema bilaterally. 1+DP/PT pulse bilaterally. Musculoskeletal: has tenderness in right hip, right knee and right foot 4th and 5th toe Skin: No rashes.  Neuro: Alert, oriented X3, cranial nerves II-XII grossly intact, moves all extremities Psych: Patient is not psychotic, no suicidal or hemocidal ideation.  Labs on Admission: I have personally reviewed following labs and imaging studies  CBC: Recent Labs  Lab 09/30/23 2318  WBC 12.0*  HGB 13.1  HCT 37.7  MCV 91.1  PLT 250   Basic Metabolic Panel: Recent Labs  Lab 09/30/23 2318  NA 133*  K 4.1  CL 103  CO2 23  GLUCOSE 108*  BUN 16  CREATININE 0.78  CALCIUM  8.8*   GFR: Estimated Creatinine Clearance: 43.6 mL/min (by C-G formula based on SCr of 0.78 mg/dL). Liver Function Tests: No results for input(s): "AST", "ALT", "ALKPHOS", "BILITOT", "PROT", "ALBUMIN" in the last 168 hours. No results for input(s): "LIPASE", "AMYLASE" in the last 168 hours. No results for input(s): "AMMONIA" in the last 168 hours. Coagulation Profile: Recent Labs  Lab 09/30/23 2318  INR 1.1   Cardiac Enzymes: No results for input(s): "CKTOTAL", "CKMB", "CKMBINDEX", "TROPONINI" in the last 168 hours. BNP (last 3 results) No results for input(s): "PROBNP" in the last 8760 hours. HbA1C: No results for input(s): "HGBA1C" in the last 72 hours. CBG: No results for input(s): "GLUCAP" in the last 168 hours. Lipid Profile: No results for input(s): "CHOL", "HDL", "LDLCALC", "TRIG", "CHOLHDL", "LDLDIRECT" in the last 72 hours. Thyroid  Function Tests: No results for input(s): "TSH", "T4TOTAL", "FREET4", "T3FREE", "THYROIDAB" in the last 72 hours. Anemia Panel: No results for input(s): "VITAMINB12", "FOLATE", "FERRITIN", "TIBC", "IRON", "RETICCTPCT" in the last 72  hours. Urine analysis:    Component Value Date/Time   COLORURINE YELLOW (A) 07/16/2023 1950   APPEARANCEUR CLOUDY (A) 07/16/2023 1950   LABSPEC 1.015 07/16/2023 1950   PHURINE 5.0 07/16/2023 1950   GLUCOSEU NEGATIVE 07/16/2023 1950   HGBUR NEGATIVE 07/16/2023 1950   BILIRUBINUR NEGATIVE 07/16/2023 1950   KETONESUR NEGATIVE 07/16/2023 1950   PROTEINUR 30 (A) 07/16/2023 1950   NITRITE NEGATIVE 07/16/2023 1950   LEUKOCYTESUR MODERATE (A) 07/16/2023 1950   Sepsis Labs: @LABRCNTIP (procalcitonin:4,lacticidven:4) )No results found for this or any previous visit (from the past 240 hours).   Radiological Exams on Admission:   Assessment/Plan Principal Problem:   Closed right hip fracture (HCC) Active Problems:   Fall at home, initial encounter   SDH (subdural hematoma) (HCC)   Essential hypertension   Chronic diastolic CHF (congestive heart failure) (HCC)   CAD (coronary artery disease)   Chronic kidney disease, stage 3a (HCC)   AF (paroxysmal atrial fibrillation) (HCC)   Assessment and Plan:   Closed right hip fracture [facility]):  As evidenced by CT scan, pt has right subcapital femoral neck fracture. Dr. Lydia Sams is planning to do surgery morning  - will admit to PCU as inpt - Pain control: prn morphine , percocet and tyleno - When necessary Zofran  for nausea - Robaxin  for muscle spasm - Lidoderm  patch for pain - type and cross - INR/PTT - PT/OT when able to (not ordered now)  Leukocytosis: WBC  12.0. Likely due to stress-induced demargination. Patient does not have signs of infection. -Follow-up CBC  Fall at home, initial encounter -PT/OT when able to  SDH (subdural hematoma) (HCC): pt has small SDH which is stable on repeated CT scan. - Consulted Dr. Felipe Horton of neurosurgery --IV hydralazine  as needed for SBP> 160  Essential hypertension -IV hydralazine  as needed for SBP> 160 -continue home Azor   Chronic diastolic CHF (congestive heart failure) (HCC): 2D echo on  12/08/2021 showed EF of 60 to 65% with grade 1 diastolic dysfunction.  Patient does not have leg edema JVD.  No SOB.  CHF is compensated. - Check BNP  CAD (coronary artery disease): no CP. Pt is not taking ASA or statin - Observe closely  Chronic kidney disease, stage 3a (HCC) -Follow-up BMP  History of AF (paroxysmal atrial fibrillation) (HCC): Heart rate 80. - Tele monitoring   Right 5th toe fracture: -pain control as above  Perioperative Cardiac Risk: pt has multiple comorbidities as listed above, including CAD, dCHF and A fib.  Patient does not have chest pain, shortness of breath, palpitation, leg edema.  No signs of acute CHF exacerbation currently.  Her heart rate is 80s. At this time point, no further work up is needed. Patient's GUPTA score perioperative myocardial infarction or cardaic arrest is 2.84%.   The only issue which I am not sure now is her SDH. Pt has a small SDH which is stable on repeated CT scan.  Mental status normal.  I sent a security message to Dr. Felipe Horton of neurosurgery, still pending his response.       DVT ppx: SCD  Code Status: Full code     Family Communication:     not done, no family member is at bed side.   Disposition Plan:  Anticipate discharge to SNF  Consults called:  Dr. Lydia Sams of Ortho and Dr. Felipe Horton of neurosurgery were consulted.  Admission status and Level of care: Progressive:   as inpt        Dispo: The patient is from: SNF              Anticipated d/c is to: SNF              Anticipated d/c date is: 2 days              Patient currently is not medically stable to d/c.    Severity of Illness:  The appropriate patient status for this patient is INPATIENT. Inpatient status is judged to be reasonable and necessary in order to provide the required intensity of service to ensure the patient's safety. The patient's presenting symptoms, physical exam findings, and initial radiographic and laboratory data in the context of their  chronic comorbidities is felt to place them at high risk for further clinical deterioration. Furthermore, it is not anticipated that the patient will be medically stable for discharge from the hospital within 2 midnights of admission.   * I certify that at the point of admission it is my clinical judgment that the patient will require inpatient hospital care spanning beyond 2 midnights from the point of admission due to high intensity of service, high risk for further deterioration and high frequency of surveillance required.*       Date of Service 10/01/2023    Fidencio Hue [facility]   If 7PM-7AM, please contact night-coverage [email or web address] 10/01/2023, 2:07 AM

## 2023-09-30 NOTE — ED Notes (Signed)
 Pt expressed having soiled on self. Pt was cleaned and changed with new brief

## 2023-09-30 NOTE — ED Triage Notes (Signed)
 First Nurse Note: Patient to ED via [facility] from [facility] for a fall while trying to pivot to sit in chair. C/o right hip and knee pain. No shortening or rotation. Has boot on for broke toe.   154/72 98% RA 78 HR

## 2023-09-30 NOTE — Progress Notes (Signed)
 Full consult note to follow tomorrow.  Called by ED staff. Imaging reviewed.  - Plan for surgery tomorrow for percutaneous pinning, likely afternoon. Surgery plan and verbal consent over telephone obtained from patient's daughter, [name], at [phone]. - NPO after midnight - Hold anticoagulation - Admit to Hospitalist team.

## 2023-09-30 NOTE — ED Triage Notes (Signed)
 Pt here after a fall, pt lost her balance and fell. Pt unsure uf she hit her head. Pt c/o right hip and right knee pain. Pt currently has a right foot fracture.

## 2023-09-30 NOTE — ED Provider Notes (Signed)
 [facility] Emergency Department Provider Note ____________________________________________  Time seen: Approximately 2:15 PM  I have reviewed the triage vital signs and the nursing notes.   HISTORY  Chief Complaint Knee Pain and Hip Pain    HPI WINNA GOLLA is a 89 y.o. female with history of hypertension, A-fib with RVR, CAD, hypertension, subdural hematoma and as listed in EMR who presents to the emergency department for evaluation and treatment of right hip and right knee pain.  She is a resident at [facility] and was trying to pivot to sit in a chair.  She lost her balance and fell.  Unsure if she hit her head. She is currently in a postop shoe after toe fracture.     PHYSICAL EXAM:  VITAL SIGNS: ED Triage Vitals  Encounter Vitals Group     BP 09/30/23 1255 139/71     Systolic BP Percentile --      Diastolic BP Percentile --      Pulse Rate 09/30/23 1255 85     Resp 09/30/23 1255 16     Temp 09/30/23 1255 98.4 F (36.9 C)     Temp Source 09/30/23 1255 Oral     SpO2 09/30/23 1255 96 %     Weight 09/30/23 1257 158 lb 1.1 oz (71.7 kg)     Height 09/30/23 1257 5\' 7"  (1.702 m)     Head Circumference --      Peak Flow --      Pain Score 09/30/23 1257 7     Pain Loc --      Pain Education --      Exclude from Growth Chart --     Constitutional: Alert and oriented. Well appearing and in no acute distress. Eyes: Conjunctivae are clear without discharge or drainage Head: Atraumatic Neck: Supple. No focal midline tenderness. Respiratory: No cough. Respirations are even and unlabored. Musculoskeletal: No pain in the upper extremities with passive range of motion.  Pain in the upper leg induced with flexion of the right knee.  No swelling of the right knee.  No ankle swelling.  Postop shoe in place on the right foot. Neurologic: No focal neurodeficits.  Dementia at baseline. Skin: No open wounds over the Psychiatric: Affect and behavior are  appropriate.  ____________________________________________   LABS (all labs ordered are listed, but only abnormal results are displayed)  Labs Reviewed - No data to display ____________________________________________  RADIOLOGY  3.62mm subdural hematoma right frontal convexity. Cervical spine negative for acute concerns.   X-ray of the right knee negative for acute concerns.  X-ray of the right hip shows irregularity along the subcapital right femoral neck with a step-off laterally on the frontal view of the hip concerning for nondisplaced fracture.  I, Kem Boroughs, personally viewed and evaluated these images (plain radiographs) as part of my medical decision making, as well as reviewing the written report by the radiologist.  ____________________________________________   PROCEDURES  Procedures  ____________________________________________   INITIAL IMPRESSION / ASSESSMENT AND PLAN / ED COURSE  ABEEHA TWIST is a 89 y.o. who presents to the emergency department for evaluation after a mechanical, nonsyncopal fall while trying to pivot from her walker to the chair.  See HPI for further details. Patient has dementia at baseline. No cognitive change per family. Patient complaining of pain on her "entire right side." Tylenol ordered.  Differential diagnosis includes, but is not limited to hip fracture, knee strain, head injury, cervical vertebral injury  Patient's presentation is  most consistent with acute illness / injury with system symptoms.  Exam is reassuring with the exception of pain in the upper leg with flexion of the hip and knee.  No shortening or rotation noted.  Radiology report of imaging is pending.  Head CT shows a 3.6 mm subdural hematoma on the frontal convexity.  Consulted with neurosurgery, Dr. Felipe Horton who recommends repeat CT in 6 hours.  X-ray of the right hip is concerning for possible nondisplaced hip fracture. Patient refused to go unless she had  something else for pain. Oxycodone IR ordered. Radiology report of the CT of the hip is pending.  Care transferred to ED attending, Dr. Vicenta Graft at change of shift. He will follow up on results of CT hip and repeat head CT.  Medications  acetaminophen (TYLENOL) tablet 1,000 mg (1,000 mg Oral Given 09/30/23 1517)  oxyCODONE (Oxy IR/ROXICODONE) immediate release tablet 2.5 mg (2.5 mg Oral Given 09/30/23 1711)    Pertinent labs & imaging results that were available during my care of the patient were reviewed by me and considered in my medical decision making (see chart for details).   _________________________________________   FINAL CLINICAL IMPRESSION(S) / ED DIAGNOSES  Final diagnoses:  Subdural hematoma (HCC)  Acute hip pain, right     ED Discharge Orders     None        If controlled substance prescribed during this visit, 12 month history viewed on the [facility] prior to issuing an initial prescription for Schedule II or III opiod.    Sherryle Don, FNP 09/30/23 5784    Jacquie Maudlin, MD 09/30/23 2236

## 2023-10-01 ENCOUNTER — Inpatient Hospital Stay

## 2023-10-01 ENCOUNTER — Encounter: Payer: Self-pay | Admitting: Internal Medicine

## 2023-10-01 ENCOUNTER — Other Ambulatory Visit: Payer: Self-pay

## 2023-10-01 ENCOUNTER — Encounter
Admission: EM | Disposition: A | Discharge status: Skilled Nursing Facility | Payer: Self-pay | Source: Skilled Nursing Facility | Attending: Osteopathic Medicine

## 2023-10-01 ENCOUNTER — Inpatient Hospital Stay: Admitting: Certified Registered"

## 2023-10-01 DIAGNOSIS — W19XXXA Unspecified fall, initial encounter: Secondary | ICD-10-CM | POA: Diagnosis not present

## 2023-10-01 DIAGNOSIS — S065XAA Traumatic subdural hemorrhage with loss of consciousness status unknown, initial encounter: Secondary | ICD-10-CM | POA: Diagnosis not present

## 2023-10-01 DIAGNOSIS — S72009A Fracture of unspecified part of neck of unspecified femur, initial encounter for closed fracture: Secondary | ICD-10-CM | POA: Diagnosis present

## 2023-10-01 DIAGNOSIS — S72001A Fracture of unspecified part of neck of right femur, initial encounter for closed fracture: Secondary | ICD-10-CM | POA: Diagnosis not present

## 2023-10-01 HISTORY — PX: HIP PINNING,CANNULATED: SHX1758

## 2023-10-01 LAB — BASIC METABOLIC PANEL WITH GFR
Anion gap: 7 (ref 5–15)
BUN: 14 mg/dL (ref 8–23)
CO2: 23 mmol/L (ref 22–32)
Calcium: 8.6 mg/dL — ABNORMAL LOW (ref 8.9–10.3)
Chloride: 101 mmol/L (ref 98–111)
Creatinine, Ser: 0.82 mg/dL (ref 0.44–1.00)
GFR, Estimated: >60 mL/min (ref >60)
Glucose, Bld: 102 mg/dL — ABNORMAL HIGH (ref 70–99)
Potassium: 4 mmol/L (ref 3.5–5.1)
Sodium: 131 mmol/L — ABNORMAL LOW (ref 135–145)

## 2023-10-01 LAB — TYPE AND SCREEN
ABO/RH(D): O POS
Antibody Screen: NEGATIVE

## 2023-10-01 LAB — BRAIN NATRIURETIC PEPTIDE: B Natriuretic Peptide: 24.8 pg/mL (ref 0.0–100.0)

## 2023-10-01 LAB — CBC
HCT: 38.2 % (ref 36.0–46.0)
Hemoglobin: 13.5 g/dL (ref 12.0–15.0)
MCH: 32.1 pg (ref 26.0–34.0)
MCHC: 35.3 g/dL (ref 30.0–36.0)
MCV: 91 fL (ref 80.0–100.0)
Platelets: 242 10*3/uL (ref 150–400)
RBC: 4.2 MIL/uL (ref 3.87–5.11)
RDW: 11.9 % (ref 11.5–15.5)
WBC: 10.2 10*3/uL (ref 4.0–10.5)
nRBC: 0 % (ref 0.0–0.2)

## 2023-10-01 SURGERY — FIXATION, FEMUR, NECK, PERCUTANEOUS, USING SCREW
Anesthesia: General | Site: Hip | Laterality: Right

## 2023-10-01 MED ORDER — ACETAMINOPHEN 10 MG/ML IV SOLN
INTRAVENOUS | Status: DC | PRN
  Administered 2023-10-01: 700 mg via INTRAVENOUS

## 2023-10-01 MED ORDER — FLEET ENEMA RE ENEM: 1.0000 | ENEMA | Freq: Once | RECTAL | Status: DC | PRN

## 2023-10-01 MED ORDER — 0.9 % SODIUM CHLORIDE (POUR BTL) OPTIME
TOPICAL | Status: DC | PRN
  Administered 2023-10-01: 500 mL

## 2023-10-01 MED ORDER — TRAMADOL HCL 50 MG PO TABS
50.0000 mg | ORAL_TABLET | Freq: Four times a day (QID) | ORAL | Status: DC | PRN
  Administered 2023-10-04: 50 mg via ORAL
  Filled 2023-10-01: qty 1

## 2023-10-01 MED ORDER — METHOCARBAMOL 1000 MG/10ML IJ SOLN: 500.0000 mg | Freq: Four times a day (QID) | INTRAMUSCULAR | Status: DC | PRN

## 2023-10-01 MED ORDER — FENTANYL CITRATE (PF) 100 MCG/2ML IJ SOLN
INTRAMUSCULAR | Status: AC
  Filled 2023-10-01: qty 2

## 2023-10-01 MED ORDER — ENOXAPARIN SODIUM 40 MG/0.4ML IJ SOSY
40.0000 mg | PREFILLED_SYRINGE | INTRAMUSCULAR | Status: DC
  Administered 2023-10-02 – 2023-10-03 (×2): 40 mg via SUBCUTANEOUS
  Filled 2023-10-01 (×2): qty 0.4

## 2023-10-01 MED ORDER — LIDOCAINE HCL (PF) 2 % IJ SOLN
INTRAMUSCULAR | Status: AC
  Filled 2023-10-01: qty 5

## 2023-10-01 MED ORDER — ENSURE ENLIVE PO LIQD
237.0000 mL | Freq: Two times a day (BID) | ORAL | Status: DC
  Administered 2023-10-02 – 2023-10-05 (×3): 237 mL via ORAL

## 2023-10-01 MED ORDER — ONDANSETRON HCL 4 MG/2ML IJ SOLN: 4.0000 mg | Freq: Four times a day (QID) | INTRAMUSCULAR | Status: DC | PRN

## 2023-10-01 MED ORDER — PHENYLEPHRINE 80 MCG/ML (10ML) SYRINGE FOR IV PUSH (FOR BLOOD PRESSURE SUPPORT)
PREFILLED_SYRINGE | INTRAVENOUS | Status: DC | PRN
  Administered 2023-10-01 (×2): 80 ug via INTRAVENOUS

## 2023-10-01 MED ORDER — FENTANYL CITRATE (PF) 100 MCG/2ML IJ SOLN
INTRAMUSCULAR | Status: DC | PRN
  Administered 2023-10-01 (×2): 25 ug via INTRAVENOUS

## 2023-10-01 MED ORDER — FENTANYL CITRATE (PF) 100 MCG/2ML IJ SOLN: 25.0000 ug | INTRAMUSCULAR | Status: DC | PRN

## 2023-10-01 MED ORDER — ACETAMINOPHEN 10 MG/ML IV SOLN: 1000.0000 mg | Freq: Once | INTRAVENOUS | Status: DC | PRN

## 2023-10-01 MED ORDER — OXYCODONE HCL 5 MG PO TABS
2.5000 mg | ORAL_TABLET | ORAL | Status: DC | PRN
  Administered 2023-10-03: 5 mg via ORAL
  Filled 2023-10-01: qty 1

## 2023-10-01 MED ORDER — OXYCODONE HCL 5 MG PO TABS
5.0000 mg | ORAL_TABLET | ORAL | Status: DC | PRN
  Administered 2023-10-03 – 2023-10-04 (×3): 5 mg via ORAL
  Administered 2023-10-04: 10 mg via ORAL
  Filled 2023-10-01 (×3): qty 1
  Filled 2023-10-01: qty 2

## 2023-10-01 MED ORDER — PROPOFOL 10 MG/ML IV BOLUS
INTRAVENOUS | Status: AC
  Filled 2023-10-01: qty 20

## 2023-10-01 MED ORDER — DEXAMETHASONE SODIUM PHOSPHATE 10 MG/ML IJ SOLN
INTRAMUSCULAR | Status: AC
  Filled 2023-10-01: qty 1

## 2023-10-01 MED ORDER — METOCLOPRAMIDE HCL 5 MG PO TABS: 5.0000 mg | ORAL_TABLET | Freq: Three times a day (TID) | ORAL | Status: DC | PRN

## 2023-10-01 MED ORDER — METOCLOPRAMIDE HCL 5 MG/ML IJ SOLN: 5.0000 mg | Freq: Three times a day (TID) | INTRAMUSCULAR | Status: DC | PRN

## 2023-10-01 MED ORDER — ADULT MULTIVITAMIN W/MINERALS CH
1.0000 | ORAL_TABLET | Freq: Every day | ORAL | Status: DC
  Administered 2023-10-02 – 2023-10-06 (×5): 1 via ORAL
  Filled 2023-10-01 (×5): qty 1

## 2023-10-01 MED ORDER — BUPIVACAINE LIPOSOME 1.3 % IJ SUSP
INTRAMUSCULAR | Status: AC
  Filled 2023-10-01: qty 20

## 2023-10-01 MED ORDER — BUPIVACAINE HCL (PF) 0.5 % IJ SOLN
INTRAMUSCULAR | Status: AC
  Filled 2023-10-01: qty 30

## 2023-10-01 MED ORDER — PHENYLEPHRINE 80 MCG/ML (10ML) SYRINGE FOR IV PUSH (FOR BLOOD PRESSURE SUPPORT)
PREFILLED_SYRINGE | INTRAVENOUS | Status: AC
  Filled 2023-10-01: qty 10

## 2023-10-01 MED ORDER — ROCURONIUM BROMIDE 10 MG/ML (PF) SYRINGE
PREFILLED_SYRINGE | INTRAVENOUS | Status: AC
  Filled 2023-10-01: qty 10

## 2023-10-01 MED ORDER — SENNOSIDES-DOCUSATE SODIUM 8.6-50 MG PO TABS
1.0000 | ORAL_TABLET | Freq: Every evening | ORAL | Status: DC | PRN
  Administered 2023-10-02: 1 via ORAL
  Filled 2023-10-01: qty 1

## 2023-10-01 MED ORDER — ACETAMINOPHEN 500 MG PO TABS
1000.0000 mg | ORAL_TABLET | Freq: Three times a day (TID) | ORAL | Status: DC
  Administered 2023-10-01 – 2023-10-06 (×12): 1000 mg via ORAL
  Filled 2023-10-01 (×12): qty 2

## 2023-10-01 MED ORDER — DEXAMETHASONE SODIUM PHOSPHATE 10 MG/ML IJ SOLN
INTRAMUSCULAR | Status: DC | PRN
  Administered 2023-10-01: 5 mg via INTRAVENOUS

## 2023-10-01 MED ORDER — LACTATED RINGERS IV SOLN: INTRAVENOUS | Status: DC

## 2023-10-01 MED ORDER — METHOCARBAMOL 500 MG PO TABS
500.0000 mg | ORAL_TABLET | Freq: Four times a day (QID) | ORAL | Status: DC | PRN
  Administered 2023-10-03 – 2023-10-06 (×6): 500 mg via ORAL
  Filled 2023-10-01 (×6): qty 1

## 2023-10-01 MED ORDER — BISACODYL 10 MG RE SUPP: 10.0000 mg | Freq: Every day | RECTAL | Status: DC | PRN

## 2023-10-01 MED ORDER — PROPOFOL 10 MG/ML IV BOLUS
INTRAVENOUS | Status: DC | PRN
  Administered 2023-10-01: 60 mg via INTRAVENOUS

## 2023-10-01 MED ORDER — ROCURONIUM BROMIDE 100 MG/10ML IV SOLN
INTRAVENOUS | Status: DC | PRN
  Administered 2023-10-01: 40 mg via INTRAVENOUS

## 2023-10-01 MED ORDER — ACETAMINOPHEN 10 MG/ML IV SOLN
INTRAVENOUS | Status: AC
  Filled 2023-10-01: qty 100

## 2023-10-01 MED ORDER — NEOMYCIN-POLYMYXIN B GU 40-200000 IR SOLN
Status: AC
  Filled 2023-10-01: qty 1

## 2023-10-01 MED ORDER — LIDOCAINE HCL (CARDIAC) PF 100 MG/5ML IV SOSY
PREFILLED_SYRINGE | INTRAVENOUS | Status: DC | PRN
  Administered 2023-10-01: 100 mg via INTRAVENOUS

## 2023-10-01 MED ORDER — LACTATED RINGERS IV SOLN: INTRAVENOUS | Status: DC | PRN

## 2023-10-01 MED ORDER — DOCUSATE SODIUM 100 MG PO CAPS
100.0000 mg | ORAL_CAPSULE | Freq: Two times a day (BID) | ORAL | Status: DC
  Administered 2023-10-01 – 2023-10-06 (×8): 100 mg via ORAL
  Filled 2023-10-01 (×9): qty 1

## 2023-10-01 MED ORDER — ONDANSETRON HCL 4 MG/2ML IJ SOLN
INTRAMUSCULAR | Status: AC
  Filled 2023-10-01: qty 2

## 2023-10-01 MED ORDER — SUGAMMADEX SODIUM 200 MG/2ML IV SOLN
INTRAVENOUS | Status: DC | PRN
  Administered 2023-10-01: 140 mg via INTRAVENOUS

## 2023-10-01 MED ORDER — OXYCODONE HCL 5 MG/5ML PO SOLN: 5.0000 mg | Freq: Once | ORAL | Status: DC | PRN

## 2023-10-01 MED ORDER — SODIUM CHLORIDE (PF) 0.9 % IJ SOLN
INTRAMUSCULAR | Status: AC
  Filled 2023-10-01: qty 50

## 2023-10-01 MED ORDER — KETOROLAC TROMETHAMINE 15 MG/ML IJ SOLN
7.5000 mg | Freq: Four times a day (QID) | INTRAMUSCULAR | Status: AC
  Administered 2023-10-01 – 2023-10-02 (×4): 7.5 mg via INTRAVENOUS
  Filled 2023-10-01 (×4): qty 1

## 2023-10-01 MED ORDER — CEFAZOLIN SODIUM-DEXTROSE 2-4 GM/100ML-% IV SOLN
2.0000 g | Freq: Four times a day (QID) | INTRAVENOUS | Status: AC
  Administered 2023-10-01 – 2023-10-02 (×3): 2 g via INTRAVENOUS
  Filled 2023-10-01 (×3): qty 100

## 2023-10-01 MED ORDER — ONDANSETRON HCL 4 MG PO TABS: 4.0000 mg | ORAL_TABLET | Freq: Four times a day (QID) | ORAL | Status: DC | PRN

## 2023-10-01 MED ORDER — ONDANSETRON HCL 4 MG/2ML IJ SOLN
INTRAMUSCULAR | Status: DC | PRN
  Administered 2023-10-01: 4 mg via INTRAVENOUS

## 2023-10-01 MED ORDER — BUPIVACAINE LIPOSOME 1.3 % IJ SUSP
INTRAMUSCULAR | Status: DC | PRN
  Administered 2023-10-01: 30 mL

## 2023-10-01 MED ORDER — OXYCODONE HCL 5 MG PO TABS: 5.0000 mg | ORAL_TABLET | Freq: Once | ORAL | Status: DC | PRN

## 2023-10-01 MED ORDER — HYDROMORPHONE HCL 1 MG/ML IJ SOLN
0.2000 mg | INTRAMUSCULAR | Status: DC | PRN
Start: 2023-10-01 — End: 2023-10-05
  Administered 2023-10-04: 0.2 mg via INTRAVENOUS
  Filled 2023-10-01: qty 0.5

## 2023-10-01 SURGICAL SUPPLY — 39 items
BIT DRILL 4.9 CANNULATED (BIT) ×1 IMPLANT
BIT DRILL CANN QC 4.9 LRG (BIT) IMPLANT
BIT DRILL CANNULATED 4.9 (BIT) ×1 IMPLANT
BLADE SURG 15 STRL LF DISP TIS (BLADE) ×1 IMPLANT
CHLORAPREP W/TINT 26 (MISCELLANEOUS) ×1 IMPLANT
DRAPE SHEET LG 3/4 BI-LAMINATE (DRAPES) ×1 IMPLANT
DRAPE SURG 17X11 SM STRL (DRAPES) ×2 IMPLANT
DRAPE U-SHAPE 47X51 STRL (DRAPES) ×2 IMPLANT
DRSG OPSITE POSTOP 3X4 (GAUZE/BANDAGES/DRESSINGS) ×1 IMPLANT
ELECT REM PT RETURN 9FT ADLT (ELECTROSURGICAL) ×1 IMPLANT
ELECTRODE REM PT RTRN 9FT ADLT (ELECTROSURGICAL) ×1 IMPLANT
GAUZE XEROFORM 1X8 LF (GAUZE/BANDAGES/DRESSINGS) ×1 IMPLANT
GLOVE BIOGEL PI IND STRL 8 (GLOVE) ×1 IMPLANT
GLOVE SURG SYN 7.5 E (GLOVE) ×2 IMPLANT
GLOVE SURG SYN 7.5 PF PI (GLOVE) ×2 IMPLANT
GOWN STRL REUS W/ TWL LRG LVL3 (GOWN DISPOSABLE) ×1 IMPLANT
GOWN STRL REUS W/ TWL XL LVL3 (GOWN DISPOSABLE) ×1 IMPLANT
GUIDEWIRE THRD ASNIS 3.2X300 (WIRE) IMPLANT
KIT TURNOVER CYSTO (KITS) ×1 IMPLANT
MANIFOLD NEPTUNE II (INSTRUMENTS) ×1 IMPLANT
MAT ABSORB FLUID 56X50 GRAY (MISCELLANEOUS) ×2 IMPLANT
NDL FILTER BLUNT 18X1 1/2 (NEEDLE) ×1 IMPLANT
NDL HYPO 22X1.5 SAFETY MO (MISCELLANEOUS) ×1 IMPLANT
NEEDLE FILTER BLUNT 18X1 1/2 (NEEDLE) ×1 IMPLANT
NEEDLE HYPO 22X1.5 SAFETY MO (MISCELLANEOUS) ×1 IMPLANT
NS IRRIG 500ML POUR BTL (IV SOLUTION) ×1 IMPLANT
PACK HIP COMPR (MISCELLANEOUS) ×1 IMPLANT
SCREW ASNIS 85MM (Screw) IMPLANT
SCREW ASNIS 90MM (Screw) IMPLANT
SCREW CANN 6.5X80 STRL (Screw) IMPLANT
STAPLER SKIN PROX 35W (STAPLE) ×1 IMPLANT
SUT VIC AB 0 CT1 36 (SUTURE) ×1 IMPLANT
SUT VIC AB 2-0 CT1 TAPERPNT 27 (SUTURE) ×1 IMPLANT
SYR 30ML LL (SYRINGE) ×1 IMPLANT
SYR 5ML LL (SYRINGE) ×1 IMPLANT
TAPE CLOTH 3X10 WHT NS LF (GAUZE/BANDAGES/DRESSINGS) ×1 IMPLANT
TRAP FLUID SMOKE EVACUATOR (MISCELLANEOUS) ×1 IMPLANT
WASHER ORTH CANN SS SCR (Washer) IMPLANT
WATER STERILE IRR 500ML POUR (IV SOLUTION) ×1 IMPLANT

## 2023-10-01 NOTE — Anesthesia Procedure Notes (Signed)
 Procedure Name: Intubation Date/Time: 10/01/2023 2:58 PM  Performed by: Ellwood Haber, CRNAPre-anesthesia Checklist: Patient identified, Patient being monitored, Timeout performed, Emergency Drugs available and Suction available Patient Re-evaluated:Patient Re-evaluated prior to induction Oxygen Delivery Method: Circle system utilized Preoxygenation: Pre-oxygenation with 100% oxygen Induction Type: IV induction Ventilation: Mask ventilation without difficulty Laryngoscope Size: 3 and McGrath Grade View: Grade I Tube type: Oral Tube size: 7.0 mm Number of attempts: 1 Airway Equipment and Method: Stylet Placement Confirmation: ETT inserted through vocal cords under direct vision, positive ETCO2 and breath sounds checked- equal and bilateral Secured at: 21 cm Tube secured with: Tape Dental Injury: Teeth and Oropharynx as per pre-operative assessment  Comments: Smooth atraumatic intubation, no complications noted.

## 2023-10-01 NOTE — Progress Notes (Signed)
 PROGRESS NOTE    Eileen Walsh   YNW:[id number] DOB: [birth year]  DOA: 09/30/2023 Date of Service: 10/01/23 which is hospital day 1  PCP: [facility] course / significant events:   HPI: Eileen Walsh is a 89 y.o. female with medical history significant of HTN, HLD, CAD, dCHf, GERD, Depression, fibromyalgia, SDH, neuropathy, recent right 5th toe fracture, who [facility]sto ED via [facility] from [facility] with fall and right hip and right knee pain. Also already has R 5th toe fracture, foot was in boot. Pt states that she lost her balance and fell when she was trying to sit in chair. She injured her right hip and right knee   04/17: X-ray of right knee is negative for acute fracture. X-ray of right hip showed showed possible fracture which is confirmed by CT scan. CT of head showed small subdural hematoma which is stable on repeat CT scan of head 6 hours later. Pt admitted for fall w/ hip fx, ortho to pin in AM.  04/18: pending repair hip fx      Consultants:  Orthopedic surgery   Procedures/Surgeries: 10/01/23 pin R hip fx pending       ASSESSMENT & PLAN:   Closed right hip fracture  As evidenced by CT scan, pt has right subcapital femoral neck fracture.  Dr. Lydia Sams is planning for OR today Pain control  PT/OT when able to (not ordered now)   Leukocytosis: WBC  12.0.  Likely due to stress-induced demargination.  Patient does not have signs of infection. Follow-up CBC   Fall at home, initial encounter PT/OT when able to   SDH (subdural hematoma)  pt has small SDH which is stable on repeated CT scan. Dr Felipe Horton, neurosurgery, has reviewed. Pt can follow outpatient IV hydralazine  as needed for SBP> 160   Essential hypertension IV hydralazine  as needed for SBP> 160 continue home amlodipine , irbesartan    Chronic diastolic CHF (congestive heart failure)  2D echo on 12/08/2021 showed EF of 60 to 65% with grade 1 diastolic dysfunction. CHF is  compensated. monitor   CAD (coronary artery disease):  no CP. Pt is not taking ASA or statin Monitor for chest pain    Chronic kidney disease, stage 3a  follow BMP   History of AF (paroxysmal atrial fibrillation)  Heart rate 80. Tele monitoring   Right 5th toe fracture: pain control as above   Perioperative Cardiac Risk:  GUPTA score perioperative myocardial infarction or cardaic arrest is 2.84%.  Optimized for surgery     No concerns based on BMI: Body mass index is 24.76 kg/m.  Underweight - under 18  overweight - 25 to 29 obese - 30 or more Class 1 obesity: BMI of 30.0 to 34 Class 2 obesity: BMI of 35.0 to 39 Class 3 obesity: BMI of 40.0 to 49 Super Morbid Obesity: BMI 50-59 Super-super Morbid Obesity: BMI 60+ Significantly low or high BMI is associated with higher medical risk.  Weight management advised as adjunct to other disease management and risk reduction treatments    DVT prophylaxis: lovenox  IV fluids: no continuous IV fluids  Nutrition: NPO pending surgery Central lines / other devices: none  Code Status: FULL CODE ACP documentation reviewed:  none on file in [facility]YNCA  TOC needs: TBD expect will need rehab Medical barriers to dispo: surgery today. Expected medical readiness for discharge tomorrow.              Subjective / Brief ROS:  Patient reports  no concerns Denies CP/SOB.  Pain controlled.  Denies new weakness.    Family Communication: daughter at bedside on rounds     Objective Findings:  Vitals:   10/01/23 0917 10/01/23 1220 10/01/23 1315 10/01/23 1357  BP: 135/60 (!) 154/61  (!) 145/61  Pulse: 75 83  87  Resp: 17 16  14   Temp: 97.7 F (36.5 C)  98.7 F (37.1 C) 98 F (36.7 C)  TempSrc: Oral  Oral Temporal  SpO2: 93% 96%  95%  Weight:      Height:        Intake/Output Summary (Last 24 hours) at 10/01/2023 1520 Last data filed at 10/01/2023 1450 Gross per 24 hour  Intake 100 ml  Output --  Net 100 ml   Filed  Weights   09/30/23 1257  Weight: 71.7 kg    Examination:  Physical Exam Constitutional:      General: She is not in acute distress. Cardiovascular:     Rate and Rhythm: Normal rate and regular rhythm.  Pulmonary:     Effort: Pulmonary effort is normal.     Breath sounds: Normal breath sounds.  Neurological:     Mental Status: She is alert. Mental status is at baseline. She is disoriented.     Comments: Per daughter, she is at her normal level of confusion or slightly worse which she attributes to pain medications   Psychiatric:        Mood and Affect: Mood normal.        Behavior: Behavior normal.          Scheduled Medications:   [MAR Hold] amLODipine   10 mg Oral Daily   And   [MAR Hold] irbesartan   300 mg Oral Daily   [MAR Hold] clobetasol  cream  1 Application Topical BID   [MAR Hold] DULoxetine   30 mg Oral Daily   [MAR Hold] feeding supplement  237 mL Oral BID BM   [MAR Hold] lidocaine   1 patch Transdermal Q24H   [MAR Hold] multivitamin with minerals  1 tablet Oral Daily    Continuous Infusions:    PRN Medications:  [MAR Hold] acetaminophen , [MAR Hold] calcium  carbonate, [MAR Hold] hydrALAZINE , [MAR Hold] methocarbamol , [MAR Hold]  morphine  injection, [MAR Hold] ondansetron  (ZOFRAN ) IV, [MAR Hold] oxyCODONE -acetaminophen , [MAR Hold] senna-docusate  Antimicrobials from admission:  Anti-infectives (From admission, onward)    Start     Dose/Rate Route Frequency Ordered Stop   10/01/23 1400  [MAR Hold]  ceFAZolin  (ANCEF ) IVPB 2g/100 mL premix        (MAR Hold since Fri 10/01/2023 at 1345.Hold Reason: Transfer to a Procedural area)   2 g 200 mL/hr over 30 Minutes Intravenous  Once 09/30/23 2107 10/01/23 1520           Data Reviewed:  I have personally reviewed the following...  CBC: Recent Labs  Lab 09/30/23 2318 10/01/23 0520  WBC 12.0* 10.2  HGB 13.1 13.5  HCT 37.7 38.2  MCV 91.1 91.0  PLT 250 242   Basic Metabolic Panel: Recent Labs  Lab  09/30/23 2318 10/01/23 0520  NA 133* 131*  K 4.1 4.0  CL 103 101  CO2 23 23  GLUCOSE 108* 102*  BUN 16 14  CREATININE 0.78 0.82  CALCIUM  8.8* 8.6*   GFR: Estimated Creatinine Clearance: 42.6 mL/min (by C-G formula based on SCr of 0.82 mg/dL). Liver Function Tests: No results for input(s): "AST", "ALT", "ALKPHOS", "BILITOT", "PROT", "ALBUMIN" in the last 168 hours. No results for input(s): "LIPASE", "  AMYLASE" in the last 168 hours. No results for input(s): "AMMONIA" in the last 168 hours. Coagulation Profile: Recent Labs  Lab 09/30/23 2318  INR 1.1   Cardiac Enzymes: No results for input(s): "CKTOTAL", "CKMB", "CKMBINDEX", "TROPONINI" in the last 168 hours. BNP (last 3 results) No results for input(s): "PROBNP" in the last 8760 hours. HbA1C: No results for input(s): "HGBA1C" in the last 72 hours. CBG: No results for input(s): "GLUCAP" in the last 168 hours. Lipid Profile: No results for input(s): "CHOL", "HDL", "LDLCALC", "TRIG", "CHOLHDL", "LDLDIRECT" in the last 72 hours. Thyroid  Function Tests: No results for input(s): "TSH", "T4TOTAL", "FREET4", "T3FREE", "THYROIDAB" in the last 72 hours. Anemia Panel: No results for input(s): "VITAMINB12", "FOLATE", "FERRITIN", "TIBC", "IRON", "RETICCTPCT" in the last 72 hours. Most Recent Urinalysis On File:     Component Value Date/Time   COLORURINE YELLOW (A) 07/16/2023 1950   APPEARANCEUR CLOUDY (A) 07/16/2023 1950   LABSPEC 1.015 07/16/2023 1950   PHURINE 5.0 07/16/2023 1950   GLUCOSEU NEGATIVE 07/16/2023 1950   HGBUR NEGATIVE 07/16/2023 1950   BILIRUBINUR NEGATIVE 07/16/2023 1950   KETONESUR NEGATIVE 07/16/2023 1950   PROTEINUR 30 (A) 07/16/2023 1950   NITRITE NEGATIVE 07/16/2023 1950   LEUKOCYTESUR MODERATE (A) 07/16/2023 1950   Sepsis Labs: @LABRCNTIP (procalcitonin:4,lacticidven:4) Microbiology: No results found for this or any previous visit (from the past 240 hours).    Radiology Studies last 3 days: DG Foot  Complete Right Result Date: 09/30/2023 CLINICAL DATA:  Foot injury. EXAM: RIGHT FOOT COMPLETE - 3+ VIEW COMPARISON:  None Available. FINDINGS: The bones are osteopenic. There is an angulated comminuted fracture of the distal diaphysis and neck of the fifth metatarsal. This is age indeterminate and may be acute. There is apex lateral angulation and 1 after chest with medial displacement of the distal fracture fragment. There is no dislocation. There is a healed distal fourth metatarsal fracture. There are moderate degenerative changes at the first metatarsophalangeal joint with Alex valgus. There is soft tissue swelling of the dorsum of the foot. IMPRESSION: 1. Angulated comminuted fracture of the distal diaphysis and neck of the fifth metatarsal. This is age indeterminate and may be acute. 2. Healed distal fourth metatarsal fracture. 3. Moderate degenerative changes at the first metatarsophalangeal joint with mild hallux valgus. Electronically Signed   By: Tyron Gallon M.D.   On: 09/30/2023 21:53   CT Head Wo Contrast Result Date: 09/30/2023 CLINICAL DATA:  Subdural hematoma. EXAM: CT HEAD WITHOUT CONTRAST TECHNIQUE: Contiguous axial images were obtained from the base of the skull through the vertex without intravenous contrast. RADIATION DOSE REDUCTION: This exam was performed according to the departmental dose-optimization program which includes automated exposure control, adjustment of the mA and/or kV according to patient size and/or use of iterative reconstruction technique. COMPARISON:  CT head from earlier today. FINDINGS: Brain: Unchanged small subdural hemorrhage along the right cerebral convexity without substantial mass effect. No midline shift. Similar patchy white matter hypodensities, compatible with chronic microvascular disease. No evidence of acute large vascular territory infarct, mass lesion, or hydrocephalus. Vascular: Calcific atherosclerosis.  No hyperdense vessel. Skull: No acute  fracture. Sinuses/Orbits: Clear sinuses.  No acute orbital findings. Other: No mastoid effusions. IMPRESSION: Unchanged small subdural hemorrhage along the right cerebral convexity without substantial mass effect. Electronically Signed   By: Stevenson Elbe M.D.   On: 09/30/2023 21:49   CT Hip Right Wo Contrast Result Date: 09/30/2023 CLINICAL DATA:  Fall, right hip pain. EXAM: CT OF THE RIGHT HIP WITHOUT CONTRAST TECHNIQUE: Multidetector  CT imaging of the right hip was performed according to the standard protocol. Multiplanar CT image reconstructions were also generated. RADIATION DOSE REDUCTION: This exam was performed according to the departmental dose-optimization program which includes automated exposure control, adjustment of the mA and/or kV according to patient size and/or use of iterative reconstruction technique. COMPARISON:  Plain films today. FINDINGS: Bones/Joint/Cartilage There is a subcapital right femoral neck fracture with minimal displacement/angulation. No subluxation or dislocation. Ligaments Suboptimally assessed by CT. Muscles and Tendons Negative Soft tissues Negative IMPRESSION: Right subcapital femoral neck fracture. Electronically Signed   By: Janeece Mechanic M.D.   On: 09/30/2023 19:20   CT HEAD WO CONTRAST ( ) Addendum Date: 09/30/2023 ADDENDUM REPORT: 09/30/2023 16:43 ADDENDUM: These results were called by telephone at the time of interpretation on 09/30/2023 at 4:28 pm to provider Dr. Vicenta Graft, Who verbally acknowledged these results. Electronically Signed   By: Denny Flack M.D.   On: 09/30/2023 16:43   Result Date: 09/30/2023 CLINICAL DATA:  Trauma, fall with possible head strike. EXAM: CT HEAD WITHOUT CONTRAST CT CERVICAL SPINE WITHOUT CONTRAST TECHNIQUE: Multidetector CT imaging of the head and cervical spine was performed following the standard protocol without intravenous contrast. Multiplanar CT image reconstructions of the cervical spine were also generated. RADIATION  DOSE REDUCTION: This exam was performed according to the departmental dose-optimization program which includes automated exposure control, adjustment of the mA and/or kV according to patient size and/or use of iterative reconstruction technique. COMPARISON:  07/16/2023. FINDINGS: CT HEAD FINDINGS Brain: Focus of isoattenuating to slightly hyperattenuating fluid overlying the right frontal convexity measuring up to 3.6 mm in maximum thickness. No significant mass effect. No midline shift. No evidence of parenchymal hemorrhage. Nonspecific hypoattenuation in the periventricular and subcortical white matter favored to reflect chronic microvascular ischemic changes. No CT evidence of acute infarct. The basilar cisterns are patent. Ventricles: Prominence of the ventricles suggesting underlying parenchymal volume loss. Vascular: Atherosclerotic calcifications of the carotid siphons and intracranial vertebral arteries. No hyperdense vessel. Skull: No acute or aggressive finding. Orbits: Orbits are symmetric. Sinuses: The visualized paranasal sinuses are clear. Other: Mastoid air cells are clear. Traumatic Brain Injury Risk Stratification Skull Fracture: No - Low/mBIG 1 Subdural Hematoma (SDH): <54mm - mBIG 1 Subarachnoid Hemorrhage [facility]): No Epidural Hematoma (EDH): No - Low/mBIG 1 Cerebral contusion, intra-axial, intraparenchymal Hemorrhage (IPH): No Intraventricular Hemorrhage (IVH): No - Low/mBIG 1 Midline Shift > 1mm or Edema/effacement of sulci/vents: No - Low/mBIG 1 ---------------------------------------------------- CT CERVICAL SPINE FINDINGS Alignment: Cervical lordosis is maintained. Trace anterolisthesis of C3 on C4 and C4 on C5. Additional 2 mm anterolisthesis of C5 on C6. No facet subluxation or dislocation. Skull base and vertebrae: No compression fracture or displaced fracture in the cervical spine. No suspicious osseous lesion. Soft tissues and spinal canal: No prevertebral fluid or swelling. No visible  canal hematoma. Disc levels: Intervertebral disc space narrowing at multiple levels. No high-grade osseous spinal canal stenosis. Facet arthrosis at multiple levels. Foraminal narrowing is most pronounced at C4-5. Upper chest: Negative. Other: None. IMPRESSION: Small focus of subdural hemorrhage over the right frontal convexity measuring up to 3.6 mm in maximum thickness. No significant mass effect. No midline shift. No acute fracture or traumatic malalignment of the cervical spine. Similar chronic and degenerative changes as above. Electronically Signed: By: Denny Flack M.D. On: 09/30/2023 16:24   CT Cervical Spine Wo Contrast Addendum Date: 09/30/2023 ADDENDUM REPORT: 09/30/2023 16:43 ADDENDUM: These results were called by telephone at the time of interpretation on 09/30/2023 at  4:28 pm to provider Dr. Vicenta Graft, Who verbally acknowledged these results. Electronically Signed   By: Denny Flack M.D.   On: 09/30/2023 16:43   Result Date: 09/30/2023 CLINICAL DATA:  Trauma, fall with possible head strike. EXAM: CT HEAD WITHOUT CONTRAST CT CERVICAL SPINE WITHOUT CONTRAST TECHNIQUE: Multidetector CT imaging of the head and cervical spine was performed following the standard protocol without intravenous contrast. Multiplanar CT image reconstructions of the cervical spine were also generated. RADIATION DOSE REDUCTION: This exam was performed according to the departmental dose-optimization program which includes automated exposure control, adjustment of the mA and/or kV according to patient size and/or use of iterative reconstruction technique. COMPARISON:  07/16/2023. FINDINGS: CT HEAD FINDINGS Brain: Focus of isoattenuating to slightly hyperattenuating fluid overlying the right frontal convexity measuring up to 3.6 mm in maximum thickness. No significant mass effect. No midline shift. No evidence of parenchymal hemorrhage. Nonspecific hypoattenuation in the periventricular and subcortical white matter favored to  reflect chronic microvascular ischemic changes. No CT evidence of acute infarct. The basilar cisterns are patent. Ventricles: Prominence of the ventricles suggesting underlying parenchymal volume loss. Vascular: Atherosclerotic calcifications of the carotid siphons and intracranial vertebral arteries. No hyperdense vessel. Skull: No acute or aggressive finding. Orbits: Orbits are symmetric. Sinuses: The visualized paranasal sinuses are clear. Other: Mastoid air cells are clear. Traumatic Brain Injury Risk Stratification Skull Fracture: No - Low/mBIG 1 Subdural Hematoma (SDH): <69mm - mBIG 1 Subarachnoid Hemorrhage [facility]): No Epidural Hematoma (EDH): No - Low/mBIG 1 Cerebral contusion, intra-axial, intraparenchymal Hemorrhage (IPH): No Intraventricular Hemorrhage (IVH): No - Low/mBIG 1 Midline Shift > 1mm or Edema/effacement of sulci/vents: No - Low/mBIG 1 ---------------------------------------------------- CT CERVICAL SPINE FINDINGS Alignment: Cervical lordosis is maintained. Trace anterolisthesis of C3 on C4 and C4 on C5. Additional 2 mm anterolisthesis of C5 on C6. No facet subluxation or dislocation. Skull base and vertebrae: No compression fracture or displaced fracture in the cervical spine. No suspicious osseous lesion. Soft tissues and spinal canal: No prevertebral fluid or swelling. No visible canal hematoma. Disc levels: Intervertebral disc space narrowing at multiple levels. No high-grade osseous spinal canal stenosis. Facet arthrosis at multiple levels. Foraminal narrowing is most pronounced at C4-5. Upper chest: Negative. Other: None. IMPRESSION: Small focus of subdural hemorrhage over the right frontal convexity measuring up to 3.6 mm in maximum thickness. No significant mass effect. No midline shift. No acute fracture or traumatic malalignment of the cervical spine. Similar chronic and degenerative changes as above. Electronically Signed: By: Denny Flack M.D. On: 09/30/2023 16:24   DG Hip Unilat W  or Wo Pelvis 2-3 Views Right Result Date: 09/30/2023 CLINICAL DATA:  Pain after fall EXAM: DG HIP (WITH OR WITHOUT PELVIS) 2-3V RIGHT COMPARISON:  None Available. FINDINGS: Osteopenia. Preserved joint spaces. Mild hypertrophic changes along the pubic symphysis. There is subtle irregularity along the cortex and along the trabecula of the subcapital right femoral neck. There is also cortical irregularity with step-off laterally about the femoral head/neck. Worrisome for nondisplaced fracture. Elsewhere there presumed vascular calcifications in the pelvis. IMPRESSION: Severe osteopenia. Irregularity along the subcapital right femoral neck with a step-off laterally on the frontal view of the hip, worrisome for nondisplaced fracture. Electronically Signed   By: Adrianna Horde M.D.   On: 09/30/2023 16:20   DG Knee Complete 4 Views Right Result Date: 09/30/2023 CLINICAL DATA:  Pain after fall EXAM: RIGHT KNEE - COMPLETE 2 VIEW COMPARISON:  None Available. FINDINGS: Osteopenia. No fracture or dislocation. Preserved joint spaces. Minimal osteophytes of  the medial compartment. No joint effusion on lateral view. Vascular calcifications seen posterior to the knee. IMPRESSION: Osteopenia.  Slight degenerative changes. Electronically Signed   By: Adrianna Horde M.D.   On: 09/30/2023 16:09         Erbie Arment, DO [facility] 10/01/2023, 3:20 PM    Dictation software may have been used to generate the above note. Typos may occur and escape review in typed/dictated notes. Please contact Dr Authur Leghorn directly for clarity if needed.  Staff may message me via secure chat in Epic  but this may not receive an immediate response,  please page me for urgent matters!  If 7PM-7AM, please contact night coverage [email or web address]

## 2023-10-01 NOTE — Hospital Course (Addendum)
 Hospital course / significant events:   HPI: Eileen Walsh is a 89 y.o. female with medical history significant of HTN, HLD, CAD, dCHf, GERD, Depression, fibromyalgia, SDH, neuropathy, recent right 5th toe fracture, who [facility] with fall and right hip and right knee pain. Also already has R 5th toe fracture, foot was in boot. Pt states that she lost her balance and fell when she was trying to sit in chair. She injured her right hip and right knee   04/17: X-ray of right knee is negative for acute fracture. X-ray of right hip showed showed possible fracture which is confirmed by CT scan. CT of head showed small subdural hematoma which is stable on repeat CT scan of head 6 hours later. Pt admitted for fall w/ hip fx, ortho to pin in AM.  04/18: repair hip fx  04/19: PT/OT recs for SNF rehab     Consultants:  Orthopedic surgery  Neurosurgery   Procedures/Surgeries: 10/01/23 Percutaneous pinning of Right femoral neck fracture - Dr Basilio Both       ASSESSMENT & PLAN:   Closed right hip fracture  As evidenced by CT scan, pt has right subcapital femoral neck fracture.  Dr. Lydia Sams is planning for OR today Pain control  PT/OT when able to (not ordered now)   Leukocytosis: WBC  12.0.  Likely due to stress-induced demargination.  Patient does not have signs of infection. Follow-up CBC   Fall at residence, initial encounter History multiple falls  PT/OT when able, expect will need readmission to SNF/rehab or LTC   SDH (subdural hematoma)  pt has small SDH which is stable on repeated CT scan. Dr Felipe Horton, neurosurgery, has reviewed. Pt can follow outpatient clinic in 2 to 4 weeks with a repeat head CT to evaluate for any expansion of her subdural hematoma given her age would avoid prophylactic antiepileptic's as these can sometimes exacerbate or cause worsening in neurologic symptoms.  IV hydralazine  as needed for SBP> 160   Essential hypertension IV  hydralazine  as needed for SBP> 160 continue home amlodipine , irbesartan    Chronic diastolic CHF (congestive heart failure)  2D echo on 12/08/2021 showed EF of 60 to 65% with grade 1 diastolic dysfunction. CHF is compensated. monitor   CAD (coronary artery disease):  no CP. Pt is not taking ASA or statin Monitor for chest pain    Chronic kidney disease, stage 3a  follow BMP   History of AF (paroxysmal atrial fibrillation)  Heart rate 80. Tele monitoring   Right 5th toe fracture: pain control as above   Dementia Stable, pleasantly confused Precautions per protocol  Perioperative Cardiac Risk:  GUPTA score perioperative myocardial infarction or cardaic arrest is 2.84%.  Optimized for surgery     No concerns based on BMI: Body mass index is 24.76 kg/m.  Underweight - under 18  overweight - 25 to 29 obese - 30 or more Class 1 obesity: BMI of 30.0 to 34 Class 2 obesity: BMI of 35.0 to 39 Class 3 obesity: BMI of 40.0 to 49 Super Morbid Obesity: BMI 50-59 Super-super Morbid Obesity: BMI 60+ Significantly low or high BMI is associated with higher medical risk.  Weight management advised as adjunct to other disease management and risk reduction treatments    DVT prophylaxis: lovenox  IV fluids: no continuous IV fluids  Nutrition: NPO pending surgery Central lines / other devices: none  Code Status: FULL CODE ACP documentation reviewed:  none on file in VYNCA  TOC  needs: SNF rehab  Medical barriers to dispo: none, await placement

## 2023-10-01 NOTE — Progress Notes (Signed)
 PHARMACIST - PHYSICIAN ORDER COMMUNICATION  CONCERNING: P&T Medication Policy on Herbal Medications  DESCRIPTION:  This patient's order(s) for: Lutein  6 mg caps has been noted.  This product(s) is classified as an "herbal" or natural product. Due to a lack of definitive safety studies or FDA approval, nonstandard manufacturing practices, plus the potential risk of unknown drug-drug interactions while on inpatient medications, the Pharmacy and Therapeutics Committee does not permit the use of "herbal" or natural products of this type within [facility].   ACTION TAKEN: The pharmacy department is unable to verify this order at this time.  Please reevaluate patient's clinical condition at discharge and address if the herbal or natural product(s) should be resumed at that time.  Rankin CANDIE Dills, PharmD, MBA 10/01/2023 12:01 AM

## 2023-10-01 NOTE — Consult Note (Signed)
 Consulting Department:  Inpatient medicine  Primary Physician:  [facility]  Chief Complaint: Subdural hematoma  History of Present Illness: 10/01/2023 Eileen Walsh is a 89 y.o. female who presents with the chief complaint of subdural hematoma.  She presented with a polytrauma.  She lost her balance and fell while trying to sit in a chair.  She felt like she hurt her hip knee mostly on the right side.  She does not have any headache or neck pain.  No new numbness or tingling.  No new seizures or other neurologic symptoms.  Going to the OR today for femur fracture.  Review of Systems:  A 10 point review of systems is negative, except for the pertinent positives and negatives detailed in the HPI.  Past Medical History: Past Medical History:  Diagnosis Date   Arthritis    COPD (chronic obstructive pulmonary disease) (HCC)    Fibromyalgia    Hypertension    Macular degeneration     Past Surgical History: Past Surgical History:  Procedure Laterality Date   ABDOMINAL HYSTERECTOMY  1970   BREAST SURGERY  1985   Breast Bx   CHOLECYSTECTOMY      Allergies: Allergies as of 09/30/2023 - Review Complete 09/30/2023  Allergen Reaction Noted   Aspirin  Other (See Comments) 11/05/2014   Sulfa antibiotics Hives and Other (See Comments) 11/05/2014    Medications:  Current Facility-Administered Medications:    acetaminophen  (TYLENOL ) tablet 650 mg, 650 mg, Oral, Q6H PRN, Niu, Xilin, MD   amLODipine  (NORVASC ) tablet 10 mg, 10 mg, Oral, Daily **AND** irbesartan  (AVAPRO ) tablet 300 mg, 300 mg, Oral, Daily, Belue, Nathan S, RPH   calcium  carbonate (TUMS - dosed in mg elemental calcium ) chewable tablet 400 mg of elemental calcium , 2 tablet, Oral, Q4H PRN, Niu, Xilin, MD   ceFAZolin  (ANCEF ) IVPB 2g/100 mL premix, 2 g, Intravenous, Once, Tobie Priest, MD   clobetasol  cream (TEMOVATE ) 0.05 % 1 Application, 1 Application, Topical, BID, Niu, Xilin, MD   DULoxetine  (CYMBALTA ) DR  capsule 30 mg, 30 mg, Oral, Daily, Niu, Xilin, MD   [START ON 10/02/2023] feeding supplement (ENSURE ENLIVE / ENSURE PLUS) liquid 237 mL, 237 mL, Oral, BID BM, Alexander, Natalie, DO   hydrALAZINE  (APRESOLINE ) injection 5 mg, 5 mg, Intravenous, Q2H PRN, Niu, Xilin, MD   lidocaine  (LIDODERM ) 5 % 1 patch, 1 patch, Transdermal, Q24H, Niu, Xilin, MD, 1 patch at 09/30/23 2300   methocarbamol  (ROBAXIN ) tablet 500 mg, 500 mg, Oral, Q8H PRN, Niu, Xilin, MD, 500 mg at 09/30/23 2257   morphine  (PF) 2 MG/ML injection 1 mg, 1 mg, Intravenous, Q4H PRN, Niu, Xilin, MD, 1 mg at 10/01/23 0225   [START ON 10/02/2023] multivitamin with minerals tablet 1 tablet, 1 tablet, Oral, Daily, Alexander, Natalie, DO   ondansetron  (ZOFRAN ) injection 4 mg, 4 mg, Intravenous, Q8H PRN, Niu, Xilin, MD   oxyCODONE -acetaminophen  (PERCOCET/ROXICET) 5-325 MG per tablet 1 tablet, 1 tablet, Oral, Q4H PRN, Niu, Xilin, MD, 1 tablet at 10/01/23 9476   senna-docusate (Senokot-S) tablet 1 tablet, 1 tablet, Oral, QHS PRN, Niu, Xilin, MD  Current Outpatient Medications:    acetaminophen  (TYLENOL ) 500 MG tablet, Take 1,000 mg by mouth every 8 (eight) hours as needed for mild pain (pain score 1-3) or moderate pain (pain score 4-6)., Disp: , Rfl:    amLODipine -olmesartan  (AZOR ) 10-40 MG tablet, Take 1 tablet by mouth daily., Disp: , Rfl:    calcium  carbonate (TUMS - DOSED IN MG ELEMENTAL CALCIUM ) 500 MG chewable tablet, Chew 2  tablets by mouth every 4 (four) hours as needed for indigestion or heartburn., Disp: , Rfl:    clobetasol  cream (TEMOVATE ) 0.05 %, Apply 1 Application topically 2 (two) times daily., Disp: , Rfl:    DULoxetine  (CYMBALTA ) 30 MG capsule, Take 1 capsule (30 mg total) by mouth daily., Disp: 90 capsule, Rfl: 1   LUTEIN  PO, Take 1 tablet by mouth daily., Disp: , Rfl:    Social History: Social History   Tobacco Use   Smoking status: Never   Smokeless tobacco: Never  Vaping Use   Vaping status: Never Used  Substance Use  Topics   Alcohol  use: Not Currently    Alcohol /week: 0.0 standard drinks of alcohol    Drug use: No    Family Medical History: Family History  Problem Relation Age of Onset   Hypertension Mother    Arthritis Mother    Hyperlipidemia Mother    Heart disease Father    Hypertension Brother    Stroke Brother    Diabetes Brother    Arthritis Brother    Heart disease Brother    Anxiety disorder Brother        severe. had nervous breakdown    Physical Examination: Vitals:   10/01/23 0830 10/01/23 0917  BP: (!) 149/59 135/60  Pulse: 76 75  Resp: 18 17  Temp:  97.7 F (36.5 C)  SpO2: 94% 93%     General: Patient is well developed, well nourished, calm, collected, and in no apparent distress.  NEUROLOGICAL:  General: In no acute distress.   Awake, alert, oriented to person, place  Pupils equal round and reactive to light.  Full Facial tone is symmetric.  Tongue protrusion is midline.   There is no pronator drift.  Language is conversant.  GCS:15  Imaging: DG Foot Complete Right Result Date: 09/30/2023 CLINICAL DATA:  Foot injury. EXAM: RIGHT FOOT COMPLETE - 3+ VIEW COMPARISON:  None Available. FINDINGS: The bones are osteopenic. There is an angulated comminuted fracture of the distal diaphysis and neck of the fifth metatarsal. This is age indeterminate and may be acute. There is apex lateral angulation and 1 after chest with medial displacement of the distal fracture fragment. There is no dislocation. There is a healed distal fourth metatarsal fracture. There are moderate degenerative changes at the first metatarsophalangeal joint with Alex valgus. There is soft tissue swelling of the dorsum of the foot. IMPRESSION: 1. Angulated comminuted fracture of the distal diaphysis and neck of the fifth metatarsal. This is age indeterminate and may be acute. 2. Healed distal fourth metatarsal fracture. 3. Moderate degenerative changes at the first metatarsophalangeal joint with mild hallux  valgus. Electronically Signed   By: Greig Pique M.D.   On: 09/30/2023 21:53   CT Head Wo Contrast Result Date: 09/30/2023 CLINICAL DATA:  Subdural hematoma. EXAM: CT HEAD WITHOUT CONTRAST TECHNIQUE: Contiguous axial images were obtained from the base of the skull through the vertex without intravenous contrast. RADIATION DOSE REDUCTION: This exam was performed according to the departmental dose-optimization program which includes automated exposure control, adjustment of the mA and/or kV according to patient size and/or use of iterative reconstruction technique. COMPARISON:  CT head from earlier today. FINDINGS: Brain: Unchanged small subdural hemorrhage along the right cerebral convexity without substantial mass effect. No midline shift. Similar patchy white matter hypodensities, compatible with chronic microvascular disease. No evidence of acute large vascular territory infarct, mass lesion, or hydrocephalus. Vascular: Calcific atherosclerosis.  No hyperdense vessel. Skull: No acute fracture. Sinuses/Orbits: Clear sinuses.  No acute orbital findings. Other: No mastoid effusions. IMPRESSION: Unchanged small subdural hemorrhage along the right cerebral convexity without substantial mass effect. Electronically Signed   By: Gilmore GORMAN Molt M.D.   On: 09/30/2023 21:49   CT Hip Right Wo Contrast Result Date: 09/30/2023 CLINICAL DATA:  Fall, right hip pain. EXAM: CT OF THE RIGHT HIP WITHOUT CONTRAST TECHNIQUE: Multidetector CT imaging of the right hip was performed according to the standard protocol. Multiplanar CT image reconstructions were also generated. RADIATION DOSE REDUCTION: This exam was performed according to the departmental dose-optimization program which includes automated exposure control, adjustment of the mA and/or kV according to patient size and/or use of iterative reconstruction technique. COMPARISON:  Plain films today. FINDINGS: Bones/Joint/Cartilage There is a subcapital right femoral neck  fracture with minimal displacement/angulation. No subluxation or dislocation. Ligaments Suboptimally assessed by CT. Muscles and Tendons Negative Soft tissues Negative IMPRESSION: Right subcapital femoral neck fracture. Electronically Signed   By: Franky Crease M.D.   On: 09/30/2023 19:20   CT HEAD WO CONTRAST ( ) Addendum Date: 09/30/2023 ADDENDUM REPORT: 09/30/2023 16:43 ADDENDUM: These results were called by telephone at the time of interpretation on 09/30/2023 at 4:28 pm to provider Dr. Viviann, Who verbally acknowledged these results. Electronically Signed   By: Donnice Mania M.D.   On: 09/30/2023 16:43   Result Date: 09/30/2023 CLINICAL DATA:  Trauma, fall with possible head strike. EXAM: CT HEAD WITHOUT CONTRAST CT CERVICAL SPINE WITHOUT CONTRAST TECHNIQUE: Multidetector CT imaging of the head and cervical spine was performed following the standard protocol without intravenous contrast. Multiplanar CT image reconstructions of the cervical spine were also generated. RADIATION DOSE REDUCTION: This exam was performed according to the departmental dose-optimization program which includes automated exposure control, adjustment of the mA and/or kV according to patient size and/or use of iterative reconstruction technique. COMPARISON:  07/16/2023. FINDINGS: CT HEAD FINDINGS Brain: Focus of isoattenuating to slightly hyperattenuating fluid overlying the right frontal convexity measuring up to 3.6 mm in maximum thickness. No significant mass effect. No midline shift. No evidence of parenchymal hemorrhage. Nonspecific hypoattenuation in the periventricular and subcortical white matter favored to reflect chronic microvascular ischemic changes. No CT evidence of acute infarct. The basilar cisterns are patent. Ventricles: Prominence of the ventricles suggesting underlying parenchymal volume loss. Vascular: Atherosclerotic calcifications of the carotid siphons and intracranial vertebral arteries. No hyperdense  vessel. Skull: No acute or aggressive finding. Orbits: Orbits are symmetric. Sinuses: The visualized paranasal sinuses are clear. Other: Mastoid air cells are clear. Traumatic Brain Injury Risk Stratification Skull Fracture: No - Low/mBIG 1 Subdural Hematoma (SDH): <44mm - mBIG 1 Subarachnoid Hemorrhage [facility]): No Epidural Hematoma (EDH): No - Low/mBIG 1 Cerebral contusion, intra-axial, intraparenchymal Hemorrhage (IPH): No Intraventricular Hemorrhage (IVH): No - Low/mBIG 1 Midline Shift > 1mm or Edema/effacement of sulci/vents: No - Low/mBIG 1 ---------------------------------------------------- CT CERVICAL SPINE FINDINGS Alignment: Cervical lordosis is maintained. Trace anterolisthesis of C3 on C4 and C4 on C5. Additional 2 mm anterolisthesis of C5 on C6. No facet subluxation or dislocation. Skull base and vertebrae: No compression fracture or displaced fracture in the cervical spine. No suspicious osseous lesion. Soft tissues and spinal canal: No prevertebral fluid or swelling. No visible canal hematoma. Disc levels: Intervertebral disc space narrowing at multiple levels. No high-grade osseous spinal canal stenosis. Facet arthrosis at multiple levels. Foraminal narrowing is most pronounced at C4-5. Upper chest: Negative. Other: None. IMPRESSION: Small focus of subdural hemorrhage over the right frontal convexity measuring up to 3.6 mm in maximum thickness.  No significant mass effect. No midline shift. No acute fracture or traumatic malalignment of the cervical spine. Similar chronic and degenerative changes as above. Electronically Signed: By: Donnice Mania M.D. On: 09/30/2023 16:24   CT Cervical Spine Wo Contrast Addendum Date: 09/30/2023 ADDENDUM REPORT: 09/30/2023 16:43 ADDENDUM: These results were called by telephone at the time of interpretation on 09/30/2023 at 4:28 pm to provider Dr. Viviann, Who verbally acknowledged these results. Electronically Signed   By: Donnice Mania M.D.   On: 09/30/2023 16:43    Result Date: 09/30/2023 CLINICAL DATA:  Trauma, fall with possible head strike. EXAM: CT HEAD WITHOUT CONTRAST CT CERVICAL SPINE WITHOUT CONTRAST TECHNIQUE: Multidetector CT imaging of the head and cervical spine was performed following the standard protocol without intravenous contrast. Multiplanar CT image reconstructions of the cervical spine were also generated. RADIATION DOSE REDUCTION: This exam was performed according to the departmental dose-optimization program which includes automated exposure control, adjustment of the mA and/or kV according to patient size and/or use of iterative reconstruction technique. COMPARISON:  07/16/2023. FINDINGS: CT HEAD FINDINGS Brain: Focus of isoattenuating to slightly hyperattenuating fluid overlying the right frontal convexity measuring up to 3.6 mm in maximum thickness. No significant mass effect. No midline shift. No evidence of parenchymal hemorrhage. Nonspecific hypoattenuation in the periventricular and subcortical white matter favored to reflect chronic microvascular ischemic changes. No CT evidence of acute infarct. The basilar cisterns are patent. Ventricles: Prominence of the ventricles suggesting underlying parenchymal volume loss. Vascular: Atherosclerotic calcifications of the carotid siphons and intracranial vertebral arteries. No hyperdense vessel. Skull: No acute or aggressive finding. Orbits: Orbits are symmetric. Sinuses: The visualized paranasal sinuses are clear. Other: Mastoid air cells are clear. Traumatic Brain Injury Risk Stratification Skull Fracture: No - Low/mBIG 1 Subdural Hematoma (SDH): <67mm - mBIG 1 Subarachnoid Hemorrhage [facility]): No Epidural Hematoma (EDH): No - Low/mBIG 1 Cerebral contusion, intra-axial, intraparenchymal Hemorrhage (IPH): No Intraventricular Hemorrhage (IVH): No - Low/mBIG 1 Midline Shift > 1mm or Edema/effacement of sulci/vents: No - Low/mBIG 1 ---------------------------------------------------- CT CERVICAL SPINE  FINDINGS Alignment: Cervical lordosis is maintained. Trace anterolisthesis of C3 on C4 and C4 on C5. Additional 2 mm anterolisthesis of C5 on C6. No facet subluxation or dislocation. Skull base and vertebrae: No compression fracture or displaced fracture in the cervical spine. No suspicious osseous lesion. Soft tissues and spinal canal: No prevertebral fluid or swelling. No visible canal hematoma. Disc levels: Intervertebral disc space narrowing at multiple levels. No high-grade osseous spinal canal stenosis. Facet arthrosis at multiple levels. Foraminal narrowing is most pronounced at C4-5. Upper chest: Negative. Other: None. IMPRESSION: Small focus of subdural hemorrhage over the right frontal convexity measuring up to 3.6 mm in maximum thickness. No significant mass effect. No midline shift. No acute fracture or traumatic malalignment of the cervical spine. Similar chronic and degenerative changes as above. Electronically Signed: By: Donnice Mania M.D. On: 09/30/2023 16:24   DG Hip Unilat W or Wo Pelvis 2-3 Views Right Result Date: 09/30/2023 CLINICAL DATA:  Pain after fall EXAM: DG HIP (WITH OR WITHOUT PELVIS) 2-3V RIGHT COMPARISON:  None Available. FINDINGS: Osteopenia. Preserved joint spaces. Mild hypertrophic changes along the pubic symphysis. There is subtle irregularity along the cortex and along the trabecula of the subcapital right femoral neck. There is also cortical irregularity with step-off laterally about the femoral head/neck. Worrisome for nondisplaced fracture. Elsewhere there presumed vascular calcifications in the pelvis. IMPRESSION: Severe osteopenia. Irregularity along the subcapital right femoral neck with a step-off laterally on the  frontal view of the hip, worrisome for nondisplaced fracture. Electronically Signed   By: Ranell Bring M.D.   On: 09/30/2023 16:20   DG Knee Complete 4 Views Right Result Date: 09/30/2023 CLINICAL DATA:  Pain after fall EXAM: RIGHT KNEE - COMPLETE 2 VIEW  COMPARISON:  None Available. FINDINGS: Osteopenia. No fracture or dislocation. Preserved joint spaces. Minimal osteophytes of the medial compartment. No joint effusion on lateral view. Vascular calcifications seen posterior to the knee. IMPRESSION: Osteopenia.  Slight degenerative changes. Electronically Signed   By: Ranell Bring M.D.   On: 09/30/2023 16:09   Review of her CT scan compared to her prior CT scans demonstrate that this is likely a subacute to chronic subdural hematoma.  Eileen Walsh's been stable over the interim scans.  Does not need a another interim scan unless needed for motor or sensorium changes.  I have personally reviewed the images and agree with the above interpretation.  Labs:    Latest Ref Rng & Units 10/01/2023    5:20 AM 09/30/2023   11:18 PM 07/16/2023    7:50 PM  CBC  WBC 4.0 - 10.5 K/uL 10.2  12.0  11.3   Hemoglobin 12.0 - 15.0 g/dL 86.4  86.8  85.5   Hematocrit 36.0 - 46.0 % 38.2  37.7  43.4   Platelets 150 - 400 K/uL 242  250  256       Latest Ref Rng & Units 10/01/2023    5:20 AM 09/30/2023   11:18 PM 07/16/2023    7:50 PM  BMP  Glucose 70 - 99 mg/dL 897  891  830   BUN 8 - 23 mg/dL 14  16  19    Creatinine 0.44 - 1.00 mg/dL 9.17  9.21  9.01   Sodium 135 - 145 mmol/L 131  133  137   Potassium 3.5 - 5.1 mmol/L 4.0  4.1  3.9   Chloride 98 - 111 mmol/L 101  103  102   CO2 22 - 32 mmol/L 23  23  25    Calcium  8.9 - 10.3 mg/dL 8.6  8.8  9.4     INR  1.1 (04/17 2318)   Assessment and Plan: Ms. Mikels is a pleasant 89 y.o. female with history of hypertension, hyperlipidemia, coronary artery disease congestive heart failure depression myalgia, who presented with a polytrauma yesterday.  She had injuries of her appendicular skeleton and also was found to have a subdural hematoma.  On clinical exam today she is awake conversant without focal deficit barring the limitations of her examination given her orthopedic fractures..  Her imaging demonstrates a subdural  hematoma without any worrisome mass effect or midline shift.  Repeat head CT demonstrated overall stability.  Her CT cervical spine demonstrated no evidence of fractures but just changes that appear to be chronic and arthritic in nature.  At this point she can follow-up in neurosurgery clinic in 2 to 4 weeks with a repeat head CT to evaluate for any expansion of her subdural hematoma.  At this point given her age I would avoid prophylactic antiepileptic's as these can sometimes exacerbate or cause worsening in neurologic symptoms.  Penne MICAEL Sharps, MD/MSCR Dept. of Neurosurgery

## 2023-10-01 NOTE — Anesthesia Postprocedure Evaluation (Signed)
 Anesthesia Post Note  Patient: Eileen Walsh  Procedure(s) Performed: RIGHT HIP PERCUTANEOUS PINNING (Right: Hip)  Patient location during evaluation: PACU Anesthesia Type: General Level of consciousness: awake and alert and patient cooperative Pain management: pain level controlled Vital Signs Assessment: post-procedure vital signs reviewed and stable Respiratory status: spontaneous breathing, nonlabored ventilation and respiratory function stable Cardiovascular status: blood pressure returned to baseline and stable Postop Assessment: adequate PO intake Anesthetic complications: no   No notable events documented.   Last Vitals:  Vitals:   10/01/23 1618 10/01/23 1630  BP: (!) 174/72 (!) 156/68  Pulse: 75 75  Resp: 15 14  Temp:    SpO2: 91% 99%    Last Pain:  Vitals:   10/01/23 1621  TempSrc:   PainSc: 0-No pain                 Alfonso Ruths

## 2023-10-01 NOTE — TOC CM/SW Note (Signed)
 Per chart - patient from [facility]. Surgery scheduled for today. Attempted call to Christiana at [place] to confirm. TOC will follow for needs/DC planning.   Taiyana Kissler, LCSW Transitions of Care Department [phone]

## 2023-10-01 NOTE — Progress Notes (Signed)
 As of this moment, PACU does not have SCD machines.  Requested from portables.  [facility] is out at this moment.  SCD sleeves placed on patient.

## 2023-10-01 NOTE — Consult Note (Signed)
 ORTHOPAEDIC CONSULTATION  REQUESTING PHYSICIAN: Melodi Sprung, DO  Chief Complaint:   R hip pain  History of Present Illness: History obtained from the patient's daughter over the phone, review of medical record, and discussion with medical team.  Eileen Walsh is a 89 y.o. female who had a fall yesterday.  The patient noted immediate hip pain and inability to get back up.  The patient ambulates with a walker at baseline, but only for short distances.  Otherwise, she is in a wheelchair.  Just last week, the patient was transitioned to [facility] facility. X-rays in the emergency department show a right minimally displaced femoral neck fracture.  Of note, she has 5th metatarsal fracture on the right side that has been treated nonoperatively in a boot.  Of note, a CT scan of the head showed a 3 mm subdural hematoma.  Repeat scan showed stable findings.  Of note, she does have a medical history significant for CHF, CAD, CKD, and atrial fibrillation (not on anticoagulation).  Past Medical History:  Diagnosis Date   Arthritis    COPD (chronic obstructive pulmonary disease) (HCC)    Fibromyalgia    Hypertension    Macular degeneration    Past Surgical History:  Procedure Laterality Date   ABDOMINAL HYSTERECTOMY  1970   BREAST SURGERY  1985   Breast Bx   CHOLECYSTECTOMY     Social History   Socioeconomic History   Marital status: Widowed    Spouse name: Not on file   Number of children: 3   Years of education: Not on file   Highest education level: High school graduate  Occupational History   Occupation: retired  Tobacco Use   Smoking status: Never   Smokeless tobacco: Never  Vaping Use   Vaping status: Never Used  Substance and Sexual Activity   Alcohol use: Not Currently    Alcohol/week: 0.0 standard drinks of alcohol   Drug use: No   Sexual activity: Not Currently  Other Topics Concern    Not on file  Social History Narrative   Not on file   Social Drivers of Health   Financial Resource Strain: Low Risk  (08/31/2023)   Received from [facility]   Overall Financial Resource Strain (CARDIA)    Difficulty of Paying Living Expenses: Not hard at all  Food Insecurity: No Food Insecurity (08/31/2023)   Received from [facility]   Hunger Vital Sign    Worried About Running Out of Food in the Last Year: Never true    Ran Out of Food in the Last Year: Never true  Transportation Needs: No Transportation Needs (08/31/2023)   Received from [facility]   PRAPARE - Transportation    Lack of Transportation (Medical): No    Lack of Transportation (Non-Medical): No  Physical Activity: Inactive (10/09/2019)   Exercise Vital Sign    Days of Exercise per Week: 0 days    Minutes of Exercise per Session: 0 min  Stress: No Stress Concern Present (10/09/2019)   [employer] of Occupational Health - Occupational Stress Questionnaire    Feeling of Stress : Only a little  Social Connections: Moderately Isolated (10/09/2019)   Social Connection and Isolation Panel [NHANES]    Frequency of Communication with Friends and Family: More than three times a week    Frequency of Social Gatherings with Friends and Family: Three times a week    Attends Religious Services: More than 4 times per year  Active Member of Clubs or Organizations: No    Attends Banker Meetings: Never    Marital Status: Widowed   Family History  Problem Relation Age of Onset   Hypertension Mother    Arthritis Mother    Hyperlipidemia Mother    Heart disease Father    Hypertension Brother    Stroke Brother    Diabetes Brother    Arthritis Brother    Heart disease Brother    Anxiety disorder Brother        severe. had nervous breakdown   Allergies  Allergen Reactions   Aspirin  Other (See Comments)   Sulfa Antibiotics Hives and Other (See Comments)   Prior to Admission medications    Medication Sig Start Date End Date Taking? Authorizing Provider  acetaminophen  (TYLENOL ) 500 MG tablet Take 1,000 mg by mouth every 8 (eight) hours as needed for mild pain (pain score 1-3) or moderate pain (pain score 4-6).   Yes [provider]  amLODipine -olmesartan  (AZOR ) 10-40 MG tablet Take 1 tablet by mouth daily. 09/23/23  Yes [provider]  calcium  carbonate (TUMS - DOSED IN MG ELEMENTAL CALCIUM ) 500 MG chewable tablet Chew 2 tablets by mouth every 4 (four) hours as needed for indigestion or heartburn.   Yes [provider]  clobetasol  cream (TEMOVATE ) 0.05 % Apply 1 Application topically 2 (two) times daily.   Yes [provider]  DULoxetine  (CYMBALTA ) 30 MG capsule Take 1 capsule (30 mg total) by mouth daily. 02/04/22  Yes Nikki Barters, MD  LUTEIN  PO Take 1 tablet by mouth daily.   Yes [provider]   Recent Labs    09/30/23 2318 10/01/23 0520  WBC 12.0* 10.2  HGB 13.1 13.5  HCT 37.7 38.2  PLT 250 242  K 4.1 4.0  CL 103 101  CO2 23 23  BUN 16 14  CREATININE 0.78 0.82  GLUCOSE 108* 102*  CALCIUM  8.8* 8.6*  INR 1.1  --    DG Foot Complete Right Result Date: 09/30/2023 CLINICAL DATA:  Foot injury. EXAM: RIGHT FOOT COMPLETE - 3+ VIEW COMPARISON:  None Available. FINDINGS: The bones are osteopenic. There is an angulated comminuted fracture of the distal diaphysis and neck of the fifth metatarsal. This is age indeterminate and may be acute. There is apex lateral angulation and 1 after chest with medial displacement of the distal fracture fragment. There is no dislocation. There is a healed distal fourth metatarsal fracture. There are moderate degenerative changes at the first metatarsophalangeal joint with Alex valgus. There is soft tissue swelling of the dorsum of the foot. IMPRESSION: 1. Angulated comminuted fracture of the distal diaphysis and neck of the fifth metatarsal. This is age indeterminate and may be acute. 2. Healed  distal fourth metatarsal fracture. 3. Moderate degenerative changes at the first metatarsophalangeal joint with mild hallux valgus. Electronically Signed   By: Tyron Gallon M.D.   On: 09/30/2023 21:53   CT Head Wo Contrast Result Date: 09/30/2023 CLINICAL DATA:  Subdural hematoma. EXAM: CT HEAD WITHOUT CONTRAST TECHNIQUE: Contiguous axial images were obtained from the base of the skull through the vertex without intravenous contrast. RADIATION DOSE REDUCTION: This exam was performed according to the departmental dose-optimization program which includes automated exposure control, adjustment of the mA and/or kV according to patient size and/or use of iterative reconstruction technique. COMPARISON:  CT head from earlier today. FINDINGS: Brain: Unchanged small subdural hemorrhage along the right cerebral convexity without substantial mass effect. No midline shift.  Similar patchy white matter hypodensities, compatible with chronic microvascular disease. No evidence of acute large vascular territory infarct, mass lesion, or hydrocephalus. Vascular: Calcific atherosclerosis.  No hyperdense vessel. Skull: No acute fracture. Sinuses/Orbits: Clear sinuses.  No acute orbital findings. Other: No mastoid effusions. IMPRESSION: Unchanged small subdural hemorrhage along the right cerebral convexity without substantial mass effect. Electronically Signed   By: Stevenson Elbe M.D.   On: 09/30/2023 21:49   CT Hip Right Wo Contrast Result Date: 09/30/2023 CLINICAL DATA:  Fall, right hip pain. EXAM: CT OF THE RIGHT HIP WITHOUT CONTRAST TECHNIQUE: Multidetector CT imaging of the right hip was performed according to the standard protocol. Multiplanar CT image reconstructions were also generated. RADIATION DOSE REDUCTION: This exam was performed according to the departmental dose-optimization program which includes automated exposure control, adjustment of the mA and/or kV according to patient size and/or use of iterative  reconstruction technique. COMPARISON:  Plain films today. FINDINGS: Bones/Joint/Cartilage There is a subcapital right femoral neck fracture with minimal displacement/angulation. No subluxation or dislocation. Ligaments Suboptimally assessed by CT. Muscles and Tendons Negative Soft tissues Negative IMPRESSION: Right subcapital femoral neck fracture. Electronically Signed   By: Janeece Mechanic M.D.   On: 09/30/2023 19:20   CT HEAD WO CONTRAST ( ) Addendum Date: 09/30/2023 ADDENDUM REPORT: 09/30/2023 16:43 ADDENDUM: These results were called by telephone at the time of interpretation on 09/30/2023 at 4:28 pm to provider Dr. Vicenta Graft, Who verbally acknowledged these results. Electronically Signed   By: Denny Flack M.D.   On: 09/30/2023 16:43   Result Date: 09/30/2023 CLINICAL DATA:  Trauma, fall with possible head strike. EXAM: CT HEAD WITHOUT CONTRAST CT CERVICAL SPINE WITHOUT CONTRAST TECHNIQUE: Multidetector CT imaging of the head and cervical spine was performed following the standard protocol without intravenous contrast. Multiplanar CT image reconstructions of the cervical spine were also generated. RADIATION DOSE REDUCTION: This exam was performed according to the departmental dose-optimization program which includes automated exposure control, adjustment of the mA and/or kV according to patient size and/or use of iterative reconstruction technique. COMPARISON:  07/16/2023. FINDINGS: CT HEAD FINDINGS Brain: Focus of isoattenuating to slightly hyperattenuating fluid overlying the right frontal convexity measuring up to 3.6 mm in maximum thickness. No significant mass effect. No midline shift. No evidence of parenchymal hemorrhage. Nonspecific hypoattenuation in the periventricular and subcortical white matter favored to reflect chronic microvascular ischemic changes. No CT evidence of acute infarct. The basilar cisterns are patent. Ventricles: Prominence of the ventricles suggesting underlying parenchymal  volume loss. Vascular: Atherosclerotic calcifications of the carotid siphons and intracranial vertebral arteries. No hyperdense vessel. Skull: No acute or aggressive finding. Orbits: Orbits are symmetric. Sinuses: The visualized paranasal sinuses are clear. Other: Mastoid air cells are clear. Traumatic Brain Injury Risk Stratification Skull Fracture: No - Low/mBIG 1 Subdural Hematoma (SDH): <41mm - mBIG 1 Subarachnoid Hemorrhage [facility]): No Epidural Hematoma (EDH): No - Low/mBIG 1 Cerebral contusion, intra-axial, intraparenchymal Hemorrhage (IPH): No Intraventricular Hemorrhage (IVH): No - Low/mBIG 1 Midline Shift > 1mm or Edema/effacement of sulci/vents: No - Low/mBIG 1 ---------------------------------------------------- CT CERVICAL SPINE FINDINGS Alignment: Cervical lordosis is maintained. Trace anterolisthesis of C3 on C4 and C4 on C5. Additional 2 mm anterolisthesis of C5 on C6. No facet subluxation or dislocation. Skull base and vertebrae: No compression fracture or displaced fracture in the cervical spine. No suspicious osseous lesion. Soft tissues and spinal canal: No prevertebral fluid or swelling. No visible canal hematoma. Disc levels: Intervertebral disc space narrowing at multiple levels. No high-grade osseous spinal canal  stenosis. Facet arthrosis at multiple levels. Foraminal narrowing is most pronounced at C4-5. Upper chest: Negative. Other: None. IMPRESSION: Small focus of subdural hemorrhage over the right frontal convexity measuring up to 3.6 mm in maximum thickness. No significant mass effect. No midline shift. No acute fracture or traumatic malalignment of the cervical spine. Similar chronic and degenerative changes as above. Electronically Signed: By: Denny Flack M.D. On: 09/30/2023 16:24   CT Cervical Spine Wo Contrast Addendum Date: 09/30/2023 ADDENDUM REPORT: 09/30/2023 16:43 ADDENDUM: These results were called by telephone at the time of interpretation on 09/30/2023 at 4:28 pm to provider  Dr. Vicenta Graft, Who verbally acknowledged these results. Electronically Signed   By: Denny Flack M.D.   On: 09/30/2023 16:43   Result Date: 09/30/2023 CLINICAL DATA:  Trauma, fall with possible head strike. EXAM: CT HEAD WITHOUT CONTRAST CT CERVICAL SPINE WITHOUT CONTRAST TECHNIQUE: Multidetector CT imaging of the head and cervical spine was performed following the standard protocol without intravenous contrast. Multiplanar CT image reconstructions of the cervical spine were also generated. RADIATION DOSE REDUCTION: This exam was performed according to the departmental dose-optimization program which includes automated exposure control, adjustment of the mA and/or kV according to patient size and/or use of iterative reconstruction technique. COMPARISON:  07/16/2023. FINDINGS: CT HEAD FINDINGS Brain: Focus of isoattenuating to slightly hyperattenuating fluid overlying the right frontal convexity measuring up to 3.6 mm in maximum thickness. No significant mass effect. No midline shift. No evidence of parenchymal hemorrhage. Nonspecific hypoattenuation in the periventricular and subcortical white matter favored to reflect chronic microvascular ischemic changes. No CT evidence of acute infarct. The basilar cisterns are patent. Ventricles: Prominence of the ventricles suggesting underlying parenchymal volume loss. Vascular: Atherosclerotic calcifications of the carotid siphons and intracranial vertebral arteries. No hyperdense vessel. Skull: No acute or aggressive finding. Orbits: Orbits are symmetric. Sinuses: The visualized paranasal sinuses are clear. Other: Mastoid air cells are clear. Traumatic Brain Injury Risk Stratification Skull Fracture: No - Low/mBIG 1 Subdural Hematoma (SDH): <1mm - mBIG 1 Subarachnoid Hemorrhage [facility]): No Epidural Hematoma (EDH): No - Low/mBIG 1 Cerebral contusion, intra-axial, intraparenchymal Hemorrhage (IPH): No Intraventricular Hemorrhage (IVH): No - Low/mBIG 1 Midline Shift > 1mm or  Edema/effacement of sulci/vents: No - Low/mBIG 1 ---------------------------------------------------- CT CERVICAL SPINE FINDINGS Alignment: Cervical lordosis is maintained. Trace anterolisthesis of C3 on C4 and C4 on C5. Additional 2 mm anterolisthesis of C5 on C6. No facet subluxation or dislocation. Skull base and vertebrae: No compression fracture or displaced fracture in the cervical spine. No suspicious osseous lesion. Soft tissues and spinal canal: No prevertebral fluid or swelling. No visible canal hematoma. Disc levels: Intervertebral disc space narrowing at multiple levels. No high-grade osseous spinal canal stenosis. Facet arthrosis at multiple levels. Foraminal narrowing is most pronounced at C4-5. Upper chest: Negative. Other: None. IMPRESSION: Small focus of subdural hemorrhage over the right frontal convexity measuring up to 3.6 mm in maximum thickness. No significant mass effect. No midline shift. No acute fracture or traumatic malalignment of the cervical spine. Similar chronic and degenerative changes as above. Electronically Signed: By: Denny Flack M.D. On: 09/30/2023 16:24   DG Hip Unilat W or Wo Pelvis 2-3 Views Right Result Date: 09/30/2023 CLINICAL DATA:  Pain after fall EXAM: DG HIP (WITH OR WITHOUT PELVIS) 2-3V RIGHT COMPARISON:  None Available. FINDINGS: Osteopenia. Preserved joint spaces. Mild hypertrophic changes along the pubic symphysis. There is subtle irregularity along the cortex and along the trabecula of the subcapital right femoral neck. There is also  cortical irregularity with step-off laterally about the femoral head/neck. Worrisome for nondisplaced fracture. Elsewhere there presumed vascular calcifications in the pelvis. IMPRESSION: Severe osteopenia. Irregularity along the subcapital right femoral neck with a step-off laterally on the frontal view of the hip, worrisome for nondisplaced fracture. Electronically Signed   By: Adrianna Horde M.D.   On: 09/30/2023 16:20   DG  Knee Complete 4 Views Right Result Date: 09/30/2023 CLINICAL DATA:  Pain after fall EXAM: RIGHT KNEE - COMPLETE 2 VIEW COMPARISON:  None Available. FINDINGS: Osteopenia. No fracture or dislocation. Preserved joint spaces. Minimal osteophytes of the medial compartment. No joint effusion on lateral view. Vascular calcifications seen posterior to the knee. IMPRESSION: Osteopenia.  Slight degenerative changes. Electronically Signed   By: Adrianna Horde M.D.   On: 09/30/2023 16:09     Positive ROS: All other systems have been reviewed and were otherwise negative with the exception of those mentioned in the HPI and as above.  Physical Exam: BP (!) 155/61   Pulse 80   Temp 98.4 F (36.9 C) (Oral)   Resp 19   Ht 5\' 7"  (1.702 m)   Wt 71.7 kg   SpO2 95%   BMI 24.76 kg/m  General:  Alert, no acute distress Psychiatric:  Patient is competent for consent with normal mood and affect     Orthopedic Exam:  RLE: + DF/PF/EHL SILT grossly over foot Foot wwp +Log roll/axial load Leg shortened and externally rotated   Imaging:  As above: R minimally displaced/valgus impacted femoral neck fracture  Right foot x-ray shows healing fifth metatarsal fracture with appropriate callus formation suggestive of chronic fracture.  Assessment/Plan: SKILYNN DURNEY is a 89 y.o. female with a R minimally displaced/valgus impacted femoral neck fracture   1. I discussed the various treatment options including both surgical and non-surgical management of the fracture with the patient and/or family (medical PoA). We discussed the high risk of perioperative complications due to patient's age, dementia, and other co-morbidities. After discussion of risks, benefits, and alternatives to surgery, the family and/or patient were in agreement to proceed with surgery. The goals of surgery would be to provide adequate pain relief and allow for mobilization. Plan for surgery is R hip percutaneous pinning later today,  10/01/2023. 2. NPO until OR 3. Hold anticoagulation in advance of OR   Lorri Rota   10/01/2023 7:28 AM

## 2023-10-01 NOTE — Plan of Care (Signed)
   Problem: Activity: Goal: Risk for activity intolerance will decrease Outcome: Progressing   Problem: Nutrition: Goal: Adequate nutrition will be maintained Outcome: Progressing   Problem: Safety: Goal: Ability to remain free from injury will improve Outcome: Progressing

## 2023-10-01 NOTE — Op Note (Signed)
 DATE OF SURGERY: 10/01/2023  PREOPERATIVE DIAGNOSIS: Right valgus impacted femoral neck fracture  POSTOPERATIVE DIAGNOSIS: Right valgus impacted femoral neck fracture  PROCEDURE: Percutaneous pinning of Right femoral neck fracture  SURGEON: Earnestine HILARIO Blanch, MD  ASSISTANTS: none  EBL: 25 cc  COMPONENTS:  Stryker 6.53mm cannulated screws x 3 with washers (80mm, 85mm, 85mm)   INDICATIONS: Eileen Walsh is a 89 y.o. female who sustained a valgus impacted femoral neck fracture after a fall. Risks and benefits of percutaneous pinning were explained to the patient and family. Risks include but are not limited to bleeding, infection, injury to tissues, nerves, vessels, DVT/PE, malunion/nonunion, hardware failure, and risks of anesthesia. The patient and/or family understand these risks, have completed an informed consent, and wish to proceed.   PROCEDURE:  The patient was brought into the operating room. After administering general anesthesia, the patient was placed in the supine position on the Hana table. The uninjured leg was extended while the injured lower extremity was placed in a neutral position with care taken to not displace the fracture during positioning. The lateral aspects of the operative hip and thigh were prepped with ChloraPrep solution before being draped sterilely. IV antibiotics were administered. A timeout was performed to verify the appropriate surgical site, patient, and procedure.   The greater trochanter was identified and an approximately 5 cm incision was made over the lateral aspect of the proximal femur. The incision was carried down through the subcutaneous tissues to expose the IT band. This was split at the proximal portion of the incision and the vastus lateralis was split in line with its fibers. The lateral aspect of the femur was cleared of soft tissue. Under fluoroscopic guidance, a guidewire was placed along the inferior aspect of the femoral neck into the head  while ensuring the start point on the lateral cortex was not below the level of the lesser trochanter. A parallel guide was used to place two additional guidepins (superior posterior and superior anterior). Position of all pins was verified fluoroscopically in both the AP and lateral views. A measuring device was used the measure appropriate screw length. The guidepins were drilled with a 3.68mm drill. Appropriately sized screws were advanced starting with the inferior screw, then superior posterior, then superior anterior. Screws were sequentially tightened. Hardware position and bony alignment was confirmed fluoroscopically with AP and lateral views.   The wounds were irrigated thoroughly with sterile saline solution. The IT band was closed with 0-Vicryl. The subcutaneous tissues were closed using 2-0 Vicryl interrupted sutures. The skin was closed using staples. Sterile occlusive dressing was applied. The patient was then transferred to the recovery room in satisfactory condition after tolerating the procedure well.  POSTOPERATIVE PLAN: The patient will be WBAT on the operative extremity. Lovenox  40mg /day x 4 weeks to start on POD#1. Ancef  x 24 hours. PT/OT on POD#1.

## 2023-10-01 NOTE — Anesthesia Preprocedure Evaluation (Addendum)
 Anesthesia Evaluation  Patient identified by MRN, date of birth, ID band Patient awake and Patient confused    Reviewed: Allergy & Precautions, NPO status , Patient's Chart, lab work & pertinent test results  History of Anesthesia Complications Negative for: history of anesthetic complications  Airway Mallampati: IV   Neck ROM: Full    Dental  (+) Implants, Upper Dentures, Missing   Pulmonary neg pulmonary ROS   Pulmonary exam normal breath sounds clear to auscultation       Cardiovascular hypertension, + CAD  Normal cardiovascular exam Rhythm:Regular Rate:Normal  ECG 07/16/23: NSR; nonspecific ST abnormality   Neuro/Psych SDH this admission without symptoms, mass effect, or midline shift per neurosurg  Neuromuscular disease (neuropathy)    GI/Hepatic negative GI ROS,,,  Endo/Other  negative endocrine ROS    Renal/GU negative Renal ROS     Musculoskeletal  (+) Arthritis ,  Fibromyalgia -  Abdominal   Peds  Hematology negative hematology ROS (+)   Anesthesia Other Findings   Reproductive/Obstetrics                             Anesthesia Physical Anesthesia Plan  ASA: 3  Anesthesia Plan: General   Post-op Pain Management:    Induction: Intravenous  PONV Risk Score and Plan: 3 and Ondansetron , Dexamethasone  and Treatment may vary due to age or medical condition  Airway Management Planned: Oral ETT  Additional Equipment:   Intra-op Plan:   Post-operative Plan: Extubation in OR  Informed Consent: I have reviewed the patients History and Physical, chart, labs and discussed the procedure including the risks, benefits and alternatives for the proposed anesthesia with the patient or authorized representative who has indicated his/her understanding and acceptance.     Dental advisory given and Consent reviewed with POA  Plan Discussed with: CRNA  Anesthesia Plan Comments:  (History and consent obtained from patient's daughter, [name], at bedside. Patient's daughter consented for risks of anesthesia including but not limited to:  - adverse reactions to medications - damage to eyes, teeth, lips or other oral mucosa - nerve damage due to positioning  - sore throat or hoarseness - damage to heart, brain, nerves, lungs, other parts of body or loss of life  Informed patient's daughter about role of CRNA in peri- and intra-operative care; she voiced understanding.)        Anesthesia Quick Evaluation

## 2023-10-01 NOTE — Progress Notes (Signed)
 Initial Nutrition Assessment  DOCUMENTATION CODES:   Not applicable  INTERVENTION:   -Obtain new wt -Once diet is advanced, add:   -Ensure Enlive po BID, each supplement provides 350 kcal and 20 grams of protein.  -MVI with minerals daily  NUTRITION DIAGNOSIS:   Increased nutrient needs related to post-op healing as evidenced by estimated needs.  GOAL:   Patient will meet greater than or equal to 90% of their needs  MONITOR:   PO intake, Supplement acceptance, Diet advancement  REASON FOR ASSESSMENT:   Consult Assessment of nutrition requirement/status, Hip fracture protocol  ASSESSMENT:   Pt with medical history significant of HTN, HLD, CAD, dCHf, GERD, Depression, fibromyalgia, SDH, neuropathy, recent right 5th toe fracture, who presents with fall and right hip and right knee pain.  Pt admitted with closed lt hip fracture.   Pt unavailable at time of visit. RD unable to obtain further nutrition-related history or complete nutrition-focused physical exam at this time.    Per orthopedics notes, plan for percutaneous pinning today. Pt is currently NPO for procedure.   Wt has been stable over the past several year. However, noted last two weights are identical, so unsure if this is a measured wt vs stated weight. RD will obtain additional weight to better assess weight changes.   Pt with increased nutritional needs for post-op healing and would benefit from addition of oral nutrition supplements.   Medications reviewed.   Lab Results  Component Value Date   HGBA1C 5.5 04/26/2017   PTA DM medications are none.   Labs reviewed: Na: 131, CBGS: 149 (inpatient orders for glycemic control are none).    Diet Order:   Diet Order             Diet NPO time specified Except for: Sips with Meds, Ice Chips  Diet effective midnight                   EDUCATION NEEDS:   No education needs have been identified at this time  Skin:  Skin Assessment: Reviewed RN  Assessment  Last BM:  Unknown  Height:   Ht Readings from Last 1 Encounters:  09/30/23 5' 7 (1.702 m)    Weight:   Wt Readings from Last 1 Encounters:  09/30/23 71.7 kg    Ideal Body Weight:  61.4 kg  BMI:  Body mass index is 24.76 kg/m.  Estimated Nutritional Needs:   Kcal:  1650-1850  Protein:  85-100 grams  Fluid:  1.6-1.8 L    Margery ORN, RD, LDN, CDCES Registered Dietitian III Certified Diabetes Care and Education Specialist If unable to reach this RD, please use RD Inpatient group chat on secure chat between hours of 8am-4 pm daily

## 2023-10-01 NOTE — Transfer of Care (Signed)
 Immediate Anesthesia Transfer of Care Note  Patient: Eileen Walsh  Procedure(s) Performed: RIGHT HIP PERCUTANEOUS PINNING (Right: Hip)  Patient Location: PACU  Anesthesia Type:General  Level of Consciousness: drowsy  Airway & Oxygen Therapy: Patient Spontanous Breathing and Patient connected to face mask oxygen  Post-op Assessment: Report given to RN and Post -op Vital signs reviewed and stable  Post vital signs: Reviewed and stable  Last Vitals:  Vitals Value Taken Time  BP 158/64 10/01/23 1601  Temp 37.2 C 10/01/23 1601  Pulse 72 10/01/23 1607  Resp 24 10/01/23 1607  SpO2 99 % 10/01/23 1607  Vitals shown include unfiled device data.  Last Pain:  Vitals:   10/01/23 1601  TempSrc:   PainSc: Asleep         Complications: No notable events documented.

## 2023-10-01 NOTE — H&P (Signed)
 H&P reviewed. No significant changes noted.

## 2023-10-02 DIAGNOSIS — S72001A Fracture of unspecified part of neck of right femur, initial encounter for closed fracture: Secondary | ICD-10-CM | POA: Diagnosis not present

## 2023-10-02 LAB — CBC
HCT: 38.5 % (ref 36.0–46.0)
Hemoglobin: 13.3 g/dL (ref 12.0–15.0)
MCH: 32 pg (ref 26.0–34.0)
MCHC: 34.5 g/dL (ref 30.0–36.0)
MCV: 92.5 fL (ref 80.0–100.0)
Platelets: 220 10*3/uL (ref 150–400)
RBC: 4.16 MIL/uL (ref 3.87–5.11)
RDW: 11.9 % (ref 11.5–15.5)
WBC: 12.1 10*3/uL — ABNORMAL HIGH (ref 4.0–10.5)
nRBC: 0 % (ref 0.0–0.2)

## 2023-10-02 LAB — BASIC METABOLIC PANEL WITH GFR
Anion gap: 9 (ref 5–15)
BUN: 22 mg/dL (ref 8–23)
CO2: 21 mmol/L — ABNORMAL LOW (ref 22–32)
Calcium: 8.7 mg/dL — ABNORMAL LOW (ref 8.9–10.3)
Chloride: 101 mmol/L (ref 98–111)
Creatinine, Ser: 1.04 mg/dL — ABNORMAL HIGH (ref 0.44–1.00)
GFR, Estimated: 50 mL/min — ABNORMAL LOW (ref >60)
Glucose, Bld: 141 mg/dL — ABNORMAL HIGH (ref 70–99)
Potassium: 5 mmol/L (ref 3.5–5.1)
Sodium: 131 mmol/L — ABNORMAL LOW (ref 135–145)

## 2023-10-02 NOTE — Evaluation (Signed)
 Physical Therapy Evaluation Patient Details Name: Eileen Walsh MRN: [id number] DOB: 1929/06/14 Today's Date: 10/02/2023  History of Present Illness  Pt is a 89 y.o. female s/p fall trying to get to the chair (c/o R hip and knee pain).  Imaging showing: Small focus of subdural hemorrhage over the right frontal convexity measuring up to 3.6 mm in maximum thickness. No significant mass effect. No midline shift.; Subcapital fracture of the right femoral head and neck junction; Angulated comminuted fracture of the distal diaphysis and neck of  the fifth metatarsal.  Pt admitted with closed R hip fx, leukocytosis, SDH (stable on repeated CT scan), and R 5th toe fx.  S/p percutaneous pinning of R femoral neck fx 10/01/23.  PMH includes htn, a-fib with RVR, CAD, htn, SDH, fibromyalgia, neuropathy, recent R 5th toe fx, breast sx.  Clinical Impression  Prior to recent medical concerns, pt was ambulatory short distances with walker (otherwise in w/c); lives at [facility] (for about the past week).  R hip/thigh pain 4/10 at rest but increased to 6-8/10 with activity (nurse notified).  Currently pt is mod assist for sit to stand transfers up to RW; min assist stand step turn transfer recliner to [facility] using RW; unable to take steps back safely with 1 assist and RW use so performed squat pivot transfer BSC to recliner with mod assist post toileting. Pt inconsistent with following 1 step cues and requiring increased time for activities.  Pt would currently benefit from skilled PT to address noted impairments and functional limitations (see below for any additional details).  Upon hospital discharge, pt would benefit from ongoing therapy.     If plan is discharge home, recommend the following: Two people to help with walking and/or transfers;A lot of help with bathing/dressing/bathroom;Assistance with cooking/housework;Assist for transportation;Help with stairs or ramp for entrance   Can travel by private vehicle    No    Equipment Recommendations  (TBD at next facility)  Recommendations for Other Services       Functional Status Assessment Patient has had a recent decline in their functional status and demonstrates the ability to make significant improvements in function in a reasonable and predictable amount of time.     Precautions / Restrictions Precautions Precautions: Fall Recall of Precautions/Restrictions: Impaired Restrictions Weight Bearing Restrictions Per Provider Order: Yes RLE Weight Bearing Per Provider Order: Weight bearing as tolerated      Mobility  Bed Mobility               General bed mobility comments: Deferred (pt in recliner beginning/end of session at rest)    Transfers Overall transfer level: Needs assistance Equipment used: Rolling walker (2 wheels) Transfers: Sit to/from Stand Sit to Stand: Mod assist   Step pivot transfers: Min assist Squat pivot transfers: Mod assist     General transfer comment: mod assist to stand from recliner x2 trials and from [facility] x2 trials (vc's for UE/LE placement and overall technique; use of RW); min assist stand step turn recliner to [facility] with RW use; pt unable to take steps with RW post toileting safely with 1 assist and RW use (to go BSC to recliner) so performed squat pivot transfer BSC to recliner to L side with mod assist    Ambulation/Gait               General Gait Details: Deferred d/t safety concerns (pt also did not have shoes present yet)  Stairs  Wheelchair Mobility     Tilt Bed    Modified Rankin (Stroke Patients Only)       Balance Overall balance assessment: Needs assistance Sitting-balance support: Bilateral upper extremity supported, Feet supported Sitting balance-Leahy Scale: Fair Sitting balance - Comments: steady static sitting   Standing balance support: Bilateral upper extremity supported, Reliant on assistive device for balance Standing balance-Leahy Scale:  Poor Standing balance comment: posterior lean noted in standing requiring assist for upright balance and cueing to correct                             Pertinent Vitals/Pain Pain Assessment Pain Assessment: 0-10 Pain Score: 4  Pain Location: R hip/thigh Pain Descriptors / Indicators: Sharp, Discomfort, Guarding Pain Intervention(s): Limited activity within patient's tolerance, Monitored during session, Premedicated before session, Repositioned, Other (comment) (RN updated on pt's pain status) Post activity pt's HR 93 bpm with SpO2 sats 96% on room air.    Home Living Family/patient expects to be discharged to:: Assisted living                 Home Equipment: Other (comment) (uses walker with sliders in front and tennis balls in back (doesn't use RW d/t getting away from her); has 3ww but doesn't use anymore) Additional Comments: [facility]    Prior Function Prior Level of Function : Needs assist             Mobility Comments: Walks short distances with walker; otherwise in w/c ADLs Comments: Assist with ADL's     Extremity/Trunk Assessment   Upper Extremity Assessment Upper Extremity Assessment: Overall WFL for tasks assessed    Lower Extremity Assessment Lower Extremity Assessment: Generalized weakness;RLE deficits/detail RLE Deficits / Details: at least 2+/5 hip flexion; at least 3-/5 knee flexion/extension; at least 3/5 ankle DF/PF RLE: Unable to fully assess due to pain       Communication   Communication Communication: Impaired Factors Affecting Communication: Hearing impaired    Cognition Arousal: Alert Behavior During Therapy: Impulsive, WFL for tasks assessed/performed   PT - Cognitive impairments: History of cognitive impairments                       PT - Cognition Comments: Oriented to person, place, and general situation (with extra time); pt reported her son and DIL were her brother and sister in law but able to easily  correct Following commands: Impaired Following commands impaired: Follows one step commands with increased time, Follows one step commands inconsistently     Cueing Cueing Techniques: Verbal cues, Tactile cues     General Comments General comments (skin integrity, edema, etc.): mild drainage noted R hip dressing    Exercises     Assessment/Plan    PT Assessment Patient needs continued PT services  PT Problem List Decreased strength;Decreased activity tolerance;Decreased balance;Decreased mobility;Decreased knowledge of use of DME;Decreased safety awareness;Decreased knowledge of precautions;Decreased skin integrity;Pain       PT Treatment Interventions DME instruction;Gait training;Functional mobility training;Therapeutic activities;Therapeutic exercise;Balance training;Patient/family education    PT Goals (Current goals can be found in the Care Plan section)  Acute Rehab PT Goals Patient Stated Goal: to improve pain and functional mobility PT Goal Formulation: With patient/family Time For Goal Achievement: 10/16/23 Potential to Achieve Goals: Fair    Frequency 7X/week     Co-evaluation               AM-PAC PT "  6 Clicks" Mobility  Outcome Measure Help needed turning from your back to your side while in a flat bed without using bedrails?: A Little Help needed moving from lying on your back to sitting on the side of a flat bed without using bedrails?: A Lot Help needed moving to and from a bed to a chair (including a wheelchair)?: A Lot Help needed standing up from a chair using your arms (e.g., wheelchair or bedside chair)?: A Lot Help needed to walk in hospital room?: Total Help needed climbing 3-5 steps with a railing? : Total 6 Click Score: 11    End of Session Equipment Utilized During Treatment: Gait belt Activity Tolerance: Patient tolerated treatment well Patient left: in chair;with call bell/phone within reach;with chair alarm set;with family/visitor  present;Other (comment) (B heels floating via pillow support) Nurse Communication: Mobility status;Precautions;Weight bearing status;Other (comment) (pt's pain status; pt voided on BSC during session (and incontinent of urine during transfers)) PT Visit Diagnosis: Other abnormalities of gait and mobility (R26.89);Unsteadiness on feet (R26.81);Muscle weakness (generalized) (M62.81);History of falling (Z91.81);Pain Pain - Right/Left: Right Pain - part of body: Hip    Time: 4540-9811 PT Time Calculation (min) (ACUTE ONLY): 44 min   Charges:   PT Evaluation $PT Eval Low Complexity: 1 Low PT Treatments $Therapeutic Activity: 23-37 mins PT General Charges $$ ACUTE PT VISIT: 1 Visit        Amador Junes, PT 10/02/23, 2:14 PM

## 2023-10-02 NOTE — Plan of Care (Signed)
  Problem: Clinical Measurements: Goal: Diagnostic test results will improve Outcome: Progressing   Problem: Nutrition: Goal: Adequate nutrition will be maintained Outcome: Progressing   Problem: Pain Managment: Goal: General experience of comfort will improve and/or be controlled Outcome: Progressing

## 2023-10-02 NOTE — Evaluation (Signed)
 Occupational Therapy Evaluation Patient Details Name: Eileen Walsh MRN: [id number] DOB: Mar 19, 1929 Today's Date: 10/02/2023   History of Present Illness   Eileen Walsh is a 89 y.o. female with medical history significant of HTN, HLD, CAD, dCHf, GERD, Depression, fibromyalgia, SDH, neuropathy, recent right 5th toe fracture, who presents to ED from [facility] with fall.  X-rays show a right minimally displaced femoral neck fracture now s/p  R hip percutaneous pinning on 10/01/23. Head CT showed a 3 mm subdural hematoma, stable on repeat imaging.     Clinical Impressions Ms Reddick was seen for OT evaluation this date. Prior to hospital admission, pt required SUPERVISION + RW and assist for ADLs. Pt lives at [facility]. Pt currently requires MAX A sup>sit, initial MOD A sitting ablance improves to CGA with time and BUE support. MAX A don B socks, MIN A don gown in sitting. MIN A + RW fsit<>stand and bed>chair t/f. Family educated on d/c recs and toileting schedule. Pt would benefit from skilled OT to address noted impairments and functional limitations (see below for any additional details). Upon hospital discharge, recommend OT follow up <3 hours/day.     If plan is discharge home, recommend the following:   A lot of help with walking and/or transfers;A lot of help with bathing/dressing/bathroom;Help with stairs or ramp for entrance;Supervision due to cognitive status     Functional Status Assessment   Patient has had a recent decline in their functional status and demonstrates the ability to make significant improvements in function in a reasonable and predictable amount of time.     Equipment Recommendations   BSC/3in1     Recommendations for Other Services         Precautions/Restrictions   Precautions Precautions: Fall Recall of Precautions/Restrictions: Impaired Restrictions Weight Bearing Restrictions Per Provider Order: Yes RLE Weight Bearing Per  Provider Order: Weight bearing as tolerated     Mobility Bed Mobility Overal bed mobility: Needs Assistance Bed Mobility: Supine to Sit     Supine to sit: Max assist          Transfers Overall transfer level: Needs assistance Equipment used: Rolling walker (2 wheels) Transfers: Sit to/from Stand, Bed to chair/wheelchair/BSC Sit to Stand: Min assist     Step pivot transfers: Min assist            Balance Overall balance assessment: Needs assistance Sitting-balance support: Bilateral upper extremity supported, Feet supported Sitting balance-Leahy Scale: Poor   Postural control: Posterior lean Standing balance support: Bilateral upper extremity supported Standing balance-Leahy Scale: Poor                             ADL either performed or assessed with clinical judgement   ADL Overall ADL's : Needs assistance/impaired                                       General ADL Comments: MIN A + RW for simulated BSC t/f. MAX A don B socks in sitting. MIN A don gown in sitting, moderate posterior lean with activity      Pertinent Vitals/Pain Pain Assessment Pain Assessment: PAINAD Breathing: normal Negative Vocalization: none Facial Expression: smiling or inexpressive Body Language: relaxed Consolability: no need to console PAINAD Score: 0 Pain Intervention(s): Ice applied     Extremity/Trunk Assessment Upper Extremity Assessment Upper Extremity Assessment:  Overall WFL for tasks assessed   Lower Extremity Assessment Lower Extremity Assessment: Generalized weakness       Communication Communication Communication: No apparent difficulties   Cognition Arousal: Alert Behavior During Therapy: Impulsive, WFL for tasks assessed/performed Cognition: History of cognitive impairments             OT - Cognition Comments: scattered and tangential speech, impulsive but redirectable to task with cues                 Following  commands: Impaired Following commands impaired: Follows one step commands with increased time     Cueing  General Comments   Cueing Techniques: Verbal cues;Tactile cues      Exercises     Shoulder Instructions      Home Living Family/patient expects to be discharged to:: Assisted living                             Home Equipment: Rolling Walker (2 wheels)   Additional Comments: [facility]      Prior Functioning/Environment Prior Level of Function : Needs assist             Mobility Comments: SUPERVISION + RW for ADL t/f      OT Problem List: Decreased strength;Decreased range of motion;Decreased activity tolerance;Impaired balance (sitting and/or standing)   OT Treatment/Interventions: Self-care/ADL training;Therapeutic exercise;Energy conservation;DME and/or AE instruction;Therapeutic activities      OT Goals(Current goals can be found in the care plan section)   Acute Rehab OT Goals Patient Stated Goal: to go home OT Goal Formulation: With patient/family Time For Goal Achievement: 10/16/23 Potential to Achieve Goals: Fair ADL Goals Pt Will Perform Grooming: with modified independence;standing Pt Will Perform Lower Body Dressing: sit to/from stand;with supervision;with caregiver independent in assisting Pt Will Transfer to Toilet: with modified independence;regular height toilet;ambulating   OT Frequency:  Min 2X/week    Co-evaluation              AM-PAC OT "6 Clicks" Daily Activity     Outcome Measure Help from another person eating meals?: None Help from another person taking care of personal grooming?: A Little Help from another person toileting, which includes using toliet, bedpan, or urinal?: A Lot Help from another person bathing (including washing, rinsing, drying)?: A Lot Help from another person to put on and taking off regular upper body clothing?: A Little Help from another person to put on and taking off regular lower  body clothing?: A Lot 6 Click Score: 16   End of Session Nurse Communication: Mobility status  Activity Tolerance: Patient tolerated treatment well Patient left: in chair;with call bell/phone within reach;with chair alarm set;with family/visitor present  OT Visit Diagnosis: Other abnormalities of gait and mobility (R26.89);Muscle weakness (generalized) (M62.81)                Time: 1610-9604 OT Time Calculation (min): 46 min Charges:  OT General Charges $OT Visit: 1 Visit OT Evaluation $OT Eval Moderate Complexity: 1 Mod OT Treatments $Self Care/Home Management : 23-37 mins  Gordan Latina, M.S. OTR/L  10/02/23, 10:55 AM  ascom [phone]

## 2023-10-02 NOTE — Progress Notes (Signed)
 PROGRESS NOTE   Eileen Walsh   ZOX:[id number] DOB: 01-Dec-1928  DOA: 09/30/2023 Date of Service: 10/02/23 which is hospital day 2  PCP: [facility] course / significant events:   HPI: Eileen Walsh is a 89 y.o. female with medical history significant of HTN, HLD, CAD, dCHf, GERD, Depression, fibromyalgia, SDH, neuropathy, recent right 5th toe fracture, who presentsto ED via [facility] from [facility] with fall and right hip and right knee pain. Also already has R 5th toe fracture, foot was in boot. Pt states that she lost her balance and fell when she was trying to sit in chair. She injured her right hip and right knee   04/17: X-ray of right knee is negative for acute fracture. X-ray of right hip showed showed possible fracture which is confirmed by CT scan. CT of head showed small subdural hematoma which is stable on repeat CT scan of head 6 hours later. Pt admitted for fall w/ hip fx, ortho to pin in AM.  04/18: repair hip fx  04/19: PT/OT recs for SNF rehab     Consultants:  Orthopedic surgery  Neurosurgery   Procedures/Surgeries: 10/01/23 Percutaneous pinning of Right femoral neck fracture - Dr Basilio Both       ASSESSMENT & PLAN:   Closed right hip fracture  As evidenced by CT scan, pt has right subcapital femoral neck fracture.  Dr. Lydia Sams is planning for OR today Pain control  PT/OT when able to (not ordered now)   Leukocytosis: WBC  12.0.  Likely due to stress-induced demargination.  Patient does not have signs of infection. Follow-up CBC   Fall at residence, initial encounter History multiple falls  PT/OT when able, expect will need readmission to SNF/rehab or LTC   SDH (subdural hematoma)  pt has small SDH which is stable on repeated CT scan. Dr Felipe Horton, neurosurgery, has reviewed. Pt can follow outpatient clinic in 2 to 4 weeks with a repeat head CT to evaluate for any expansion of her subdural hematoma given her age would avoid  prophylactic antiepileptic's as these can sometimes exacerbate or cause worsening in neurologic symptoms.  IV hydralazine  as needed for SBP> 160   Essential hypertension IV hydralazine  as needed for SBP> 160 continue home amlodipine , irbesartan    Chronic diastolic CHF (congestive heart failure)  2D echo on 12/08/2021 showed EF of 60 to 65% with grade 1 diastolic dysfunction. CHF is compensated. monitor   CAD (coronary artery disease):  no CP. Pt is not taking ASA or statin Monitor for chest pain    Chronic kidney disease, stage 3a  follow BMP   History of AF (paroxysmal atrial fibrillation)  Heart rate 80. Tele monitoring   Right 5th toe fracture: pain control as above   Dementia Stable, pleasantly confused Precautions per protocol  Perioperative Cardiac Risk:  GUPTA score perioperative myocardial infarction or cardaic arrest is 2.84%.  Optimized for surgery     No concerns based on BMI: Body mass index is 24.76 kg/m.  Underweight - under 18  overweight - 25 to 29 obese - 30 or more Class 1 obesity: BMI of 30.0 to 34 Class 2 obesity: BMI of 35.0 to 39 Class 3 obesity: BMI of 40.0 to 49 Super Morbid Obesity: BMI 50-59 Super-super Morbid Obesity: BMI 60+ Significantly low or high BMI is associated with higher medical risk.  Weight management advised as adjunct to other disease management and risk reduction treatments    DVT prophylaxis: lovenox  IV  fluids: no continuous IV fluids  Nutrition: NPO pending surgery Central lines / other devices: none  Code Status: FULL CODE ACP documentation reviewed:  none on file in [facility]  TOC needs: SNF rehab  Medical barriers to dispo: none, await placement          Subjective / Brief ROS:  Patient reports no concerns Denies CP/SOB.  Pain controlled.  Denies new weakness.    Family Communication: daughter at bedside on rounds. Called other daughter [name] 10/02/23 2:49 PM and discussed code status - family will  think about i    Objective Findings:  Vitals:   10/02/23 0241 10/02/23 0601 10/02/23 0816 10/02/23 1136  BP: (!) 136/59 (!) 142/63 (!) 151/71   Pulse: 76 78 88 93  Resp: 16 16 17    Temp: 97.8 F (36.6 C) 98.2 F (36.8 C) 97.9 F (36.6 C)   TempSrc:      SpO2: 98% 98% 99% 96%  Weight:      Height:        Intake/Output Summary (Last 24 hours) at 10/02/2023 1449 Last data filed at 10/02/2023 0951 Gross per 24 hour  Intake 768.63 ml  Output 15 ml  Net 753.63 ml   Filed Weights   09/30/23 1257  Weight: 71.7 kg    Examination:  Physical Exam Constitutional:      General: She is not in acute distress. Cardiovascular:     Rate and Rhythm: Normal rate and regular rhythm.  Pulmonary:     Effort: Pulmonary effort is normal.     Breath sounds: Normal breath sounds.  Neurological:     Mental Status: She is alert. Mental status is at baseline. She is disoriented.  Psychiatric:        Mood and Affect: Mood normal.        Behavior: Behavior normal.          Scheduled Medications:   acetaminophen   1,000 mg Oral Q8H   amLODipine   10 mg Oral Daily   And   irbesartan   300 mg Oral Daily   clobetasol  cream  1 Application Topical BID   docusate sodium   100 mg Oral BID   DULoxetine   30 mg Oral Daily   enoxaparin  (LOVENOX ) injection  40 mg Subcutaneous Q24H   feeding supplement  237 mL Oral BID BM   lidocaine   1 patch Transdermal Q24H   multivitamin with minerals  1 tablet Oral Daily    Continuous Infusions:    PRN Medications:  bisacodyl , calcium  carbonate, hydrALAZINE , HYDROmorphone  (DILAUDID ) injection, methocarbamol  **OR** methocarbamol  (ROBAXIN ) injection, metoCLOPramide  **OR** metoCLOPramide  (REGLAN ) injection, ondansetron  **OR** ondansetron  (ZOFRAN ) IV, oxyCODONE , oxyCODONE , senna-docusate, sodium phosphate , traMADol   Antimicrobials from admission:  Anti-infectives (From admission, onward)    Start     Dose/Rate Route Frequency Ordered Stop   10/01/23 2100   ceFAZolin  (ANCEF ) IVPB 2g/100 mL premix        2 g 200 mL/hr over 30 Minutes Intravenous Every 6 hours 10/01/23 1705 10/02/23 0951   10/01/23 1400  ceFAZolin  (ANCEF ) IVPB 2g/100 mL premix        2 g 200 mL/hr over 30 Minutes Intravenous  Once 09/30/23 2107 10/01/23 1520           Data Reviewed:  I have personally reviewed the following...  CBC: Recent Labs  Lab 09/30/23 2318 10/01/23 0520 10/02/23 0511  WBC 12.0* 10.2 12.1*  HGB 13.1 13.5 13.3  HCT 37.7 38.2 38.5  MCV 91.1 91.0 92.5  PLT 250 242  220   Basic Metabolic Panel: Recent Labs  Lab 09/30/23 2318 10/01/23 0520 10/02/23 0511  NA 133* 131* 131*  K 4.1 4.0 5.0  CL 103 101 101  CO2 23 23 21*  GLUCOSE 108* 102* 141*  BUN 16 14 22   CREATININE 0.78 0.82 1.04*  CALCIUM  8.8* 8.6* 8.7*   GFR: Estimated Creatinine Clearance: 33.6 mL/min (A) (by C-G formula based on SCr of 1.04 mg/dL (H)). Liver Function Tests: No results for input(s): "AST", "ALT", "ALKPHOS", "BILITOT", "PROT", "ALBUMIN" in the last 168 hours. No results for input(s): "LIPASE", "AMYLASE" in the last 168 hours. No results for input(s): "AMMONIA" in the last 168 hours. Coagulation Profile: Recent Labs  Lab 09/30/23 2318  INR 1.1   Cardiac Enzymes: No results for input(s): "CKTOTAL", "CKMB", "CKMBINDEX", "TROPONINI" in the last 168 hours. BNP (last 3 results) No results for input(s): "PROBNP" in the last 8760 hours. HbA1C: No results for input(s): "HGBA1C" in the last 72 hours. CBG: No results for input(s): "GLUCAP" in the last 168 hours. Lipid Profile: No results for input(s): "CHOL", "HDL", "LDLCALC", "TRIG", "CHOLHDL", "LDLDIRECT" in the last 72 hours. Thyroid  Function Tests: No results for input(s): "TSH", "T4TOTAL", "FREET4", "T3FREE", "THYROIDAB" in the last 72 hours. Anemia Panel: No results for input(s): "VITAMINB12", "FOLATE", "FERRITIN", "TIBC", "IRON", "RETICCTPCT" in the last 72 hours. Most Recent Urinalysis On File:      Component Value Date/Time   COLORURINE YELLOW (A) 07/16/2023 1950   APPEARANCEUR CLOUDY (A) 07/16/2023 1950   LABSPEC 1.015 07/16/2023 1950   PHURINE 5.0 07/16/2023 1950   GLUCOSEU NEGATIVE 07/16/2023 1950   HGBUR NEGATIVE 07/16/2023 1950   BILIRUBINUR NEGATIVE 07/16/2023 1950   KETONESUR NEGATIVE 07/16/2023 1950   PROTEINUR 30 (A) 07/16/2023 1950   NITRITE NEGATIVE 07/16/2023 1950   LEUKOCYTESUR MODERATE (A) 07/16/2023 1950   Sepsis Labs: @LABRCNTIP (procalcitonin:4,lacticidven:4) Microbiology: No results found for this or any previous visit (from the past 240 hours).    Radiology Studies last 3 days: DG FEMUR, MIN 2 VIEWS RIGHT Result Date: 10/01/2023 CLINICAL DATA:  Right hip pending EXAM: Intraoperative fluoroscopy COMPARISON:  X-ray and CT 09/30/2023 FINDINGS: Six fluoroscopic spot images submitted for review demonstrates placement of 3 longitudinal screws transfixing the subcapital femoral neck fracture. Imaging was obtained to aid in treatment. Please correlate with real-time fluoroscopy of 48 seconds. Cumulative dose 7.53 mGy. IMPRESSION: Intraoperative fluoroscopy Electronically Signed   By: Adrianna Horde M.D.   On: 10/01/2023 20:01   DG Pelvis Portable Result Date: 10/01/2023 CLINICAL DATA:  Right hip fracture. EXAM: PORTABLE PELVIS 1-2 VIEWS COMPARISON:  CT of the right hip dated 09/30/2023. FINDINGS: Subcapital fracture of the right femoral head and neck junction is again noted. The right femoral head is seated within the acetabulum. Sacroiliac joints and pubic symphysis are anatomically aligned. IMPRESSION: Subcapital fracture of the right femoral head and neck junction. Electronically Signed   By: Mannie Seek M.D.   On: 10/01/2023 15:57   DG C-Arm 1-60 Min-No Report Result Date: 10/01/2023 Fluoroscopy was utilized by the requesting physician.  No radiographic interpretation.   DG Foot Complete Right Result Date: 09/30/2023 CLINICAL DATA:  Foot injury. EXAM: RIGHT FOOT  COMPLETE - 3+ VIEW COMPARISON:  None Available. FINDINGS: The bones are osteopenic. There is an angulated comminuted fracture of the distal diaphysis and neck of the fifth metatarsal. This is age indeterminate and may be acute. There is apex lateral angulation and 1 after chest with medial displacement of the distal fracture fragment. There is no  dislocation. There is a healed distal fourth metatarsal fracture. There are moderate degenerative changes at the first metatarsophalangeal joint with Alex valgus. There is soft tissue swelling of the dorsum of the foot. IMPRESSION: 1. Angulated comminuted fracture of the distal diaphysis and neck of the fifth metatarsal. This is age indeterminate and may be acute. 2. Healed distal fourth metatarsal fracture. 3. Moderate degenerative changes at the first metatarsophalangeal joint with mild hallux valgus. Electronically Signed   By: Tyron Gallon M.D.   On: 09/30/2023 21:53   CT Head Wo Contrast Result Date: 09/30/2023 CLINICAL DATA:  Subdural hematoma. EXAM: CT HEAD WITHOUT CONTRAST TECHNIQUE: Contiguous axial images were obtained from the base of the skull through the vertex without intravenous contrast. RADIATION DOSE REDUCTION: This exam was performed according to the departmental dose-optimization program which includes automated exposure control, adjustment of the mA and/or kV according to patient size and/or use of iterative reconstruction technique. COMPARISON:  CT head from earlier today. FINDINGS: Brain: Unchanged small subdural hemorrhage along the right cerebral convexity without substantial mass effect. No midline shift. Similar patchy white matter hypodensities, compatible with chronic microvascular disease. No evidence of acute large vascular territory infarct, mass lesion, or hydrocephalus. Vascular: Calcific atherosclerosis.  No hyperdense vessel. Skull: No acute fracture. Sinuses/Orbits: Clear sinuses.  No acute orbital findings. Other: No mastoid  effusions. IMPRESSION: Unchanged small subdural hemorrhage along the right cerebral convexity without substantial mass effect. Electronically Signed   By: Stevenson Elbe M.D.   On: 09/30/2023 21:49   CT Hip Right Wo Contrast Result Date: 09/30/2023 CLINICAL DATA:  Fall, right hip pain. EXAM: CT OF THE RIGHT HIP WITHOUT CONTRAST TECHNIQUE: Multidetector CT imaging of the right hip was performed according to the standard protocol. Multiplanar CT image reconstructions were also generated. RADIATION DOSE REDUCTION: This exam was performed according to the departmental dose-optimization program which includes automated exposure control, adjustment of the mA and/or kV according to patient size and/or use of iterative reconstruction technique. COMPARISON:  Plain films today. FINDINGS: Bones/Joint/Cartilage There is a subcapital right femoral neck fracture with minimal displacement/angulation. No subluxation or dislocation. Ligaments Suboptimally assessed by CT. Muscles and Tendons Negative Soft tissues Negative IMPRESSION: Right subcapital femoral neck fracture. Electronically Signed   By: Janeece Mechanic M.D.   On: 09/30/2023 19:20   CT HEAD WO CONTRAST ( ) Addendum Date: 09/30/2023 ADDENDUM REPORT: 09/30/2023 16:43 ADDENDUM: These results were called by telephone at the time of interpretation on 09/30/2023 at 4:28 pm to provider Dr. Vicenta Graft, Who verbally acknowledged these results. Electronically Signed   By: Denny Flack M.D.   On: 09/30/2023 16:43   Result Date: 09/30/2023 CLINICAL DATA:  Trauma, fall with possible head strike. EXAM: CT HEAD WITHOUT CONTRAST CT CERVICAL SPINE WITHOUT CONTRAST TECHNIQUE: Multidetector CT imaging of the head and cervical spine was performed following the standard protocol without intravenous contrast. Multiplanar CT image reconstructions of the cervical spine were also generated. RADIATION DOSE REDUCTION: This exam was performed according to the departmental dose-optimization  program which includes automated exposure control, adjustment of the mA and/or kV according to patient size and/or use of iterative reconstruction technique. COMPARISON:  07/16/2023. FINDINGS: CT HEAD FINDINGS Brain: Focus of isoattenuating to slightly hyperattenuating fluid overlying the right frontal convexity measuring up to 3.6 mm in maximum thickness. No significant mass effect. No midline shift. No evidence of parenchymal hemorrhage. Nonspecific hypoattenuation in the periventricular and subcortical white matter favored to reflect chronic microvascular ischemic changes. No CT evidence of acute infarct. The  basilar cisterns are patent. Ventricles: Prominence of the ventricles suggesting underlying parenchymal volume loss. Vascular: Atherosclerotic calcifications of the carotid siphons and intracranial vertebral arteries. No hyperdense vessel. Skull: No acute or aggressive finding. Orbits: Orbits are symmetric. Sinuses: The visualized paranasal sinuses are clear. Other: Mastoid air cells are clear. Traumatic Brain Injury Risk Stratification Skull Fracture: No - Low/mBIG 1 Subdural Hematoma (SDH): <39mm - mBIG 1 Subarachnoid Hemorrhage [facility]): No Epidural Hematoma (EDH): No - Low/mBIG 1 Cerebral contusion, intra-axial, intraparenchymal Hemorrhage (IPH): No Intraventricular Hemorrhage (IVH): No - Low/mBIG 1 Midline Shift > 1mm or Edema/effacement of sulci/vents: No - Low/mBIG 1 ---------------------------------------------------- CT CERVICAL SPINE FINDINGS Alignment: Cervical lordosis is maintained. Trace anterolisthesis of C3 on C4 and C4 on C5. Additional 2 mm anterolisthesis of C5 on C6. No facet subluxation or dislocation. Skull base and vertebrae: No compression fracture or displaced fracture in the cervical spine. No suspicious osseous lesion. Soft tissues and spinal canal: No prevertebral fluid or swelling. No visible canal hematoma. Disc levels: Intervertebral disc space narrowing at multiple levels. No  high-grade osseous spinal canal stenosis. Facet arthrosis at multiple levels. Foraminal narrowing is most pronounced at C4-5. Upper chest: Negative. Other: None. IMPRESSION: Small focus of subdural hemorrhage over the right frontal convexity measuring up to 3.6 mm in maximum thickness. No significant mass effect. No midline shift. No acute fracture or traumatic malalignment of the cervical spine. Similar chronic and degenerative changes as above. Electronically Signed: By: Denny Flack M.D. On: 09/30/2023 16:24   CT Cervical Spine Wo Contrast Addendum Date: 09/30/2023 ADDENDUM REPORT: 09/30/2023 16:43 ADDENDUM: These results were called by telephone at the time of interpretation on 09/30/2023 at 4:28 pm to provider Dr. Vicenta Graft, Who verbally acknowledged these results. Electronically Signed   By: Denny Flack M.D.   On: 09/30/2023 16:43   Result Date: 09/30/2023 CLINICAL DATA:  Trauma, fall with possible head strike. EXAM: CT HEAD WITHOUT CONTRAST CT CERVICAL SPINE WITHOUT CONTRAST TECHNIQUE: Multidetector CT imaging of the head and cervical spine was performed following the standard protocol without intravenous contrast. Multiplanar CT image reconstructions of the cervical spine were also generated. RADIATION DOSE REDUCTION: This exam was performed according to the departmental dose-optimization program which includes automated exposure control, adjustment of the mA and/or kV according to patient size and/or use of iterative reconstruction technique. COMPARISON:  07/16/2023. FINDINGS: CT HEAD FINDINGS Brain: Focus of isoattenuating to slightly hyperattenuating fluid overlying the right frontal convexity measuring up to 3.6 mm in maximum thickness. No significant mass effect. No midline shift. No evidence of parenchymal hemorrhage. Nonspecific hypoattenuation in the periventricular and subcortical white matter favored to reflect chronic microvascular ischemic changes. No CT evidence of acute infarct. The  basilar cisterns are patent. Ventricles: Prominence of the ventricles suggesting underlying parenchymal volume loss. Vascular: Atherosclerotic calcifications of the carotid siphons and intracranial vertebral arteries. No hyperdense vessel. Skull: No acute or aggressive finding. Orbits: Orbits are symmetric. Sinuses: The visualized paranasal sinuses are clear. Other: Mastoid air cells are clear. Traumatic Brain Injury Risk Stratification Skull Fracture: No - Low/mBIG 1 Subdural Hematoma (SDH): <24mm - mBIG 1 Subarachnoid Hemorrhage [facility]): No Epidural Hematoma (EDH): No - Low/mBIG 1 Cerebral contusion, intra-axial, intraparenchymal Hemorrhage (IPH): No Intraventricular Hemorrhage (IVH): No - Low/mBIG 1 Midline Shift > 1mm or Edema/effacement of sulci/vents: No - Low/mBIG 1 ---------------------------------------------------- CT CERVICAL SPINE FINDINGS Alignment: Cervical lordosis is maintained. Trace anterolisthesis of C3 on C4 and C4 on C5. Additional 2 mm anterolisthesis of C5 on C6. No facet subluxation  or dislocation. Skull base and vertebrae: No compression fracture or displaced fracture in the cervical spine. No suspicious osseous lesion. Soft tissues and spinal canal: No prevertebral fluid or swelling. No visible canal hematoma. Disc levels: Intervertebral disc space narrowing at multiple levels. No high-grade osseous spinal canal stenosis. Facet arthrosis at multiple levels. Foraminal narrowing is most pronounced at C4-5. Upper chest: Negative. Other: None. IMPRESSION: Small focus of subdural hemorrhage over the right frontal convexity measuring up to 3.6 mm in maximum thickness. No significant mass effect. No midline shift. No acute fracture or traumatic malalignment of the cervical spine. Similar chronic and degenerative changes as above. Electronically Signed: By: Denny Flack M.D. On: 09/30/2023 16:24   DG Hip Unilat W or Wo Pelvis 2-3 Views Right Result Date: 09/30/2023 CLINICAL DATA:  Pain after fall  EXAM: DG HIP (WITH OR WITHOUT PELVIS) 2-3V RIGHT COMPARISON:  None Available. FINDINGS: Osteopenia. Preserved joint spaces. Mild hypertrophic changes along the pubic symphysis. There is subtle irregularity along the cortex and along the trabecula of the subcapital right femoral neck. There is also cortical irregularity with step-off laterally about the femoral head/neck. Worrisome for nondisplaced fracture. Elsewhere there presumed vascular calcifications in the pelvis. IMPRESSION: Severe osteopenia. Irregularity along the subcapital right femoral neck with a step-off laterally on the frontal view of the hip, worrisome for nondisplaced fracture. Electronically Signed   By: Adrianna Horde M.D.   On: 09/30/2023 16:20   DG Knee Complete 4 Views Right Result Date: 09/30/2023 CLINICAL DATA:  Pain after fall EXAM: RIGHT KNEE - COMPLETE 2 VIEW COMPARISON:  None Available. FINDINGS: Osteopenia. No fracture or dislocation. Preserved joint spaces. Minimal osteophytes of the medial compartment. No joint effusion on lateral view. Vascular calcifications seen posterior to the knee. IMPRESSION: Osteopenia.  Slight degenerative changes. Electronically Signed   By: Adrianna Horde M.D.   On: 09/30/2023 16:09         Buryl Bamber, DO [facility] 10/02/2023, 2:49 PM    Dictation software may have been used to generate the above note. Typos may occur and escape review in typed/dictated notes. Please contact Dr Authur Leghorn directly for clarity if needed.  Staff may message me via secure chat in Epic  but this may not receive an immediate response,  please page me for urgent matters!  If 7PM-7AM, please contact night coverage [email or web address]

## 2023-10-02 NOTE — Progress Notes (Signed)
 Subjective: 1 Day Post-Op Procedure(s) (LRB): RIGHT HIP PERCUTANEOUS PINNING (Right) Patient reports pain as mild.   Patient seen in rounds with Dr. Daun Epstein. Patient is well, and has had no acute complaints or problems. Denies any CP, SOB, N/V, fevers or chills We will start therapy today.  Plan is to go Skilled nursing facility after hospital stay.  Objective: Vital signs in last 24 hours: Temp:  [97.7 F (36.5 C)-99 F (37.2 C)] 97.9 F (36.6 C) (04/19 0816) Pulse Rate:  [70-93] 93 (04/19 1136) Resp:  [14-17] 17 (04/19 0816) BP: (124-174)/(59-93) 151/71 (04/19 0816) SpO2:  [91 %-99 %] 96 % (04/19 1136)  Intake/Output from previous day:  Intake/Output Summary (Last 24 hours) at 10/02/2023 1142 Last data filed at 10/02/2023 0951 Gross per 24 hour  Intake 768.63 ml  Output 15 ml  Net 753.63 ml    Intake/Output this shift: Total I/O In: 98.6 [IV Piggyback:98.6] Out: -   Labs: Recent Labs    09/30/23 2318 10/01/23 0520 10/02/23 0511  HGB 13.1 13.5 13.3   Recent Labs    10/01/23 0520 10/02/23 0511  WBC 10.2 12.1*  RBC 4.20 4.16  HCT 38.2 38.5  PLT 242 220   Recent Labs    10/01/23 0520 10/02/23 0511  NA 131* 131*  K 4.0 5.0  CL 101 101  CO2 23 21*  BUN 14 22  CREATININE 0.82 1.04*  GLUCOSE 102* 141*  CALCIUM  8.6* 8.7*   Recent Labs    09/30/23 2318  INR 1.1    EXAM General - Patient is Alert and pleasantly confused, able to answer some questions and movements with prompting Extremity - Neurologically intact Neurovascular intact Sensation intact distally Intact pulses distally Dorsiflexion/Plantar flexion intact No cellulitis present Compartment soft Dressing - dressing C/D/I and no drainage Motor Function - intact, moving foot and toes well on exam.   Past Medical History:  Diagnosis Date   Arthritis    COPD (chronic obstructive pulmonary disease) (HCC)    Fibromyalgia    Hypertension    Macular degeneration     Assessment/Plan: 1  Day Post-Op Procedure(s) (LRB): RIGHT HIP PERCUTANEOUS PINNING (Right) Principal Problem:   Closed right hip fracture (HCC) Active Problems:   Essential hypertension   CAD (coronary artery disease)   AF (paroxysmal atrial fibrillation) (HCC)   SDH (subdural hematoma) (HCC)   Fall at home, initial encounter   Chronic diastolic CHF (congestive heart failure) (HCC)   Chronic kidney disease, stage 3a (HCC)   Hip fracture (HCC)  Estimated body mass index is 24.76 kg/m as calculated from the following:   Height as of this encounter: 5\' 7"  (1.702 m).   Weight as of this encounter: 71.7 kg. Advance diet Up with therapy Patient will continue to work with physical therapy Discussed with the patient continuing to utilize ice over the bandage Honeycomb bandage intact without drainage VSS Labs show hemoglobin of 13.3 Plan to DC to SNF vs back to assisted living facility  Weight-Bearing as tolerated to right leg DVT prophylaxis: Lovenox  and SCDs Patient will follow-up with [facility] orth[facility]ics in 2 weeks for re-imaging and reevaluation  Wadie Guile, PA-C [facility] 10/02/2023, 11:42 AM

## 2023-10-03 DIAGNOSIS — S72001A Fracture of unspecified part of neck of right femur, initial encounter for closed fracture: Secondary | ICD-10-CM | POA: Diagnosis not present

## 2023-10-03 LAB — CBC WITH DIFFERENTIAL/PLATELET
Abs Immature Granulocytes: 0.08 10*3/uL — ABNORMAL HIGH (ref 0.00–0.07)
Basophils Absolute: 0 10*3/uL (ref 0.0–0.1)
Basophils Relative: 0 %
Eosinophils Absolute: 0 10*3/uL (ref 0.0–0.5)
Eosinophils Relative: 0 %
HCT: 38.8 % (ref 36.0–46.0)
Hemoglobin: 13.1 g/dL (ref 12.0–15.0)
Immature Granulocytes: 0 %
Lymphocytes Relative: 5 %
Lymphs Abs: 1 10*3/uL (ref 0.7–4.0)
MCH: 32 pg (ref 26.0–34.0)
MCHC: 33.8 g/dL (ref 30.0–36.0)
MCV: 94.6 fL (ref 80.0–100.0)
Monocytes Absolute: 1.2 10*3/uL — ABNORMAL HIGH (ref 0.1–1.0)
Monocytes Relative: 6 %
Neutro Abs: 17.8 10*3/uL — ABNORMAL HIGH (ref 1.7–7.7)
Neutrophils Relative %: 89 %
Platelets: 266 10*3/uL (ref 150–400)
RBC: 4.1 MIL/uL (ref 3.87–5.11)
RDW: 12.2 % (ref 11.5–15.5)
WBC: 20.1 10*3/uL — ABNORMAL HIGH (ref 4.0–10.5)
nRBC: 0 % (ref 0.0–0.2)

## 2023-10-03 LAB — CBC
HCT: 37.6 % (ref 36.0–46.0)
Hemoglobin: 13.1 g/dL (ref 12.0–15.0)
MCH: 31.5 pg (ref 26.0–34.0)
MCHC: 34.8 g/dL (ref 30.0–36.0)
MCV: 90.4 fL (ref 80.0–100.0)
Platelets: 269 10*3/uL (ref 150–400)
RBC: 4.16 MIL/uL (ref 3.87–5.11)
RDW: 12.1 % (ref 11.5–15.5)
WBC: 20.1 10*3/uL — ABNORMAL HIGH (ref 4.0–10.5)
nRBC: 0 % (ref 0.0–0.2)

## 2023-10-03 LAB — BASIC METABOLIC PANEL WITH GFR
Anion gap: 11 (ref 5–15)
BUN: 34 mg/dL — ABNORMAL HIGH (ref 8–23)
CO2: 23 mmol/L (ref 22–32)
Calcium: 9 mg/dL (ref 8.9–10.3)
Chloride: 100 mmol/L (ref 98–111)
Creatinine, Ser: 0.97 mg/dL (ref 0.44–1.00)
GFR, Estimated: 55 mL/min — ABNORMAL LOW (ref >60)
Glucose, Bld: 124 mg/dL — ABNORMAL HIGH (ref 70–99)
Potassium: 4.4 mmol/L (ref 3.5–5.1)
Sodium: 134 mmol/L — ABNORMAL LOW (ref 135–145)

## 2023-10-03 NOTE — Progress Notes (Addendum)
 PROGRESS NOTE   Eileen Walsh   XBJ:[id number] DOB: 1929-02-25  DOA: 09/30/2023 Date of Service: 10/03/23 which is hospital day 3  PCP: [facility] course / significant events:   HPI: Eileen Walsh is a 89 y.o. female with medical history significant of HTN, HLD, CAD, dCHf, GERD, Depression, fibromyalgia, SDH, neuropathy, recent right 5th toe fracture, who presentsto ED via [facility] from [facility] with fall and right hip and right knee pain. Also already has R 5th toe fracture, foot was in boot. Pt states that she lost her balance and fell when she was trying to sit in chair. She injured her right hip and right knee   04/17: X-ray of right knee is negative for acute fracture. X-ray of right hip showed showed possible fracture which is confirmed by CT scan. CT of head showed small subdural hematoma which is stable on repeat CT scan of head 6 hours later. Pt admitted for fall w/ hip fx, ortho to pin in AM.  04/18: repair hip fx  04/19-04/20: PT/OT recs for SNF rehab, placement pending     Consultants:  Orthopedic surgery  Neurosurgery   Procedures/Surgeries: 10/01/23 Percutaneous pinning of Right femoral neck fracture - Dr Basilio Both       ASSESSMENT & PLAN:   Closed right hip fracture  As evidenced by CT scan, pt has right subcapital femoral neck fracture.  Dr. Lydia Sams is planning for OR today Pain control  PT/OT when able to (not ordered now)   Leukocytosis Likely due to stress-induced demargination.  Patient does not have signs of infection. Follow-up CBC, added differential   Fall at residence, initial encounter History multiple falls  PT/OT when able, expect will need readmission to SNF/rehab or LTC   SDH (subdural hematoma)  pt has small SDH which is stable on repeated CT scan. Dr Felipe Horton, neurosurgery, has reviewed. Pt can follow outpatient clinic in 2 to 4 weeks with a repeat head CT to evaluate for any expansion of her subdural  hematoma given her age would avoid prophylactic antiepileptic's as these can sometimes exacerbate or cause worsening in neurologic symptoms.  IV hydralazine  as needed for SBP> 160   Essential hypertension IV hydralazine  as needed for SBP> 160 continue home amlodipine , irbesartan    Chronic diastolic CHF (congestive heart failure)  2D echo on 12/08/2021 showed EF of 60 to 65% with grade 1 diastolic dysfunction. CHF is compensated. monitor   CAD (coronary artery disease):  no CP. Pt is not taking ASA or statin Monitor for chest pain    Chronic kidney disease, stage 3a  follow BMP   History of AF (paroxysmal atrial fibrillation)  Heart rate 80. Tele monitoring   Right 5th toe fracture: pain control as above   Dementia Stable, pleasantly confused Precautions per protocol  Perioperative Cardiac Risk:  GUPTA score perioperative myocardial infarction or cardaic arrest is 2.84%.  Optimized for surgery     No concerns based on BMI: Body mass index is 24.76 kg/m.  Underweight - under 18  overweight - 25 to 29 obese - 30 or more Class 1 obesity: BMI of 30.0 to 34 Class 2 obesity: BMI of 35.0 to 39 Class 3 obesity: BMI of 40.0 to 49 Super Morbid Obesity: BMI 50-59 Super-super Morbid Obesity: BMI 60+ Significantly low or high BMI is associated with higher medical risk.  Weight management advised as adjunct to other disease management and risk reduction treatments    DVT prophylaxis: lovenox  IV  fluids: no continuous IV fluids  Nutrition: NPO pending surgery Central lines / other devices: none  Code Status: FULL CODE ACP documentation reviewed:  none on file in [facility]  TOC needs: SNF rehab  Medical barriers to dispo: none, await placement          Subjective / Brief ROS:  Patient reports hip pain worse today Denies CP/SOB, denies cough, denies fever/chills, denies dysuria/frequency    Family Communication: none at this time     Objective  Findings:  Vitals:   10/02/23 1653 10/02/23 2058 10/03/23 0442 10/03/23 0808  BP: (!) 145/48 (!) 152/59 (!) 148/71 (!) 139/53  Pulse: 77 81 77 73  Resp: 16 18 18 16   Temp: 98 F (36.7 C) 97.9 F (36.6 C) 98 F (36.7 C) 97.8 F (36.6 C)  TempSrc:      SpO2: 93% 97% 97% 96%  Weight:      Height:        Intake/Output Summary (Last 24 hours) at 10/03/2023 1444 Last data filed at 10/03/2023 1023 Gross per 24 hour  Intake 477 ml  Output 700 ml  Net -223 ml   Filed Weights   09/30/23 1257  Weight: 71.7 kg    Examination:  Physical Exam Constitutional:      General: She is not in acute distress. Cardiovascular:     Rate and Rhythm: Normal rate and regular rhythm.  Pulmonary:     Effort: Pulmonary effort is normal.     Breath sounds: Normal breath sounds.  Neurological:     Mental Status: She is alert. Mental status is at baseline. She is disoriented.  Psychiatric:        Mood and Affect: Mood normal.        Behavior: Behavior normal.          Scheduled Medications:   acetaminophen   1,000 mg Oral Q8H   amLODipine   10 mg Oral Daily   And   irbesartan   300 mg Oral Daily   clobetasol  cream  1 Application Topical BID   docusate sodium   100 mg Oral BID   DULoxetine   30 mg Oral Daily   enoxaparin  (LOVENOX ) injection  40 mg Subcutaneous Q24H   feeding supplement  237 mL Oral BID BM   lidocaine   1 patch Transdermal Q24H   multivitamin with minerals  1 tablet Oral Daily    Continuous Infusions:    PRN Medications:  bisacodyl , calcium  carbonate, hydrALAZINE , HYDROmorphone  (DILAUDID ) injection, methocarbamol  **OR** methocarbamol  (ROBAXIN ) injection, metoCLOPramide  **OR** metoCLOPramide  (REGLAN ) injection, ondansetron  **OR** ondansetron  (ZOFRAN ) IV, oxyCODONE , oxyCODONE , senna-docusate, sodium phosphate , traMADol   Antimicrobials from admission:  Anti-infectives (From admission, onward)    Start     Dose/Rate Route Frequency Ordered Stop   10/01/23 2100  ceFAZolin   (ANCEF ) IVPB 2g/100 mL premix        2 g 200 mL/hr over 30 Minutes Intravenous Every 6 hours 10/01/23 1705 10/02/23 0951   10/01/23 1400  ceFAZolin  (ANCEF ) IVPB 2g/100 mL premix        2 g 200 mL/hr over 30 Minutes Intravenous  Once 09/30/23 2107 10/01/23 1520           Data Reviewed:  I have personally reviewed the following...  CBC: Recent Labs  Lab 09/30/23 2318 10/01/23 0520 10/02/23 0511 10/03/23 0457  WBC 12.0* 10.2 12.1* 20.1*  HGB 13.1 13.5 13.3 13.1  HCT 37.7 38.2 38.5 37.6  MCV 91.1 91.0 92.5 90.4  PLT 250 242 220 269   Basic Metabolic  Panel: Recent Labs  Lab 09/30/23 2318 10/01/23 0520 10/02/23 0511 10/03/23 0457  NA 133* 131* 131* 134*  K 4.1 4.0 5.0 4.4  CL 103 101 101 100  CO2 23 23 21* 23  GLUCOSE 108* 102* 141* 124*  BUN 16 14 22  34*  CREATININE 0.78 0.82 1.04* 0.97  CALCIUM  8.8* 8.6* 8.7* 9.0   GFR: Estimated Creatinine Clearance: 36 mL/min (by C-G formula based on SCr of 0.97 mg/dL). Liver Function Tests: No results for input(s): "AST", "ALT", "ALKPHOS", "BILITOT", "PROT", "ALBUMIN" in the last 168 hours. No results for input(s): "LIPASE", "AMYLASE" in the last 168 hours. No results for input(s): "AMMONIA" in the last 168 hours. Coagulation Profile: Recent Labs  Lab 09/30/23 2318  INR 1.1   Cardiac Enzymes: No results for input(s): "CKTOTAL", "CKMB", "CKMBINDEX", "TROPONINI" in the last 168 hours. BNP (last 3 results) No results for input(s): "PROBNP" in the last 8760 hours. HbA1C: No results for input(s): "HGBA1C" in the last 72 hours. CBG: No results for input(s): "GLUCAP" in the last 168 hours. Lipid Profile: No results for input(s): "CHOL", "HDL", "LDLCALC", "TRIG", "CHOLHDL", "LDLDIRECT" in the last 72 hours. Thyroid  Function Tests: No results for input(s): "TSH", "T4TOTAL", "FREET4", "T3FREE", "THYROIDAB" in the last 72 hours. Anemia Panel: No results for input(s): "VITAMINB12", "FOLATE", "FERRITIN", "TIBC", "IRON",  "RETICCTPCT" in the last 72 hours. Most Recent Urinalysis On File:     Component Value Date/Time   COLORURINE YELLOW (A) 07/16/2023 1950   APPEARANCEUR CLOUDY (A) 07/16/2023 1950   LABSPEC 1.015 07/16/2023 1950   PHURINE 5.0 07/16/2023 1950   GLUCOSEU NEGATIVE 07/16/2023 1950   HGBUR NEGATIVE 07/16/2023 1950   BILIRUBINUR NEGATIVE 07/16/2023 1950   KETONESUR NEGATIVE 07/16/2023 1950   PROTEINUR 30 (A) 07/16/2023 1950   NITRITE NEGATIVE 07/16/2023 1950   LEUKOCYTESUR MODERATE (A) 07/16/2023 1950   Sepsis Labs: @LABRCNTIP (procalcitonin:4,lacticidven:4) Microbiology: No results found for this or any previous visit (from the past 240 hours).    Radiology Studies last 3 days: DG FEMUR, MIN 2 VIEWS RIGHT Result Date: 10/01/2023 CLINICAL DATA:  Right hip pending EXAM: Intraoperative fluoroscopy COMPARISON:  X-ray and CT 09/30/2023 FINDINGS: Six fluoroscopic spot images submitted for review demonstrates placement of 3 longitudinal screws transfixing the subcapital femoral neck fracture. Imaging was obtained to aid in treatment. Please correlate with real-time fluoroscopy of 48 seconds. Cumulative dose 7.53 mGy. IMPRESSION: Intraoperative fluoroscopy Electronically Signed   By: Adrianna Horde M.D.   On: 10/01/2023 20:01   DG Pelvis Portable Result Date: 10/01/2023 CLINICAL DATA:  Right hip fracture. EXAM: PORTABLE PELVIS 1-2 VIEWS COMPARISON:  CT of the right hip dated 09/30/2023. FINDINGS: Subcapital fracture of the right femoral head and neck junction is again noted. The right femoral head is seated within the acetabulum. Sacroiliac joints and pubic symphysis are anatomically aligned. IMPRESSION: Subcapital fracture of the right femoral head and neck junction. Electronically Signed   By: Mannie Seek M.D.   On: 10/01/2023 15:57   DG C-Arm 1-60 Min-No Report Result Date: 10/01/2023 Fluoroscopy was utilized by the requesting physician.  No radiographic interpretation.   DG Foot Complete  Right Result Date: 09/30/2023 CLINICAL DATA:  Foot injury. EXAM: RIGHT FOOT COMPLETE - 3+ VIEW COMPARISON:  None Available. FINDINGS: The bones are osteopenic. There is an angulated comminuted fracture of the distal diaphysis and neck of the fifth metatarsal. This is age indeterminate and may be acute. There is apex lateral angulation and 1 after chest with medial displacement of the distal fracture fragment.  There is no dislocation. There is a healed distal fourth metatarsal fracture. There are moderate degenerative changes at the first metatarsophalangeal joint with Alex valgus. There is soft tissue swelling of the dorsum of the foot. IMPRESSION: 1. Angulated comminuted fracture of the distal diaphysis and neck of the fifth metatarsal. This is age indeterminate and may be acute. 2. Healed distal fourth metatarsal fracture. 3. Moderate degenerative changes at the first metatarsophalangeal joint with mild hallux valgus. Electronically Signed   By: Tyron Gallon M.D.   On: 09/30/2023 21:53   CT Head Wo Contrast Result Date: 09/30/2023 CLINICAL DATA:  Subdural hematoma. EXAM: CT HEAD WITHOUT CONTRAST TECHNIQUE: Contiguous axial images were obtained from the base of the skull through the vertex without intravenous contrast. RADIATION DOSE REDUCTION: This exam was performed according to the departmental dose-optimization program which includes automated exposure control, adjustment of the mA and/or kV according to patient size and/or use of iterative reconstruction technique. COMPARISON:  CT head from earlier today. FINDINGS: Brain: Unchanged small subdural hemorrhage along the right cerebral convexity without substantial mass effect. No midline shift. Similar patchy white matter hypodensities, compatible with chronic microvascular disease. No evidence of acute large vascular territory infarct, mass lesion, or hydrocephalus. Vascular: Calcific atherosclerosis.  No hyperdense vessel. Skull: No acute fracture.  Sinuses/Orbits: Clear sinuses.  No acute orbital findings. Other: No mastoid effusions. IMPRESSION: Unchanged small subdural hemorrhage along the right cerebral convexity without substantial mass effect. Electronically Signed   By: Stevenson Elbe M.D.   On: 09/30/2023 21:49   CT Hip Right Wo Contrast Result Date: 09/30/2023 CLINICAL DATA:  Fall, right hip pain. EXAM: CT OF THE RIGHT HIP WITHOUT CONTRAST TECHNIQUE: Multidetector CT imaging of the right hip was performed according to the standard protocol. Multiplanar CT image reconstructions were also generated. RADIATION DOSE REDUCTION: This exam was performed according to the departmental dose-optimization program which includes automated exposure control, adjustment of the mA and/or kV according to patient size and/or use of iterative reconstruction technique. COMPARISON:  Plain films today. FINDINGS: Bones/Joint/Cartilage There is a subcapital right femoral neck fracture with minimal displacement/angulation. No subluxation or dislocation. Ligaments Suboptimally assessed by CT. Muscles and Tendons Negative Soft tissues Negative IMPRESSION: Right subcapital femoral neck fracture. Electronically Signed   By: Janeece Mechanic M.D.   On: 09/30/2023 19:20   CT HEAD WO CONTRAST ( ) Addendum Date: 09/30/2023 ADDENDUM REPORT: 09/30/2023 16:43 ADDENDUM: These results were called by telephone at the time of interpretation on 09/30/2023 at 4:28 pm to provider Dr. Vicenta Graft, Who verbally acknowledged these results. Electronically Signed   By: Denny Flack M.D.   On: 09/30/2023 16:43   Result Date: 09/30/2023 CLINICAL DATA:  Trauma, fall with possible head strike. EXAM: CT HEAD WITHOUT CONTRAST CT CERVICAL SPINE WITHOUT CONTRAST TECHNIQUE: Multidetector CT imaging of the head and cervical spine was performed following the standard protocol without intravenous contrast. Multiplanar CT image reconstructions of the cervical spine were also generated. RADIATION DOSE  REDUCTION: This exam was performed according to the departmental dose-optimization program which includes automated exposure control, adjustment of the mA and/or kV according to patient size and/or use of iterative reconstruction technique. COMPARISON:  07/16/2023. FINDINGS: CT HEAD FINDINGS Brain: Focus of isoattenuating to slightly hyperattenuating fluid overlying the right frontal convexity measuring up to 3.6 mm in maximum thickness. No significant mass effect. No midline shift. No evidence of parenchymal hemorrhage. Nonspecific hypoattenuation in the periventricular and subcortical white matter favored to reflect chronic microvascular ischemic changes. No CT evidence of  acute infarct. The basilar cisterns are patent. Ventricles: Prominence of the ventricles suggesting underlying parenchymal volume loss. Vascular: Atherosclerotic calcifications of the carotid siphons and intracranial vertebral arteries. No hyperdense vessel. Skull: No acute or aggressive finding. Orbits: Orbits are symmetric. Sinuses: The visualized paranasal sinuses are clear. Other: Mastoid air cells are clear. Traumatic Brain Injury Risk Stratification Skull Fracture: No - Low/mBIG 1 Subdural Hematoma (SDH): <75mm - mBIG 1 Subarachnoid Hemorrhage [facility]): No Epidural Hematoma (EDH): No - Low/mBIG 1 Cerebral contusion, intra-axial, intraparenchymal Hemorrhage (IPH): No Intraventricular Hemorrhage (IVH): No - Low/mBIG 1 Midline Shift > 1mm or Edema/effacement of sulci/vents: No - Low/mBIG 1 ---------------------------------------------------- CT CERVICAL SPINE FINDINGS Alignment: Cervical lordosis is maintained. Trace anterolisthesis of C3 on C4 and C4 on C5. Additional 2 mm anterolisthesis of C5 on C6. No facet subluxation or dislocation. Skull base and vertebrae: No compression fracture or displaced fracture in the cervical spine. No suspicious osseous lesion. Soft tissues and spinal canal: No prevertebral fluid or swelling. No visible canal  hematoma. Disc levels: Intervertebral disc space narrowing at multiple levels. No high-grade osseous spinal canal stenosis. Facet arthrosis at multiple levels. Foraminal narrowing is most pronounced at C4-5. Upper chest: Negative. Other: None. IMPRESSION: Small focus of subdural hemorrhage over the right frontal convexity measuring up to 3.6 mm in maximum thickness. No significant mass effect. No midline shift. No acute fracture or traumatic malalignment of the cervical spine. Similar chronic and degenerative changes as above. Electronically Signed: By: Denny Flack M.D. On: 09/30/2023 16:24   CT Cervical Spine Wo Contrast Addendum Date: 09/30/2023 ADDENDUM REPORT: 09/30/2023 16:43 ADDENDUM: These results were called by telephone at the time of interpretation on 09/30/2023 at 4:28 pm to provider Dr. Vicenta Graft, Who verbally acknowledged these results. Electronically Signed   By: Denny Flack M.D.   On: 09/30/2023 16:43   Result Date: 09/30/2023 CLINICAL DATA:  Trauma, fall with possible head strike. EXAM: CT HEAD WITHOUT CONTRAST CT CERVICAL SPINE WITHOUT CONTRAST TECHNIQUE: Multidetector CT imaging of the head and cervical spine was performed following the standard protocol without intravenous contrast. Multiplanar CT image reconstructions of the cervical spine were also generated. RADIATION DOSE REDUCTION: This exam was performed according to the departmental dose-optimization program which includes automated exposure control, adjustment of the mA and/or kV according to patient size and/or use of iterative reconstruction technique. COMPARISON:  07/16/2023. FINDINGS: CT HEAD FINDINGS Brain: Focus of isoattenuating to slightly hyperattenuating fluid overlying the right frontal convexity measuring up to 3.6 mm in maximum thickness. No significant mass effect. No midline shift. No evidence of parenchymal hemorrhage. Nonspecific hypoattenuation in the periventricular and subcortical white matter favored to reflect  chronic microvascular ischemic changes. No CT evidence of acute infarct. The basilar cisterns are patent. Ventricles: Prominence of the ventricles suggesting underlying parenchymal volume loss. Vascular: Atherosclerotic calcifications of the carotid siphons and intracranial vertebral arteries. No hyperdense vessel. Skull: No acute or aggressive finding. Orbits: Orbits are symmetric. Sinuses: The visualized paranasal sinuses are clear. Other: Mastoid air cells are clear. Traumatic Brain Injury Risk Stratification Skull Fracture: No - Low/mBIG 1 Subdural Hematoma (SDH): <10mm - mBIG 1 Subarachnoid Hemorrhage [facility]): No Epidural Hematoma (EDH): No - Low/mBIG 1 Cerebral contusion, intra-axial, intraparenchymal Hemorrhage (IPH): No Intraventricular Hemorrhage (IVH): No - Low/mBIG 1 Midline Shift > 1mm or Edema/effacement of sulci/vents: No - Low/mBIG 1 ---------------------------------------------------- CT CERVICAL SPINE FINDINGS Alignment: Cervical lordosis is maintained. Trace anterolisthesis of C3 on C4 and C4 on C5. Additional 2 mm anterolisthesis of C5 on C6.  No facet subluxation or dislocation. Skull base and vertebrae: No compression fracture or displaced fracture in the cervical spine. No suspicious osseous lesion. Soft tissues and spinal canal: No prevertebral fluid or swelling. No visible canal hematoma. Disc levels: Intervertebral disc space narrowing at multiple levels. No high-grade osseous spinal canal stenosis. Facet arthrosis at multiple levels. Foraminal narrowing is most pronounced at C4-5. Upper chest: Negative. Other: None. IMPRESSION: Small focus of subdural hemorrhage over the right frontal convexity measuring up to 3.6 mm in maximum thickness. No significant mass effect. No midline shift. No acute fracture or traumatic malalignment of the cervical spine. Similar chronic and degenerative changes as above. Electronically Signed: By: Denny Flack M.D. On: 09/30/2023 16:24   DG Hip Unilat W or Wo  Pelvis 2-3 Views Right Result Date: 09/30/2023 CLINICAL DATA:  Pain after fall EXAM: DG HIP (WITH OR WITHOUT PELVIS) 2-3V RIGHT COMPARISON:  None Available. FINDINGS: Osteopenia. Preserved joint spaces. Mild hypertrophic changes along the pubic symphysis. There is subtle irregularity along the cortex and along the trabecula of the subcapital right femoral neck. There is also cortical irregularity with step-off laterally about the femoral head/neck. Worrisome for nondisplaced fracture. Elsewhere there presumed vascular calcifications in the pelvis. IMPRESSION: Severe osteopenia. Irregularity along the subcapital right femoral neck with a step-off laterally on the frontal view of the hip, worrisome for nondisplaced fracture. Electronically Signed   By: Adrianna Horde M.D.   On: 09/30/2023 16:20   DG Knee Complete 4 Views Right Result Date: 09/30/2023 CLINICAL DATA:  Pain after fall EXAM: RIGHT KNEE - COMPLETE 2 VIEW COMPARISON:  None Available. FINDINGS: Osteopenia. No fracture or dislocation. Preserved joint spaces. Minimal osteophytes of the medial compartment. No joint effusion on lateral view. Vascular calcifications seen posterior to the knee. IMPRESSION: Osteopenia.  Slight degenerative changes. Electronically Signed   By: Adrianna Horde M.D.   On: 09/30/2023 16:09         Felipa Laroche, DO [facility] 10/03/2023, 2:44 PM    Dictation software may have been used to generate the above note. Typos may occur and escape review in typed/dictated notes. Please contact Dr Authur Leghorn directly for clarity if needed.  Staff may message me via secure chat in Epic  but this may not receive an immediate response,  please page me for urgent matters!  If 7PM-7AM, please contact night coverage [email or web address]

## 2023-10-03 NOTE — Progress Notes (Signed)
 Subjective: 2 Days Post-Op Procedure(s) (LRB): RIGHT HIP PERCUTANEOUS PINNING (Right) Patient reports pain as mild.   Patient seen in rounds with Dr. Daun Epstein. Patient is well, and has had no acute complaints or problems. Denies any CP, SOB, N/V, fevers or chills We will continue therapy today.  Plan is to go Skilled nursing facility after hospital stay.  Objective: Vital signs in last 24 hours: Temp:  [97.8 F (36.6 C)-98 F (36.7 C)] 97.8 F (36.6 C) (04/20 0808) Pulse Rate:  [73-93] 73 (04/20 0808) Resp:  [16-18] 16 (04/20 0808) BP: (139-152)/(48-71) 139/53 (04/20 0808) SpO2:  [93 %-97 %] 96 % (04/20 0808)  Intake/Output from previous day:  Intake/Output Summary (Last 24 hours) at 10/03/2023 0906 Last data filed at 10/03/2023 1610 Gross per 24 hour  Intake 575.63 ml  Output 700 ml  Net -124.37 ml    Intake/Output this shift: No intake/output data recorded.  Labs: Recent Labs    09/30/23 2318 10/01/23 0520 10/02/23 0511 10/03/23 0457  HGB 13.1 13.5 13.3 13.1   Recent Labs    10/02/23 0511 10/03/23 0457  WBC 12.1* 20.1*  RBC 4.16 4.16  HCT 38.5 37.6  PLT 220 269   Recent Labs    10/02/23 0511 10/03/23 0457  NA 131* 134*  K 5.0 4.4  CL 101 100  CO2 21* 23  BUN 22 34*  CREATININE 1.04* 0.97  GLUCOSE 141* 124*  CALCIUM  8.7* 9.0   Recent Labs    09/30/23 2318  INR 1.1    EXAM General - Patient is Alert and pleasantly confused, able to answer some questions and engage movements with prompting Extremity - Neurologically intact Neurovascular intact Sensation intact distally Intact pulses distally Dorsiflexion/Plantar flexion intact No cellulitis present Compartment soft Dressing - dressing C/D/I and no drainage Motor Function - intact, moving foot and toes well on exam.   Past Medical History:  Diagnosis Date   Arthritis    COPD (chronic obstructive pulmonary disease) (HCC)    Fibromyalgia    Hypertension    Macular degeneration      Assessment/Plan: 2 Days Post-Op Procedure(s) (LRB): RIGHT HIP PERCUTANEOUS PINNING (Right) Principal Problem:   Closed right hip fracture (HCC) Active Problems:   Essential hypertension   CAD (coronary artery disease)   AF (paroxysmal atrial fibrillation) (HCC)   SDH (subdural hematoma) (HCC)   Fall at home, initial encounter   Chronic diastolic CHF (congestive heart failure) (HCC)   Chronic kidney disease, stage 3a (HCC)   Hip fracture (HCC)  Estimated body mass index is 24.76 kg/m as calculated from the following:   Height as of this encounter: 5\' 7"  (1.702 m).   Weight as of this encounter: 71.7 kg. Advance diet Up with therapy Patient will continue to work with physical therapy Discussed with the patient continuing to utilize ice over the bandage Honeycomb bandage intact without drainage VSS Labs show hemoglobin of 13.1 Plan to DC to SNF vs back to assisted living facility  Weight-Bearing as tolerated to right leg DVT prophylaxis: Lovenox  and SCDs Patient will follow-up with [facility] clinic orthopedics in 2 weeks for re-imaging and reevaluation  Wadie Guile, PA-C [facility] 10/03/2023, 9:06 AM

## 2023-10-03 NOTE — TOC Progression Note (Addendum)
 Transition of Care [facility]) - Progression Note    Patient Details  Name: Eileen Walsh MRN: [id number] Date of Birth: September 16, 1928  Transition of Care [facility] CM/SW Contact  [name], RN Phone Number: 10/03/2023, 1:12 PM  Clinical Narrative: 4/20: SNF rec by PT. [facility] RNCM spoke with daughter as patient only oriented to person. Patient was at [facility] last 09/23/23 and was at ALF for only a week before falling with RIGHT hip fx. and a small stable SDH on CT per provider progress notes Family wants STR at [facility] again. Will return to ALF upon completion of STR.    CSW referral sent via HUB to [facility].     Katheryn Pandy MSN RN CM  RN Case Manager   Transitions of Care Direct Dial: [phone] (Weekends Only) [facility] Main Office Phone: [phone] [facility] Fax: [phone] [email or web address]          Expected Discharge Plan and Services                                               Social Determinants of Health (SDOH) Interventions SDOH Screenings   Food Insecurity: No Food Insecurity (08/31/2023)   Received from [facility]  Housing: Patient Declined (10/28/2022)   Received from [facility], [facility]  Transportation Needs: No Transportation Needs (08/31/2023)   [facility]  Utilities: Patient Declined (10/28/2022)   [facility] from [facility], [facility]  Alcohol Screen: Low Risk  (07/15/2021)  Depression (PHQ2-9): Low Risk  (07/15/2021)  Financial Resource Strain: Low Risk  (08/31/2023)   Received from [facility]  Physical Activity: Inactive (10/09/2019)  Social Connections: Moderately Isolated (10/09/2019)  Stress: No Stress Concern Present (10/09/2019)  Tobacco Use: Low Risk  (10/01/2023)    Readmission Risk Interventions     No data to display

## 2023-10-03 NOTE — Progress Notes (Signed)
 Physical Therapy Treatment Patient Details Name: Eileen Walsh MRN: [id number] DOB: 08-06-1928 Today's Date: 10/03/2023   History of Present Illness Pt is a 89 y.o. female s/p fall trying to get to the chair (c/o R hip and knee pain).  Imaging showing: Small focus of subdural hemorrhage over the right frontal convexity measuring up to 3.6 mm in maximum thickness. No significant mass effect. No midline shift.; Subcapital fracture of the right femoral head and neck junction; Angulated comminuted fracture of the distal diaphysis and neck of  the fifth metatarsal.  Pt admitted with closed R hip fx, leukocytosis, SDH (stable on repeated CT scan), and R 5th toe fx.  S/p percutaneous pinning of R femoral neck fx 10/01/23.  PMH includes htn, a-fib with RVR, CAD, htn, SDH, fibromyalgia, neuropathy, recent R 5th toe fx, breast sx.    PT Comments  Pt ready for session.  Participated in exercises as described below.  With time and cues she is able to transition to sitting EOB with mod a x 1.  She does have right/post lean today and needs some inc assist for sitting balance.  Stood x 2 with RW with right/post lean continued.  She is able to weight shift and lift RLE but has trouble lifting LLE to step or shuffle steps today.  After sitting, walker removed and stnd pivot transfer to recliner with mod/max a x 1 as pt "flops" during transfer into chair.  Positioned for comfort.  Discussed transfer with tech and RN.  Encouraged +2 for safety with nursing staff.     If plan is discharge home, recommend the following: Two people to help with walking and/or transfers;Assistance with cooking/housework;Assist for transportation;Help with stairs or ramp for entrance;Two people to help with bathing/dressing/bathroom   Can travel by private vehicle        Equipment Recommendations       Recommendations for Other Services       Precautions / Restrictions Precautions Precautions: Fall Recall of  Precautions/Restrictions: Impaired Restrictions Weight Bearing Restrictions Per Provider Order: Yes RLE Weight Bearing Per Provider Order: Weight bearing as tolerated     Mobility  Bed Mobility Overal bed mobility: Needs Assistance Bed Mobility: Supine to Sit     Supine to sit: Mod assist     General bed mobility comments: increased time and cues but she is able to assist today    Transfers Overall transfer level: Needs assistance Equipment used: Rolling walker (2 wheels), 1 person hand held assist Transfers: Sit to/from Stand, Bed to chair/wheelchair/BSC Sit to Stand: Mod assist     Squat pivot transfers: Mod assist, Max assist     General transfer comment: stood to walker x 2 for pregait activities but unable to take steps with walker.  stnad pivot to chair without walker    Ambulation/Gait               General Gait Details: Counsellor     Tilt Bed    Modified Rankin (Stroke Patients Only)       Balance Overall balance assessment: Needs assistance Sitting-balance support: Bilateral upper extremity supported, Feet supported Sitting balance-Leahy Scale: Poor Sitting balance - Comments: right lean promounced today Postural control: Posterior lean, Right lateral lean Standing balance support: Bilateral upper extremity supported, Reliant on assistive device for balance Standing balance-Leahy Scale: Poor Standing balance comment: post right lean  Communication Communication Communication: Impaired Factors Affecting Communication: Hearing impaired  Cognition Arousal: Alert Behavior During Therapy: WFL for tasks assessed/performed   PT - Cognitive impairments: History of cognitive impairments                         Following commands: Impaired Following commands impaired: Follows one step commands with increased time, Follows one step commands  inconsistently    Cueing Cueing Techniques: Verbal cues, Tactile cues  Exercises Other Exercises Other Exercises: supine aAROM prior to mobility    General Comments        Pertinent Vitals/Pain Pain Assessment Pain Assessment: Faces Faces Pain Scale: Hurts little more Pain Location: R hip/thigh Pain Descriptors / Indicators: Sharp, Discomfort, Guarding Pain Intervention(s): Monitored during session, Repositioned, Patient requesting pain meds-RN notified    Home Living                          Prior Function            PT Goals (current goals can now be found in the care plan section) Progress towards PT goals: Progressing toward goals    Frequency    7X/week      PT Plan      Co-evaluation              AM-PAC PT "6 Clicks" Mobility   Outcome Measure  Help needed turning from your back to your side while in a flat bed without using bedrails?: A Little Help needed moving from lying on your back to sitting on the side of a flat bed without using bedrails?: A Lot Help needed moving to and from a bed to a chair (including a wheelchair)?: A Lot Help needed standing up from a chair using your arms (e.g., wheelchair or bedside chair)?: A Lot Help needed to walk in hospital room?: Total Help needed climbing 3-5 steps with a railing? : Total 6 Click Score: 11    End of Session Equipment Utilized During Treatment: Gait belt Activity Tolerance: Patient tolerated treatment well Patient left: in chair;with call bell/phone within reach;with chair alarm set Nurse Communication: Mobility status;Patient requests pain meds PT Visit Diagnosis: Other abnormalities of gait and mobility (R26.89);Unsteadiness on feet (R26.81);Muscle weakness (generalized) (M62.81);History of falling (Z91.81);Pain Pain - Right/Left: Right Pain - part of body: Hip     Time: 1610-9604 PT Time Calculation (min) (ACUTE ONLY): 19 min  Charges:    $Therapeutic Activity: 8-22  mins PT General Charges $$ ACUTE PT VISIT: 1 Visit                   Charlanne Cong, PTA 10/03/23, 10:31 AM

## 2023-10-03 NOTE — NC FL2 (Signed)
 [place]  MEDICAID FL2 LEVEL OF CARE FORM     IDENTIFICATION  Patient Name: Eileen Walsh Birthdate: 07/23/1928 Sex: female Admission Date (Current Location): 09/30/2023  [facility] [place] Number:  Chiropodist and Address:  [facility], [address] [place], [place] [address]      Provider Number: [id number]  Attending Physician Name and Address:  Melodi Sprung, DO  Relative Name and Phone Number:  [name] (Daughter)  [phone] (Mobile)    Current Level of Care: Hospital Recommended Level of Care: Skilled Nursing Facility Prior Approval Number:    Date Approved/Denied:   PASRR Number: [id number]  Discharge Plan: Other (Comment) ([facility])    Current Diagnoses: Patient Active Problem List   Diagnosis Date Noted   Hip fracture (HCC) 10/01/2023   Closed right hip fracture (HCC) 09/30/2023   Fall at home, initial encounter 09/30/2023   Chronic diastolic CHF (congestive heart failure) (HCC) 09/30/2023   Chronic kidney disease, stage 3a (HCC) 09/30/2023   UTI (urinary tract infection) 12/07/2021   Subdural hematoma (HCC) 11/27/2021   SDH (subdural hematoma) (HCC) 11/26/2021   Hypertensive urgency 11/26/2021   Syncope 11/26/2021   Generalized weakness 07/02/2021   Vertigo 07/02/2021   AF (paroxysmal atrial fibrillation) (HCC) 07/02/2021   Essential hypertension 01/12/2019   Choledocholithiasis 01/12/2019   Atrial fibrillation with RVR (HCC) 07/16/2016   Arthritis 11/05/2014   Back ache 11/05/2014   Clinical depression 11/05/2014   Diabetes (HCC) 11/05/2014   Fibrositis 11/05/2014   Acid reflux 11/05/2014   Blood glucose elevated 11/05/2014   HLD (hyperlipidemia) 11/05/2014   BP (high blood pressure) 11/05/2014   Neuropathy 11/05/2014   Adiposity 11/05/2014   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   Arteriosclerosis of coronary artery 04/06/2014   Essential (primary) hypertension 04/06/2014    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/2014   CAD (coronary artery disease) 04/06/2014    Orientation RESPIRATION BLADDER Height & Weight     Self (Memory Impairment)  Normal External catheter Weight: 71.7 kg Height:  5\' 7"  (170.2 cm)  BEHAVIORAL SYMPTOMS/MOOD NEUROLOGICAL BOWEL NUTRITION STATUS      Continent Diet  AMBULATORY STATUS COMMUNICATION OF NEEDS Skin   Extensive Assist Verbally Other (Comment), Surgical wounds (redness on buttocks)                       Personal Care Assistance Level of Assistance  Bathing, Dressing Bathing Assistance: Maximum assistance   Dressing Assistance: Maximum assistance     Functional Limitations Info             SPECIAL CARE FACTORS FREQUENCY  PT (By licensed PT), OT (By licensed OT)     PT Frequency: 5/week OT Frequency: 5x/week            Contractures Contractures Info: Not present    Additional Factors Info  Code Status, Allergies Code Status Info: Full Code Allergies Info: Aspirin , Sulfa Antibiotics           Current Medications (10/03/2023):  This is the current hospital active medication list Current Facility-Administered Medications  Medication Dose Route Frequency Provider Last Rate Last Admin   acetaminophen  (TYLENOL ) tablet 1,000 mg  1,000 mg Oral Q8H Lorri Rota, MD   1,000 mg at 10/03/23 8469   amLODipine  (NORVASC ) tablet 10 mg  10 mg Oral Daily Lorri Rota, MD   10 mg at 10/03/23 0955   And   irbesartan  (AVAPRO ) tablet 300 mg  300 mg  Oral Daily Lorri Rota, MD   300 mg at 10/03/23 1610   bisacodyl  (DULCOLAX) suppository 10 mg  10 mg Rectal Daily PRN Lorri Rota, MD       calcium  carbonate (TUMS - dosed in mg elemental calcium ) chewable tablet 400 mg of elemental calcium   2 tablet Oral Q4H PRN Lorri Rota, MD       clobetasol  cream (TEMOVATE ) 0.05 % 1 Application  1 Application Topical BID Lorri Rota, MD   1 Application at 10/03/23 1159   docusate sodium  (COLACE) capsule 100  mg  100 mg Oral BID Lorri Rota, MD   100 mg at 10/02/23 2137   DULoxetine  (CYMBALTA ) DR capsule 30 mg  30 mg Oral Daily Lorri Rota, MD   30 mg at 10/03/23 0955   enoxaparin  (LOVENOX ) injection 40 mg  40 mg Subcutaneous Q24H Lorri Rota, MD   40 mg at 10/03/23 0955   feeding supplement (ENSURE ENLIVE / ENSURE PLUS) liquid 237 mL  237 mL Oral BID BM Lorri Rota, MD   237 mL at 10/03/23 1159   hydrALAZINE  (APRESOLINE ) injection 5 mg  5 mg Intravenous Q2H PRN Lorri Rota, MD       HYDROmorphone  (DILAUDID ) injection 0.2-0.4 mg  0.2-0.4 mg Intravenous Q4H PRN Lorri Rota, MD       lidocaine  (LIDODERM ) 5 % 1 patch  1 patch Transdermal Q24H Lorri Rota, MD   1 patch at 10/02/23 2204   methocarbamol  (ROBAXIN ) tablet 500 mg  500 mg Oral Q6H PRN Lorri Rota, MD   500 mg at 10/03/23 9604   Or   methocarbamol  (ROBAXIN ) injection 500 mg  500 mg Intravenous Q6H PRN Lorri Rota, MD       metoCLOPramide  (REGLAN ) tablet 5-10 mg  5-10 mg Oral Q8H PRN Lorri Rota, MD       Or   metoCLOPramide  (REGLAN ) injection 5-10 mg  5-10 mg Intravenous Q8H PRN Lorri Rota, MD       multivitamin with minerals tablet 1 tablet  1 tablet Oral Daily Lorri Rota, MD   1 tablet at 10/03/23 5409   ondansetron  (ZOFRAN ) tablet 4 mg  4 mg Oral Q6H PRN Lorri Rota, MD       Or   ondansetron  (ZOFRAN ) injection 4 mg  4 mg Intravenous Q6H PRN Lorri Rota, MD       oxyCODONE  (Oxy IR/ROXICODONE ) immediate release tablet 2.5-5 mg  2.5-5 mg Oral Q4H PRN Lorri Rota, MD       oxyCODONE  (Oxy IR/ROXICODONE ) immediate release tablet 5-10 mg  5-10 mg Oral Q4H PRN Lorri Rota, MD   5 mg at 10/03/23 1203   senna-docusate (Senokot-S) tablet 1 tablet  1 tablet Oral QHS PRN Lorri Rota, MD   1 tablet at 10/02/23 2137   sodium phosphate  (FLEET) enema 1 enema  1 enema Rectal Once PRN Lorri Rota, MD       traMADol  (ULTRAM ) tablet 50 mg  50 mg Oral Q6H PRN Lorri Rota, MD         Discharge Medications: Please see discharge summary  for a list of discharge medications.  Relevant Imaging Results:  Relevant Lab Results:   Additional Information SSN:[id number]  [name], RN

## 2023-10-04 ENCOUNTER — Telehealth: Payer: Self-pay | Admitting: Neurosurgery

## 2023-10-04 ENCOUNTER — Inpatient Hospital Stay

## 2023-10-04 ENCOUNTER — Encounter: Payer: Self-pay | Admitting: Orthopedic Surgery

## 2023-10-04 DIAGNOSIS — E440 Moderate protein-calorie malnutrition: Secondary | ICD-10-CM | POA: Insufficient documentation

## 2023-10-04 DIAGNOSIS — S72001A Fracture of unspecified part of neck of right femur, initial encounter for closed fracture: Secondary | ICD-10-CM | POA: Diagnosis not present

## 2023-10-04 DIAGNOSIS — S065XAA Traumatic subdural hemorrhage with loss of consciousness status unknown, initial encounter: Secondary | ICD-10-CM

## 2023-10-04 LAB — CBC
HCT: 37.5 % (ref 36.0–46.0)
Hemoglobin: 12.6 g/dL (ref 12.0–15.0)
MCH: 31.5 pg (ref 26.0–34.0)
MCHC: 33.6 g/dL (ref 30.0–36.0)
MCV: 93.8 fL (ref 80.0–100.0)
Platelets: 271 10*3/uL (ref 150–400)
RBC: 4 MIL/uL (ref 3.87–5.11)
RDW: 12.4 % (ref 11.5–15.5)
WBC: 14.9 10*3/uL — ABNORMAL HIGH (ref 4.0–10.5)
nRBC: 0 % (ref 0.0–0.2)

## 2023-10-04 LAB — BASIC METABOLIC PANEL WITH GFR
Anion gap: 7 (ref 5–15)
BUN: 31 mg/dL — ABNORMAL HIGH (ref 8–23)
CO2: 25 mmol/L (ref 22–32)
Calcium: 8.7 mg/dL — ABNORMAL LOW (ref 8.9–10.3)
Chloride: 100 mmol/L (ref 98–111)
Creatinine, Ser: 0.9 mg/dL (ref 0.44–1.00)
GFR, Estimated: 60 mL/min — ABNORMAL LOW (ref >60)
Glucose, Bld: 105 mg/dL — ABNORMAL HIGH (ref 70–99)
Potassium: 4.8 mmol/L (ref 3.5–5.1)
Sodium: 132 mmol/L — ABNORMAL LOW (ref 135–145)

## 2023-10-04 MED ORDER — TRAMADOL HCL 50 MG PO TABS: 50.0000 mg | ORAL_TABLET | Freq: Four times a day (QID) | ORAL | 0 refills | Status: DC | PRN

## 2023-10-04 MED ORDER — OXYCODONE HCL 5 MG PO TABS: 2.5000 mg | ORAL_TABLET | ORAL | 0 refills | Status: DC | PRN

## 2023-10-04 MED ORDER — OXYCODONE HCL 5 MG PO TABS
5.0000 mg | ORAL_TABLET | ORAL | Status: AC
  Administered 2023-10-04 – 2023-10-05 (×3): 5 mg via ORAL
  Filled 2023-10-04 (×3): qty 1

## 2023-10-04 MED ORDER — MORPHINE SULFATE (PF) 2 MG/ML IV SOLN: 2.0000 mg | INTRAVENOUS | Status: DC | PRN

## 2023-10-04 MED ORDER — ENOXAPARIN SODIUM 40 MG/0.4ML IJ SOSY: 40.0000 mg | PREFILLED_SYRINGE | INTRAMUSCULAR | 0 refills | Status: DC

## 2023-10-04 NOTE — Progress Notes (Signed)
 Subjective: 3 Days Post-Op Procedure(s) (LRB): RIGHT HIP PERCUTANEOUS PINNING (Right) Patient reports pain as mild.   Patient is well, and has had no acute complaints or problems. Denies any CP, SOB, N/V, fevers or chills We will continue therapy today.  Plan is to go Skilled nursing facility after hospital stay.  Objective: Vital signs in last 24 hours: Temp:  [97.3 F (36.3 C)-98 F (36.7 C)] 97.3 F (36.3 C) (04/21 0428) Pulse Rate:  [73-85] 85 (04/21 0428) Resp:  [16-18] 18 (04/21 0428) BP: (99-139)/(53-58) 99/58 (04/21 0428) SpO2:  [93 %-96 %] 93 % (04/21 0428)  Intake/Output from previous day:  Intake/Output Summary (Last 24 hours) at 10/04/2023 0711 Last data filed at 10/04/2023 0330 Gross per 24 hour  Intake 950 ml  Output 200 ml  Net 750 ml    Intake/Output this shift: No intake/output data recorded.  Labs: Recent Labs    10/02/23 0511 10/03/23 0457 10/04/23 0357  HGB 13.3 13.1  13.1 12.6   Recent Labs    10/03/23 0457 10/04/23 0357  WBC 20.1*  20.1* 14.9*  RBC 4.10  4.16 4.00  HCT 38.8  37.6 37.5  PLT 266  269 271   Recent Labs    10/03/23 0457 10/04/23 0357  NA 134* 132*  K 4.4 4.8  CL 100 100  CO2 23 25  BUN 34* 31*  CREATININE 0.97 0.90  GLUCOSE 124* 105*  CALCIUM  9.0 8.7*   No results for input(s): "LABPT", "INR" in the last 72 hours.   EXAM General - Patient is Alert and pleasantly confused, able to answer some questions and engage movements with prompting Extremity - Neurologically intact Neurovascular intact Sensation intact distally Intact pulses distally Dorsiflexion/Plantar flexion intact No cellulitis present Compartment soft Dressing - dressing C/D/I and no drainage Motor Function - intact, moving foot and toes well on exam.   Past Medical History:  Diagnosis Date   Arthritis    COPD (chronic obstructive pulmonary disease) (HCC)    Fibromyalgia    Hypertension    Macular degeneration     Assessment/Plan: 3  Days Post-Op Procedure(s) (LRB): RIGHT HIP PERCUTANEOUS PINNING (Right) Principal Problem:   Closed right hip fracture (HCC) Active Problems:   Essential hypertension   CAD (coronary artery disease)   AF (paroxysmal atrial fibrillation) (HCC)   SDH (subdural hematoma) (HCC)   Fall at home, initial encounter   Chronic diastolic CHF (congestive heart failure) (HCC)   Chronic kidney disease, stage 3a (HCC)   Hip fracture (HCC)  Estimated body mass index is 24.76 kg/m as calculated from the following:   Height as of this encounter: 5\' 7"  (1.702 m).   Weight as of this encounter: 71.7 kg. Advance diet Up with therapy Patient will continue to work with physical therapy Discussed with the patient continuing to utilize ice over the bandage Honeycomb bandage intact without drainage VSS Labs show hemoglobin of 13.1 Plan to DC to SNF vs back to assisted living facility  Weight-Bearing as tolerated to right leg DVT prophylaxis: Lovenox  and SCDs Patient will follow-up with [facility] clinic orthopedics in 2 weeks for re-imaging and reevaluation  Bert Britain PA-C [facility] 10/04/2023, 7:11 AM

## 2023-10-04 NOTE — Telephone Encounter (Signed)
 Carroll Clamp, MD  P [facility] Admin Clinic: [facility] Timeline: 2-4wks Tests to order: repeat head CT, non con ED 09/30/2003 for subdural hematoma

## 2023-10-04 NOTE — Progress Notes (Signed)
 Patient is refusing mobility due to pain. Patient has been medicated towards the pain and still refuses mobility.  Will send message to provider.

## 2023-10-04 NOTE — Progress Notes (Signed)
 PROGRESS NOTE   Eileen Walsh   DGU:[id number] DOB: [birth year]  DOA: 09/30/2023 Date of Service: 10/04/23 which is hospital day 4  PCP: [facility], [facility] course / significant events:   HPI: Eileen Walsh is a 89 y.o. female with medical history significant of HTN, HLD, CAD, dCHf, GERD, Depression, fibromyalgia, SDH, neuropathy, recent right 5th toe fracture, who presentsto ED via [facility] from [facility] with fall and right hip and right knee pain. Also already has R 5th toe fracture, foot was in boot. Pt states that she lost her balance and fell when she was trying to sit in chair. She injured her right hip and right knee   04/17: X-ray of right knee is negative for acute fracture. X-ray of right hip showed showed possible fracture which is confirmed by CT scan. CT of head showed small subdural hematoma which is stable on repeat CT scan of head 6 hours later. Pt admitted for fall w/ hip fx, ortho to pin in AM.  04/18: repair hip fx  04/19-04/21: PT/OT recs for SNF rehab, placement pending     Consultants:  Orthopedic surgery  Neurosurgery   Procedures/Surgeries: 10/01/23 Percutaneous pinning of Right femoral neck fracture - Dr Basilio Both       ASSESSMENT & PLAN:   Closed right hip fracture  As evidenced by CT scan, pt has right subcapital femoral neck fracture.  Dr. Lydia Sams is planning for OR today Pain control  PT/OT when able to (not ordered now)   Leukocytosis - improving w/o intervention Likely due to stress-induced demargination.  Patient does not have signs/symptoms of infection and incision appears normal. NO other criteria for sepsis/SIRS Follow-up CBC,   Fall at residence, initial encounter History multiple falls  PT/OT when able, expect will need readmission to SNF/rehab or LTC   SDH (subdural hematoma)  pt has small SDH which is stable on repeated CT scan. Dr Felipe Horton, neurosurgery, has reviewed.  Pt can follow outpatient clinic in 2  to 4 weeks with a repeat head CT to evaluate for any expansion of her subdural hematoma given her age would avoid prophylactic antiepileptic's as these can sometimes exacerbate or cause worsening in neurologic symptoms.  IV hydralazine  as needed for SBP> 160   Essential hypertension IV hydralazine  as needed for SBP> 160 D/c amlodipine  d/t lower BP continue home irbesartan    Chronic diastolic CHF (congestive heart failure) HFpEF 2D echo on 12/08/2021 showed EF of 60 to 65% with grade 1 diastolic dysfunction. CHF is compensated. monitor  CAD (coronary artery disease):  no CP. Pt is not taking ASA or statin Monitor for chest pain    Chronic kidney disease, stage 3a  follow BMP   History of AF (paroxysmal atrial fibrillation)  Heart rate 80. Tele monitoring ok to dc    Right 5th toe fracture: pain control as above   Dementia Stable, pleasantly confused Precautions per protocol  Perioperative Cardiac Risk:  GUPTA score perioperative myocardial infarction or cardaic arrest is 2.84%. Optimized for surgery     No concerns based on BMI: Body mass index is 24.76 kg/m.  Underweight - under 18  overweight - 25 to 29 obese - 30 or more Class 1 obesity: BMI of 30.0 to 34 Class 2 obesity: BMI of 35.0 to 39 Class 3 obesity: BMI of 40.0 to 49 Super Morbid Obesity: BMI 50-59 Super-super Morbid Obesity: BMI 60+ Significantly low or high BMI is associated with higher medical risk.  Weight management  advised as adjunct to other disease management and risk reduction treatments    DVT prophylaxis: lovenox  IV fluids: no continuous IV fluids  Nutrition: NPO pending surgery Central lines / other devices: none  Code Status: FULL CODE ACP documentation reviewed:  none on file in [facility]  TOC needs: SNF rehab  Medical barriers to dispo: none, await placement          Subjective / Brief ROS:  Patient reports hip pain still bad today Denies CP/SOB, denies cough, denies  fever/chills, denies dysuria/frequency She is more upset in general today per RN, complaining of pain a lot more     Family Communication: none at this time     Objective Findings:  Vitals:   10/03/23 1750 10/03/23 2038 10/04/23 0428 10/04/23 0738  BP: (!) 104/54 (!) 124/53 (!) 99/58 108/81  Pulse: 81 78 85 78  Resp: 17 18 18 16   Temp: 98 F (36.7 C) 97.7 F (36.5 C) (!) 97.3 F (36.3 C) 97.9 F (36.6 C)  TempSrc:      SpO2: 96% 95% 93% 96%  Weight:      Height:        Intake/Output Summary (Last 24 hours) at 10/04/2023 1537 Last data filed at 10/04/2023 0330 Gross per 24 hour  Intake 714 ml  Output 200 ml  Net 514 ml   Filed Weights   09/30/23 1257  Weight: 71.7 kg    Examination:  Physical Exam Constitutional:      General: She is not in acute distress. Cardiovascular:     Rate and Rhythm: Normal rate and regular rhythm.  Pulmonary:     Effort: Pulmonary effort is normal.     Breath sounds: Normal breath sounds.  Neurological:     Mental Status: She is alert. Mental status is at baseline. She is disoriented.  Psychiatric:        Mood and Affect: Mood normal.        Behavior: Behavior normal.          Scheduled Medications:   acetaminophen   1,000 mg Oral Q8H   clobetasol  cream  1 Application Topical BID   docusate sodium   100 mg Oral BID   DULoxetine   30 mg Oral Daily   feeding supplement  237 mL Oral BID BM   irbesartan   300 mg Oral Daily   lidocaine   1 patch Transdermal Q24H   multivitamin with minerals  1 tablet Oral Daily    Continuous Infusions:    PRN Medications:  bisacodyl , calcium  carbonate, hydrALAZINE , HYDROmorphone  (DILAUDID ) injection, methocarbamol  **OR** methocarbamol  (ROBAXIN ) injection, metoCLOPramide  **OR** metoCLOPramide  (REGLAN ) injection, ondansetron  **OR** ondansetron  (ZOFRAN ) IV, oxyCODONE , oxyCODONE , senna-docusate, sodium phosphate , traMADol   Antimicrobials from admission:  Anti-infectives (From admission, onward)     Start     Dose/Rate Route Frequency Ordered Stop   10/01/23 2100  ceFAZolin  (ANCEF ) IVPB 2g/100 mL premix        2 g 200 mL/hr over 30 Minutes Intravenous Every 6 hours 10/01/23 1705 10/02/23 0951   10/01/23 1400  ceFAZolin  (ANCEF ) IVPB 2g/100 mL premix        2 g 200 mL/hr over 30 Minutes Intravenous  Once 09/30/23 2107 10/01/23 1520           Data Reviewed:  I have personally reviewed the following...  CBC: Recent Labs  Lab 09/30/23 2318 10/01/23 0520 10/02/23 0511 10/03/23 0457 10/04/23 0357  WBC 12.0* 10.2 12.1* 20.1*  20.1* 14.9*  NEUTROABS  --   --   --  17.8*  --   HGB 13.1 13.5 13.3 13.1  13.1 12.6  HCT 37.7 38.2 38.5 38.8  37.6 37.5  MCV 91.1 91.0 92.5 94.6  90.4 93.8  PLT 250 242 220 266  269 271   Basic Metabolic Panel: Recent Labs  Lab 09/30/23 2318 10/01/23 0520 10/02/23 0511 10/03/23 0457 10/04/23 0357  NA 133* 131* 131* 134* 132*  K 4.1 4.0 5.0 4.4 4.8  CL 103 101 101 100 100  CO2 23 23 21* 23 25  GLUCOSE 108* 102* 141* 124* 105*  BUN 16 14 22  34* 31*  CREATININE 0.78 0.82 1.04* 0.97 0.90  CALCIUM  8.8* 8.6* 8.7* 9.0 8.7*   GFR: Estimated Creatinine Clearance: 38.8 mL/min (by C-G formula based on SCr of 0.9 mg/dL). Liver Function Tests: No results for input(s): "AST", "ALT", "ALKPHOS", "BILITOT", "PROT", "ALBUMIN" in the last 168 hours. No results for input(s): "LIPASE", "AMYLASE" in the last 168 hours. No results for input(s): "AMMONIA" in the last 168 hours. Coagulation Profile: Recent Labs  Lab 09/30/23 2318  INR 1.1   Cardiac Enzymes: No results for input(s): "CKTOTAL", "CKMB", "CKMBINDEX", "TROPONINI" in the last 168 hours. BNP (last 3 results) No results for input(s): "PROBNP" in the last 8760 hours. HbA1C: No results for input(s): "HGBA1C" in the last 72 hours. CBG: No results for input(s): "GLUCAP" in the last 168 hours. Lipid Profile: No results for input(s): "CHOL", "HDL", "LDLCALC", "TRIG", "CHOLHDL",  "LDLDIRECT" in the last 72 hours. Thyroid  Function Tests: No results for input(s): "TSH", "T4TOTAL", "FREET4", "T3FREE", "THYROIDAB" in the last 72 hours. Anemia Panel: No results for input(s): "VITAMINB12", "FOLATE", "FERRITIN", "TIBC", "IRON", "RETICCTPCT" in the last 72 hours. Most Recent Urinalysis On File:     Component Value Date/Time   COLORURINE YELLOW (A) 07/16/2023 1950   APPEARANCEUR CLOUDY (A) 07/16/2023 1950   LABSPEC 1.015 07/16/2023 1950   PHURINE 5.0 07/16/2023 1950   GLUCOSEU NEGATIVE 07/16/2023 1950   HGBUR NEGATIVE 07/16/2023 1950   BILIRUBINUR NEGATIVE 07/16/2023 1950   KETONESUR NEGATIVE 07/16/2023 1950   PROTEINUR 30 (A) 07/16/2023 1950   NITRITE NEGATIVE 07/16/2023 1950   LEUKOCYTESUR MODERATE (A) 07/16/2023 1950   Sepsis Labs: @LABRCNTIP (procalcitonin:4,lacticidven:4) Microbiology: No results found for this or any previous visit (from the past 240 hours).    Radiology Studies last 3 days: DG FEMUR, MIN 2 VIEWS RIGHT Result Date: 10/01/2023 CLINICAL DATA:  Right hip pending EXAM: Intraoperative fluoroscopy COMPARISON:  X-ray and CT 09/30/2023 FINDINGS: Six fluoroscopic spot images submitted for review demonstrates placement of 3 longitudinal screws transfixing the subcapital femoral neck fracture. Imaging was obtained to aid in treatment. Please correlate with real-time fluoroscopy of 48 seconds. Cumulative dose 7.53 mGy. IMPRESSION: Intraoperative fluoroscopy Electronically Signed   By: Adrianna Horde M.D.   On: 10/01/2023 20:01   DG Pelvis Portable Result Date: 10/01/2023 CLINICAL DATA:  Right hip fracture. EXAM: PORTABLE PELVIS 1-2 VIEWS COMPARISON:  CT of the right hip dated 09/30/2023. FINDINGS: Subcapital fracture of the right femoral head and neck junction is again noted. The right femoral head is seated within the acetabulum. Sacroiliac joints and pubic symphysis are anatomically aligned. IMPRESSION: Subcapital fracture of the right femoral head and neck  junction. Electronically Signed   By: Mannie Seek M.D.   On: 10/01/2023 15:57   DG C-Arm 1-60 Min-No Report Result Date: 10/01/2023 Fluoroscopy was utilized by the requesting physician.  No radiographic interpretation.   DG Foot Complete Right Result Date: 09/30/2023 CLINICAL DATA:  Foot injury. EXAM: RIGHT FOOT  COMPLETE - 3+ VIEW COMPARISON:  None Available. FINDINGS: The bones are osteopenic. There is an angulated comminuted fracture of the distal diaphysis and neck of the fifth metatarsal. This is age indeterminate and may be acute. There is apex lateral angulation and 1 after chest with medial displacement of the distal fracture fragment. There is no dislocation. There is a healed distal fourth metatarsal fracture. There are moderate degenerative changes at the first metatarsophalangeal joint with Alex valgus. There is soft tissue swelling of the dorsum of the foot. IMPRESSION: 1. Angulated comminuted fracture of the distal diaphysis and neck of the fifth metatarsal. This is age indeterminate and may be acute. 2. Healed distal fourth metatarsal fracture. 3. Moderate degenerative changes at the first metatarsophalangeal joint with mild hallux valgus. Electronically Signed   By: Tyron Gallon M.D.   On: 09/30/2023 21:53   CT Head Wo Contrast Result Date: 09/30/2023 CLINICAL DATA:  Subdural hematoma. EXAM: CT HEAD WITHOUT CONTRAST TECHNIQUE: Contiguous axial images were obtained from the base of the skull through the vertex without intravenous contrast. RADIATION DOSE REDUCTION: This exam was performed according to the departmental dose-optimization program which includes automated exposure control, adjustment of the mA and/or kV according to patient size and/or use of iterative reconstruction technique. COMPARISON:  CT head from earlier today. FINDINGS: Brain: Unchanged small subdural hemorrhage along the right cerebral convexity without substantial mass effect. No midline shift. Similar patchy white  matter hypodensities, compatible with chronic microvascular disease. No evidence of acute large vascular territory infarct, mass lesion, or hydrocephalus. Vascular: Calcific atherosclerosis.  No hyperdense vessel. Skull: No acute fracture. Sinuses/Orbits: Clear sinuses.  No acute orbital findings. Other: No mastoid effusions. IMPRESSION: Unchanged small subdural hemorrhage along the right cerebral convexity without substantial mass effect. Electronically Signed   By: Stevenson Elbe M.D.   On: 09/30/2023 21:49   CT Hip Right Wo Contrast Result Date: 09/30/2023 CLINICAL DATA:  Fall, right hip pain. EXAM: CT OF THE RIGHT HIP WITHOUT CONTRAST TECHNIQUE: Multidetector CT imaging of the right hip was performed according to the standard protocol. Multiplanar CT image reconstructions were also generated. RADIATION DOSE REDUCTION: This exam was performed according to the departmental dose-optimization program which includes automated exposure control, adjustment of the mA and/or kV according to patient size and/or use of iterative reconstruction technique. COMPARISON:  Plain films today. FINDINGS: Bones/Joint/Cartilage There is a subcapital right femoral neck fracture with minimal displacement/angulation. No subluxation or dislocation. Ligaments Suboptimally assessed by CT. Muscles and Tendons Negative Soft tissues Negative IMPRESSION: Right subcapital femoral neck fracture. Electronically Signed   By: Janeece Mechanic M.D.   On: 09/30/2023 19:20         Othello Dickenson, DO [facility] 10/04/2023, 3:37 PM    Dictation software may have been used to generate the above note. Typos may occur and escape review in typed/dictated notes. Please contact Dr Authur Leghorn directly for clarity if needed.  Staff may message me via secure chat in Epic  but this may not receive an immediate response,  please page me for urgent matters!  If 7PM-7AM, please contact night coverage [email or web address]

## 2023-10-04 NOTE — Plan of Care (Signed)
   Problem: Nutrition: Goal: Adequate nutrition will be maintained Outcome: Progressing   Problem: Pain Managment: Goal: General experience of comfort will improve and/or be controlled Outcome: Progressing   Problem: Safety: Goal: Ability to remain free from injury will improve Outcome: Progressing

## 2023-10-04 NOTE — Telephone Encounter (Signed)
 Patient is still admitted.

## 2023-10-04 NOTE — Care Management Important Message (Signed)
 Important Message  Patient Details  Name: Eileen Walsh MRN: [id number] Date of Birth: 1929/05/27   Important Message Given:  Yes - Medicare IM     Cionna Collantes W, CMA 10/04/2023, 10:32 AM

## 2023-10-04 NOTE — TOC Progression Note (Signed)
 Transition of Care [facility]) - Progression Note    Patient Details  Name: Eileen Walsh MRN: [id number] Date of Birth: 03-31-1929  [facility] of [facility]) CM/SW Contact  Alexandra Ice, RN Phone Number: 10/04/2023, 4:10 PM  Clinical Narrative:     [employer] accepted patient, will initiate auth.       Expected Discharge Plan and Services                                               Social Determinants of Health (SDOH) Interventions SDOH Screenings   Food Insecurity: No Food Insecurity (08/31/2023)   Received from [facility]  Housing: Patient Declined (10/28/2022)   Received from [facility], [facility]  Transportation Needs: No Transportation Needs (08/31/2023)   Received from [facility]  Utilities: Patient Declined (10/28/2022)   Received from [facility], [facility]  Alcohol Screen: Low Risk  (07/15/2021)  Depression (PHQ2-9): Low Risk  (07/15/2021)  Financial Resource Strain: Low Risk  (08/31/2023)   Received from [facility]  Physical Activity: Inactive (10/09/2019)  Social Connections: Moderately Isolated (10/09/2019)  Stress: No Stress Concern Present (10/09/2019)  Tobacco Use: Low Risk  (10/01/2023)    Readmission Risk Interventions     No data to display

## 2023-10-04 NOTE — Progress Notes (Signed)
 Nutrition Follow-up  DOCUMENTATION CODES:   Non-severe (moderate) malnutrition in context of chronic illness  INTERVENTION:   -Continue dysphagia 3 diet -Continue Ensure Enlive po BID, each supplement provides 350 kcal and 20 grams of protein.  -Continue MVI with minerals daily -Magic cup TID with meals, each supplement provides 290 kcal and 9 grams of protein  -Feeding assistance with meals  NUTRITION DIAGNOSIS:   Moderate Malnutrition related to chronic illness (dementia) as evidenced by mild fat depletion, moderate fat depletion, mild muscle depletion, moderate muscle depletion.  Ongoing  GOAL:   Patient will meet greater than or equal to 90% of their needs  Progressing   MONITOR:   PO intake, Supplement acceptance, Diet advancement  REASON FOR ASSESSMENT:   Consult Assessment of nutrition requirement/status, Hip fracture protocol  ASSESSMENT:   Pt with medical history significant of HTN, HLD, CAD, dCHf, GERD, Depression, fibromyalgia, SDH, neuropathy, recent right 5th toe fracture, who presents with fall and right hip and right knee pain.  4/18- s/p Percutaneous pinning of Right femoral neck fracture   Reviewed I/O's: +750 ml x 24 hours and +1.3 L since admission  UOP: 200 ml x 24 hours  Pt sleeping soundly at time of visit, but arouse easily to voice. Noted lunch tray at bedside, untouched. Pt pleasantly confused and unable to provide history. No family at bedside. Pt reports poor appetite both here and at home. Per pt she "lives in a big house with all of those people- my mother and my brother".   Noted meal completions 0-25%. Pt on a dysphagia 3 diet.   No new wt since last visit. Wt has been stable over the past several year. However, noted last two weights are identical, so unsure if this is a measured wt vs stated weight. RD will obtain additional weight to better assess weight changes.   Medications reviewed and include colace.   Labs reviewed: Na: 132,  CBGS: 149 (inpatient orders for glycemic control are none).    Diet Order:   Diet Order             DIET DYS 3 Room service appropriate? Yes; Fluid consistency: Thin  Diet effective now                   EDUCATION NEEDS:   Not appropriate for education at this time  Skin:  Skin Assessment: Skin Integrity Issues: Skin Integrity Issues:: Incisions Incisions: closed rt hip  Last BM:  10/03/23 (type 6)  Height:   Ht Readings from Last 1 Encounters:  09/30/23 5\' 7"  (1.702 m)    Weight:   Wt Readings from Last 1 Encounters:  09/30/23 71.7 kg    Ideal Body Weight:  61.4 kg  BMI:  Body mass index is 24.76 kg/m.  Estimated Nutritional Needs:   Kcal:  1650-1850  Protein:  85-100 grams  Fluid:  1.6-1.8 L    Herschel Lords, RD, LDN, CDCES Registered Dietitian III Certified Diabetes Care and Education Specialist If unable to reach this RD, please use "RD Inpatient" group chat on secure chat between hours of 8am-4 pm daily

## 2023-10-04 NOTE — Progress Notes (Signed)
 Physical Therapy Treatment Patient Details Name: Eileen Walsh MRN: [id number] DOB: December 15, 1928 Today's Date: 10/04/2023   History of Present Illness Pt is a 89 y.o. female s/p fall trying to get to the chair (c/o R hip and knee pain).  Imaging showing: Small focus of subdural hemorrhage over the right frontal convexity measuring up to 3.6 mm in maximum thickness. No significant mass effect. No midline shift.; Subcapital fracture of the right femoral head and neck junction; Angulated comminuted fracture of the distal diaphysis and neck of  the fifth metatarsal.  Pt admitted with closed R hip fx, leukocytosis, SDH (stable on repeated CT scan), and R 5th toe fx.  S/p percutaneous pinning of R femoral neck fx 10/01/23.  PMH includes htn, a-fib with RVR, CAD, htn, SDH, fibromyalgia, neuropathy, recent R 5th toe fx, breast sx.    PT Comments  Nursing asking for assist to get pt back to bed. Stated she was up at 3:30 this am with staff and not generally uncomfortable and agitated.  Stedy lift to return to bed with +2 assist for standing and general safety given R lean.  Pt does have RLE flexed and externally rotated in chair (pinning not replaced) and does allow for painful return to neutral.  Pt with increased pain and agitation today.  Will reach out to MD.     If plan is discharge home, recommend the following: Two people to help with walking and/or transfers;Assistance with cooking/housework;Assist for transportation;Help with stairs or ramp for entrance;Two people to help with bathing/dressing/bathroom   Can travel by private vehicle        Equipment Recommendations       Recommendations for Other Services       Precautions / Restrictions Precautions Precautions: Fall Recall of Precautions/Restrictions: Impaired Restrictions Weight Bearing Restrictions Per Provider Order: Yes RLE Weight Bearing Per Provider Order: Weight bearing as tolerated     Mobility  Bed Mobility Overal bed  mobility: Needs Assistance Bed Mobility: Sit to Supine     Supine to sit: Max assist, Total assist, +2 for safety/equipment       Patient Response: Anxious  Transfers Overall transfer level: Needs assistance   Transfers: Bed to chair/wheelchair/BSC Sit to Stand: Via lift equipment, +2 physical assistance, Max assist           General transfer comment: stedy necessary for transfer this am Transfer via Lift Equipment: Stedy  Ambulation/Gait               General Gait Details: Counsellor     Tilt Bed Tilt Bed Patient Response: Anxious  Modified Rankin (Stroke Patients Only)       Balance Overall balance assessment: Needs assistance Sitting-balance support: Bilateral upper extremity supported, Feet supported Sitting balance-Leahy Scale: Poor       Standing balance-Leahy Scale: Poor                              Communication Communication Factors Affecting Communication: Hearing impaired  Cognition Arousal: Alert Behavior During Therapy: Agitated   PT - Cognitive impairments: History of cognitive impairments                       PT - Cognition Comments: generally axious this am due to pain Following commands: Intact Following commands impaired: Follows one step commands with  increased time, Follows one step commands inconsistently    Cueing Cueing Techniques: Verbal cues, Tactile cues  Exercises      General Comments        Pertinent Vitals/Pain Pain Assessment Pain Assessment: Faces Faces Pain Scale: Hurts worst Pain Descriptors / Indicators: Sharp, Discomfort, Guarding Pain Intervention(s): Limited activity within patient's tolerance, Monitored during session, RN gave pain meds during session, Repositioned    Home Living                          Prior Function            PT Goals (current goals can now be found in the care plan section) Progress towards  PT goals: Not progressing toward goals - comment    Frequency    7X/week      PT Plan      Co-evaluation              AM-PAC PT "6 Clicks" Mobility   Outcome Measure  Help needed turning from your back to your side while in a flat bed without using bedrails?: Total Help needed moving from lying on your back to sitting on the side of a flat bed without using bedrails?: Total Help needed moving to and from a bed to a chair (including a wheelchair)?: Total Help needed standing up from a chair using your arms (e.g., wheelchair or bedside chair)?: Total Help needed to walk in hospital room?: Total Help needed climbing 3-5 steps with a railing? : Total 6 Click Score: 6    End of Session Equipment Utilized During Treatment: Gait belt Activity Tolerance: Patient limited by pain Patient left: in bed;with bed alarm set;with call bell/phone within reach;with nursing/sitter in room Nurse Communication: Mobility status;Patient requests pain meds PT Visit Diagnosis: Other abnormalities of gait and mobility (R26.89);Unsteadiness on feet (R26.81);Muscle weakness (generalized) (M62.81);History of falling (Z91.81);Pain Pain - Right/Left: Right Pain - part of body: Hip     Time: 1610-9604 PT Time Calculation (min) (ACUTE ONLY): 13 min  Charges:    $Therapeutic Activity: 8-22 mins PT General Charges $$ ACUTE PT VISIT: 1 Visit                   Charlanne Cong, PTA 10/04/23, 10:58 AM

## 2023-10-04 NOTE — Discharge Instructions (Signed)

## 2023-10-05 DIAGNOSIS — S72001A Fracture of unspecified part of neck of right femur, initial encounter for closed fracture: Secondary | ICD-10-CM | POA: Diagnosis not present

## 2023-10-05 LAB — BASIC METABOLIC PANEL WITH GFR
Anion gap: 9 (ref 5–15)
BUN: 32 mg/dL — ABNORMAL HIGH (ref 8–23)
CO2: 24 mmol/L (ref 22–32)
Calcium: 8.6 mg/dL — ABNORMAL LOW (ref 8.9–10.3)
Chloride: 99 mmol/L (ref 98–111)
Creatinine, Ser: 0.83 mg/dL (ref 0.44–1.00)
GFR, Estimated: >60 mL/min (ref >60)
Glucose, Bld: 106 mg/dL — ABNORMAL HIGH (ref 70–99)
Potassium: 4.6 mmol/L (ref 3.5–5.1)
Sodium: 132 mmol/L — ABNORMAL LOW (ref 135–145)

## 2023-10-05 LAB — CBC
HCT: 36.1 % (ref 36.0–46.0)
Hemoglobin: 12.4 g/dL (ref 12.0–15.0)
MCH: 31.8 pg (ref 26.0–34.0)
MCHC: 34.3 g/dL (ref 30.0–36.0)
MCV: 92.6 fL (ref 80.0–100.0)
Platelets: 275 10*3/uL (ref 150–400)
RBC: 3.9 MIL/uL (ref 3.87–5.11)
RDW: 12.4 % (ref 11.5–15.5)
WBC: 11.8 10*3/uL — ABNORMAL HIGH (ref 4.0–10.5)
nRBC: 0 % (ref 0.0–0.2)

## 2023-10-05 MED ORDER — IRBESARTAN 150 MG PO TABS
75.0000 mg | ORAL_TABLET | Freq: Every day | ORAL | Status: DC
  Administered 2023-10-06: 75 mg via ORAL
  Filled 2023-10-05: qty 1

## 2023-10-05 MED ORDER — OXYCODONE HCL 5 MG PO TABS
5.0000 mg | ORAL_TABLET | ORAL | Status: DC | PRN
  Administered 2023-10-05 – 2023-10-06 (×3): 5 mg via ORAL
  Filled 2023-10-05 (×3): qty 1

## 2023-10-05 NOTE — Progress Notes (Signed)
 Physical Therapy Treatment Patient Details Name: Eileen Walsh MRN: [id number] DOB: [birth year] Today's Date: 10/05/2023   History of Present Illness Pt is a 89 y.o. female s/p fall trying to get to the chair (c/o R hip and knee pain).  Imaging showing: Small focus of subdural hemorrhage over the right frontal convexity measuring up to 3.6 mm in maximum thickness. No significant mass effect. No midline shift.; Subcapital fracture of the right femoral head and neck junction; Angulated comminuted fracture of the distal diaphysis and neck of  the fifth metatarsal.  Pt admitted with closed R hip fx, leukocytosis, SDH (stable on repeated CT scan), and R 5th toe fx.  S/p percutaneous pinning of R femoral neck fx 10/01/23.  PMH includes htn, a-fib with RVR, CAD, htn, SDH, fibromyalgia, neuropathy, recent R 5th toe fx, breast sx.    PT Comments  Pt ready for session.  RN and tech in room completing care.  She remains with increased confusion today.  Co-tx with OT for improved outcomes.  Pt holding RLE flexed and externally rotated with R foot positioned at knee.  Painful to stretch back to neutral again today but is able to straighten leg back out.  She sits EOB x 15 minutes.  Is able to maintain sitting balance briefly but will quickly lean and "flop" to the right/post needing mod/max intervention to remain upright and prevent fall.  She is able to participate in OT activities in sitting and does seem to have good strength in R hand/arm for tasks.  She fatigued and is assisted back to supine with max a x 2.  Able to feed herself oatmeal and ice cream when given spoon in R hand.  Abduction splint is obtained and placed on pt to prevent prior positioning which in turn should help with pain and prevent contractures/tightness during her recovery.  Secure chat to PA.  She is comfortable in brace and does not resist it.    She does talk frequently of dying and her funeral during session. "I feel like I am dying"   At  one point grabs her chest to "fake" a heart attack saying "that's how you get attention"  but denies any chest pain.  Does not want anything bad about her written in her chart so it is not said at her funeral.  RN aware.   If plan is discharge home, recommend the following: Two people to help with walking and/or transfers;Assistance with cooking/housework;Assist for transportation;Help with stairs or ramp for entrance;Two people to help with bathing/dressing/bathroom   Can travel by private vehicle        Equipment Recommendations       Recommendations for Other Services       Precautions / Restrictions Precautions Precautions: Fall Recall of Precautions/Restrictions: Impaired Restrictions Weight Bearing Restrictions Per Provider Order: Yes RLE Weight Bearing Per Provider Order: Weight bearing as tolerated     Mobility  Bed Mobility Overal bed mobility: Needs Assistance Bed Mobility: Supine to Sit, Sit to Supine     Supine to sit: Max assist, Total assist, +2 for safety/equipment Sit to supine: Max assist, Total assist, +2 for safety/equipment     Patient Response: Restless, Cooperative  Transfers                   General transfer comment: deferred due to sitting balance    Ambulation/Gait                   Stairs  Wheelchair Mobility     Tilt Bed Tilt Bed Patient Response: Restless, Cooperative  Modified Rankin (Stroke Patients Only)       Balance Overall balance assessment: Needs assistance Sitting-balance support: Bilateral upper extremity supported, Feet supported Sitting balance-Leahy Scale: Poor Sitting balance - Comments: able to hold baalnce briefly at times but often quickly leans right/back needing assist to prevent falls Postural control: Posterior lean, Right lateral lean                                  Communication Communication Communication: Impaired Factors Affecting Communication:  Hearing impaired  Cognition Arousal: Alert Behavior During Therapy: Restless   PT - Cognitive impairments: History of cognitive impairments                         Following commands: Intact Following commands impaired: Follows one step commands with increased time, Follows one step commands inconsistently    Cueing Cueing Techniques: Verbal cues, Tactile cues  Exercises Other Exercises Other Exercises: sitting EOB 15 minutes    General Comments        Pertinent Vitals/Pain Pain Assessment Pain Assessment: Faces Faces Pain Scale: Hurts whole lot Pain Location: when stretching RLE back to neutral Pain Descriptors / Indicators: Sharp, Discomfort, Guarding Pain Intervention(s): Limited activity within patient's tolerance, Monitored during session, Repositioned    Home Living                          Prior Function            PT Goals (current goals can now be found in the care plan section) Progress towards PT goals: Not progressing toward goals - comment    Frequency    7X/week      PT Plan      Co-evaluation PT/OT/SLP Co-Evaluation/Treatment: Yes Reason for Co-Treatment: Complexity of the patient's impairments (multi-system involvement) PT goals addressed during session: Mobility/safety with mobility OT goals addressed during session: ADL's and self-care;Strengthening/ROM      AM-PAC PT "6 Clicks" Mobility   Outcome Measure  Help needed turning from your back to your side while in a flat bed without using bedrails?: Total Help needed moving from lying on your back to sitting on the side of a flat bed without using bedrails?: Total Help needed moving to and from a bed to a chair (including a wheelchair)?: Total Help needed standing up from a chair using your arms (e.g., wheelchair or bedside chair)?: Total Help needed to walk in hospital room?: Total Help needed climbing 3-5 steps with a railing? : Total 6 Click Score: 6    End of  Session   Activity Tolerance: Patient limited by fatigue;Patient limited by pain Patient left: in bed;with call bell/phone within reach (bed alarm not setting.  RN aware) Nurse Communication: Mobility status;Other (comment) PT Visit Diagnosis: Other abnormalities of gait and mobility (R26.89);Unsteadiness on feet (R26.81);Muscle weakness (generalized) (M62.81);History of falling (Z91.81);Pain Pain - Right/Left: Right Pain - part of body: Hip     Time: 0454-0981 PT Time Calculation (min) (ACUTE ONLY): 46 min  Charges:    $Therapeutic Activity: 23-37 mins PT General Charges $$ ACUTE PT VISIT: 1 Visit                   Charlanne Cong, PTA 10/05/23, 10:11 AM

## 2023-10-05 NOTE — TOC Progression Note (Signed)
 Transition of Care [facility]) - Progression Note    Patient Details  Name: Eileen Walsh MRN: [id number] Date of Birth: 16-Jul-1928  [facility] CM/SW Contact  Alexandra Ice, RN Phone Number: 10/05/2023, 3:42 PM  Clinical Narrative:     Patient received insurance approval, [id number]. Notified Dr. Authur Leghorn. Pennie Box at [employer]  notifying her that patient was approved, see if she will have bed tomorrow.        Expected Discharge Plan and Services                                               Social Determinants of Health (SDOH) Interventions SDOH Screenings   Food Insecurity: No Food Insecurity (08/31/2023)   Received from [facility]  Housing: Patient Declined (10/28/2022)   Received from [facility], [facility]  Transportation Needs: No Transportation Needs (08/31/2023)   Received from [facility]  Utilities: Patient Declined (10/28/2022)   Received from [facility], [facility]  Alcohol Screen: Low Risk  (07/15/2021)  Depression (PHQ2-9): Low Risk  (07/15/2021)  Financial Resource Strain: Low Risk  (08/31/2023)   Received from [facility]  Physical Activity: Inactive (10/09/2019)  Social Connections: Moderately Isolated (10/09/2019)  Stress: No Stress Concern Present (10/09/2019)  Tobacco Use: Low Risk  (10/01/2023)    Readmission Risk Interventions     No data to display

## 2023-10-05 NOTE — Progress Notes (Signed)
 Subjective: 4 Days Post-Op Procedure(s) (LRB): RIGHT HIP PERCUTANEOUS PINNING (Right) Patient reports pain as mild.   Patient is well, and has had no acute complaints or problems. Denies any CP, SOB, N/V, fevers or chills We will continue therapy today.  Plan is to go Skilled nursing facility after hospital stay.  Objective: Vital signs in last 24 hours: Temp:  [97.7 F (36.5 C)-98.5 F (36.9 C)] 97.7 F (36.5 C) (04/22 0449) Pulse Rate:  [77-82] 79 (04/22 0449) Resp:  [16-18] 18 (04/22 0449) BP: (108-172)/(54-81) 130/54 (04/22 0449) SpO2:  [95 %-97 %] 96 % (04/22 0449) Weight:  [65.6 kg] 65.6 kg (04/21 1426)  Intake/Output from previous day: No intake or output data in the 24 hours ending 10/05/23 0722   Intake/Output this shift: No intake/output data recorded.  Labs: Recent Labs    10/03/23 0457 10/04/23 0357 10/05/23 0439  HGB 13.1  13.1 12.6 12.4   Recent Labs    10/04/23 0357 10/05/23 0439  WBC 14.9* 11.8*  RBC 4.00 3.90  HCT 37.5 36.1  PLT 271 275   Recent Labs    10/04/23 0357 10/05/23 0439  NA 132* 132*  K 4.8 4.6  CL 100 99  CO2 25 24  BUN 31* 32*  CREATININE 0.90 0.83  GLUCOSE 105* 106*  CALCIUM  8.7* 8.6*   No results for input(s): "LABPT", "INR" in the last 72 hours.   EXAM General - Patient is Alert and pleasantly confused, able to answer some questions and engage movements with prompting Extremity - Neurologically intact Neurovascular intact Sensation intact distally Intact pulses distally Dorsiflexion/Plantar flexion intact No cellulitis present Compartment soft Dressing - dressing C/D/I and no drainage Motor Function - intact, moving foot and toes well on exam.   Past Medical History:  Diagnosis Date   Arthritis    COPD (chronic obstructive pulmonary disease) (HCC)    Fibromyalgia    Hypertension    Macular degeneration     Assessment/Plan: 4 Days Post-Op Procedure(s) (LRB): RIGHT HIP PERCUTANEOUS PINNING  (Right) Principal Problem:   Closed right hip fracture (HCC) Active Problems:   Essential hypertension   CAD (coronary artery disease)   AF (paroxysmal atrial fibrillation) (HCC)   SDH (subdural hematoma) (HCC)   Fall at home, initial encounter   Chronic diastolic CHF (congestive heart failure) (HCC)   Chronic kidney disease, stage 3a (HCC)   Hip fracture (HCC)   Malnutrition of moderate degree  Estimated body mass index is 22.65 kg/m as calculated from the following:   Height as of this encounter: 5\' 7"  (1.702 m).   Weight as of this encounter: 65.6 kg. Advance diet Up with therapy Patient will continue to work with physical therapy Discussed with the patient continuing to utilize ice over the bandage Honeycomb bandage intact without drainage VSS Labs show hemoglobin of 12.4 Plan to DC to SNF vs back to assisted living facility  Weight-Bearing as tolerated to right leg DVT prophylaxis: Lovenox  and SCDs Patient will follow-up with [facility] clinic orthopedics in 2 weeks for re-imaging and reevaluation  Bert Britain PA-C [facility] 10/05/2023, 7:22 AM

## 2023-10-05 NOTE — Plan of Care (Signed)
  Problem: Health Behavior/Discharge Planning: Goal: Ability to manage health-related needs will improve Outcome: Progressing   Problem: Clinical Measurements: Goal: Will remain free from infection Outcome: Progressing Goal: Diagnostic test results will improve Outcome: Progressing Goal: Respiratory complications will improve Outcome: Progressing   Problem: Activity: Goal: Risk for activity intolerance will decrease Outcome: Progressing   Problem: Nutrition: Goal: Adequate nutrition will be maintained Outcome: Progressing

## 2023-10-05 NOTE — TOC Progression Note (Signed)
 Transition of Care [facility]) - Progression Note    Patient Details  Name: Eileen Walsh MRN: [id number] Date of Birth: [birth year]  Transition [facility]) CM/SW Contact  Alexandra Ice, RN Phone Number: 10/05/2023, 2:27 PM  Clinical Narrative:     Pending auth Pending PlanAuthID:[id number]        Expected Discharge Plan and Services                                               Social Determinants of Health (SDOH) Interventions SDOH Screenings   Food Insecurity: No Food Insecurity (08/31/2023)   Received from [facility]  Housing: Patient Declined (10/28/2022)   Received from [facility], [facility]  Transportation Needs: No Transportation Needs (08/31/2023)   Received from [facility]  Utilities: Patient Declined (10/28/2022)   Received from [facility], [facility]  Alcohol Screen: Low Risk  (07/15/2021)  Depression (PHQ2-9): Low Risk  (07/15/2021)  Financial Resource Strain: Low Risk  (08/31/2023)   Received from [facility]  Physical Activity: Inactive (10/09/2019)  Social Connections: Moderately Isolated (10/09/2019)  Stress: No Stress Concern Present (10/09/2019)  Tobacco Use: Low Risk  (10/01/2023)    Readmission Risk Interventions     No data to display

## 2023-10-05 NOTE — Progress Notes (Signed)
 PROGRESS NOTE   DEONI COSEY   ZOX:[id number] DOB: Dec 11, 1928  DOA: 09/30/2023 Date of Service: 10/05/23 which is hospital day 5  PCP: [facility] course / significant events:   HPI: Eileen Walsh is a 89 y.o. female with medical history significant of HTN, HLD, CAD, dCHf, GERD, Depression, fibromyalgia, SDH, neuropathy, recent right 5th toe fracture, who [facility] with fall and right hip and right knee pain. Also already has R 5th toe fracture, foot was in boot. Pt states that she lost her balance and fell when she was trying to sit in chair. She injured her right hip and right knee   04/17: X-ray of right knee is negative for acute fracture. X-ray of right hip showed showed possible fracture which is confirmed by CT scan. CT of head showed small subdural hematoma which is stable on repeat CT scan of head 6 hours later. Pt admitted for fall w/ hip fx, ortho to pin in AM.  04/18: repair hip fx  04/19-04/21: PT/OT recs for SNF rehab, placement pending 04/22: per [facility], [facility] can take her tomorrow      Consultants:  Orthopedic surgery  Neurosurgery   Procedures/Surgeries: 10/01/23 Percutaneous pinning of Right femoral neck fracture - Dr Basilio Both       ASSESSMENT & PLAN:   Closed right hip fracture  As evidenced by CT scan, pt has right subcapital femoral neck fracture.  S/p repair w/ ortho Pain control  PT/OT    Leukocytosis - improving w/o intervention Likely due to stress-induced demargination.  Patient does not have signs/symptoms of infection and incision appears normal. NO other criteria for sepsis/SIRS Follow-up CBC   Fall at residence, initial encounter History multiple falls  PT/OT    SDH (subdural hematoma)  pt has small SDH which is stable on repeated CT scan. Dr Felipe Horton, neurosurgery, has reviewed.  Pt can follow outpatient clinic in 2 to 4 weeks with a repeat head CT to evaluate for  any expansion of her subdural hematoma given her age would avoid prophylactic antiepileptic's as these can sometimes exacerbate or cause worsening in neurologic symptoms.  IV hydralazine  as needed for SBP> 160   Essential hypertension IV hydralazine  as needed for SBP> 160 D/c amlodipine  d/t lower BP continue home irbesartan    Chronic diastolic CHF (congestive heart failure) HFpEF 2D echo on 12/08/2021 showed EF of 60 to 65% with grade 1 diastolic dysfunction. CHF is compensated. monitor  CAD (coronary artery disease):  no CP. Pt is not taking ASA or statin Monitor for chest pain    Chronic kidney disease, stage 3a  follow BMP   History of AF (paroxysmal atrial fibrillation)  Heart rate at goal Tele monitoring ok to dc    Right 5th toe fracture: pain control as above   Dementia Stable, pleasantly confused Precautions per protocol  Perioperative Cardiac Risk:  GUPTA score perioperative myocardial infarction or cardaic arrest is 2.84%. Optimized for surgery     No concerns based on BMI: Body mass index is 24.76 kg/m.  Underweight - under 18  overweight - 25 to 29 obese - 30 or more Class 1 obesity: BMI of 30.0 to 34 Class 2 obesity: BMI of 35.0 to 39 Class 3 obesity: BMI of 40.0 to 49 Super Morbid Obesity: BMI 50-59 Super-super Morbid Obesity: BMI 60+ Significantly low or high BMI is associated with higher medical risk.  Weight management advised as adjunct to other disease  management and risk reduction treatments    DVT prophylaxis: lovenox  IV fluids: no continuous IV fluids  Nutrition: soft diet  Central lines / other devices: none  Code Status: FULL CODE ACP documentation reviewed:  none on file in [facility]  TOC needs: SNF rehab - per [facility], [facility] can take her tomorrow  Medical barriers to dispo: none, await placement          Subjective / Brief ROS:  Patient reports hip pain still bothering her today but a bit better from yesterday  Denies  CP/SOB, denies cough, denies fever/chills, denies dysuria/frequency    Family Communication: none at this time     Objective Findings:  Vitals:   10/04/23 2019 10/04/23 2132 10/05/23 0449 10/05/23 0855  BP: (!) 172/55 (!) 144/54 (!) 130/54 90/75  Pulse: 82  79 71  Resp: 18  18 17   Temp: 98.5 F (36.9 C)  97.7 F (36.5 C) 98.2 F (36.8 C)  TempSrc:      SpO2: 97%  96% 97%  Weight:      Height:        Intake/Output Summary (Last 24 hours) at 10/05/2023 1608 Last data filed at 10/05/2023 1100 Gross per 24 hour  Intake 237 ml  Output --  Net 237 ml   Filed Weights   09/30/23 1257 10/04/23 1426  Weight: 71.7 kg 65.6 kg    Examination:  Physical Exam Constitutional:      General: She is not in acute distress. Cardiovascular:     Rate and Rhythm: Normal rate and regular rhythm.  Pulmonary:     Effort: Pulmonary effort is normal.     Breath sounds: Normal breath sounds.  Neurological:     Mental Status: She is alert. Mental status is at baseline. She is disoriented.  Psychiatric:        Mood and Affect: Mood normal.        Behavior: Behavior normal.          Scheduled Medications:   acetaminophen   1,000 mg Oral Q8H   clobetasol  cream  1 Application Topical BID   docusate sodium   100 mg Oral BID   DULoxetine   30 mg Oral Daily   feeding supplement  237 mL Oral BID BM   [START ON 10/06/2023] irbesartan   75 mg Oral Daily   lidocaine   1 patch Transdermal Q24H   multivitamin with minerals  1 tablet Oral Daily   oxyCODONE   5 mg Oral Q4H while awake    Continuous Infusions:    PRN Medications:  bisacodyl , calcium  carbonate, hydrALAZINE , methocarbamol  **OR** methocarbamol  (ROBAXIN ) injection, metoCLOPramide  **OR** metoCLOPramide  (REGLAN ) injection, morphine  injection, ondansetron  **OR** ondansetron  (ZOFRAN ) IV, senna-docusate, sodium phosphate   Antimicrobials from admission:  Anti-infectives (From admission, onward)    Start     Dose/Rate Route Frequency  Ordered Stop   10/01/23 2100  ceFAZolin  (ANCEF ) IVPB 2g/100 mL premix        2 g 200 mL/hr over 30 Minutes Intravenous Every 6 hours 10/01/23 1705 10/02/23 0951   10/01/23 1400  ceFAZolin  (ANCEF ) IVPB 2g/100 mL premix        2 g 200 mL/hr over 30 Minutes Intravenous  Once 09/30/23 2107 10/01/23 1520           Data Reviewed:  I have personally reviewed the following...  CBC: Recent Labs  Lab 10/01/23 0520 10/02/23 0511 10/03/23 0457 10/04/23 0357 10/05/23 0439  WBC 10.2 12.1* 20.1*  20.1* 14.9* 11.8*  NEUTROABS  --   --  17.8*  --   --   HGB 13.5 13.3 13.1  13.1 12.6 12.4  HCT 38.2 38.5 38.8  37.6 37.5 36.1  MCV 91.0 92.5 94.6  90.4 93.8 92.6  PLT 242 220 266  269 271 275   Basic Metabolic Panel: Recent Labs  Lab 10/01/23 0520 10/02/23 0511 10/03/23 0457 10/04/23 0357 10/05/23 0439  NA 131* 131* 134* 132* 132*  K 4.0 5.0 4.4 4.8 4.6  CL 101 101 100 100 99  CO2 23 21* 23 25 24   GLUCOSE 102* 141* 124* 105* 106*  BUN 14 22 34* 31* 32*  CREATININE 0.82 1.04* 0.97 0.90 0.83  CALCIUM  8.6* 8.7* 9.0 8.7* 8.6*   GFR: Estimated Creatinine Clearance: 42.1 mL/min (by C-G formula based on SCr of 0.83 mg/dL). Liver Function Tests: No results for input(s): "AST", "ALT", "ALKPHOS", "BILITOT", "PROT", "ALBUMIN" in the last 168 hours. No results for input(s): "LIPASE", "AMYLASE" in the last 168 hours. No results for input(s): "AMMONIA" in the last 168 hours. Coagulation Profile: Recent Labs  Lab 09/30/23 2318  INR 1.1   Cardiac Enzymes: No results for input(s): "CKTOTAL", "CKMB", "CKMBINDEX", "TROPONINI" in the last 168 hours. BNP (last 3 results) No results for input(s): "PROBNP" in the last 8760 hours. HbA1C: No results for input(s): "HGBA1C" in the last 72 hours. CBG: No results for input(s): "GLUCAP" in the last 168 hours. Lipid Profile: No results for input(s): "CHOL", "HDL", "LDLCALC", "TRIG", "CHOLHDL", "LDLDIRECT" in the last 72 hours. Thyroid   Function Tests: No results for input(s): "TSH", "T4TOTAL", "FREET4", "T3FREE", "THYROIDAB" in the last 72 hours. Anemia Panel: No results for input(s): "VITAMINB12", "FOLATE", "FERRITIN", "TIBC", "IRON", "RETICCTPCT" in the last 72 hours. Most Recent Urinalysis On File:     Component Value Date/Time   COLORURINE YELLOW (A) 07/16/2023 1950   APPEARANCEUR CLOUDY (A) 07/16/2023 1950   LABSPEC 1.015 07/16/2023 1950   PHURINE 5.0 07/16/2023 1950   GLUCOSEU NEGATIVE 07/16/2023 1950   HGBUR NEGATIVE 07/16/2023 1950   BILIRUBINUR NEGATIVE 07/16/2023 1950   KETONESUR NEGATIVE 07/16/2023 1950   PROTEINUR 30 (A) 07/16/2023 1950   NITRITE NEGATIVE 07/16/2023 1950   LEUKOCYTESUR MODERATE (A) 07/16/2023 1950   Sepsis Labs: @LABRCNTIP (procalcitonin:4,lacticidven:4) Microbiology: No results found for this or any previous visit (from the past 240 hours).    Radiology Studies last 3 days: DG HIP UNILAT WITH PELVIS 2-3 VIEWS RIGHT Result Date: 10/04/2023 CLINICAL DATA:  Hip fracture, status post right hip pinning. EXAM: DG HIP (WITH OR WITHOUT PELVIS) 2-3V RIGHT COMPARISON:  Preoperative imaging FINDINGS: Three screws traverse right femoral neck fracture. No periprosthetic lucency. Recent postsurgical change includes air and edema in the soft tissues. IMPRESSION: ORIF of right femoral neck fracture. Electronically Signed   By: Chadwick Colonel M.D.   On: 10/04/2023 18:27         Yachet Mattson, DO [facility] 10/05/2023, 4:08 PM    Dictation software may have been used to generate the above note. Typos may occur and escape review in typed/dictated notes. Please contact Dr Authur Leghorn directly for clarity if needed.  Staff may message me via secure chat in Epic  but this may not receive an immediate response,  please page me for urgent matters!  If 7PM-7AM, please contact night coverage [email or web address]

## 2023-10-05 NOTE — Progress Notes (Signed)
 Occupational Therapy Treatment Patient Details Name: Eileen Walsh MRN: [id number] DOB: Oct 20, 1928 Today's Date: 10/05/2023   History of present illness Pt is a 89 y.o. female s/p fall trying to get to the chair (c/o R hip and knee pain).  Imaging showing: Small focus of subdural hemorrhage over the right frontal convexity measuring up to 3.6 mm in maximum thickness. No significant mass effect. No midline shift.; Subcapital fracture of the right femoral head and neck junction; Angulated comminuted fracture of the distal diaphysis and neck of  the fifth metatarsal.  Pt admitted with closed R hip fx, leukocytosis, SDH (stable on repeated CT scan), and R 5th toe fx.  S/p percutaneous pinning of R femoral neck fx 10/01/23.  PMH includes htn, a-fib with RVR, CAD, htn, SDH, fibromyalgia, neuropathy, recent R 5th toe fx, breast sx.   OT comments  Pt seen for OT tx with PTA to optimize ADL/mobility efforts. Pt alert, intermittently tangential and hard to understand speech. RLE externally rotated and painful requiring increased time/effort by PTA to bring RLE to neutral positioning. Pt required MAX-TOTAL A +2 for bed mobility and demonstrating poor balance with several uncontrolled LOB R lateral/posterior and pt unable to self correct requiring significant assist to bring to midline. Pt engaged in dynamic reaching during grooming and seated self feeding tasks with a few notable R lateral and posterior LOB requiring MAX A to correct. Pt balance and cognition presenting more of a challenge today. Pt continues to benefit from skilled OT services.       If plan is discharge home, recommend the following:  A lot of help with bathing/dressing/bathroom;Help with stairs or ramp for entrance;Supervision due to cognitive status;Two people to help with walking and/or transfers   Equipment Recommendations  BSC/3in1    Recommendations for Other Services      Precautions / Restrictions Precautions Precautions:  Fall Recall of Precautions/Restrictions: Impaired Restrictions Weight Bearing Restrictions Per Provider Order: Yes RLE Weight Bearing Per Provider Order: Weight bearing as tolerated       Mobility Bed Mobility Overal bed mobility: Needs Assistance Bed Mobility: Supine to Sit, Sit to Supine     Supine to sit: Max assist, Total assist, +2 for safety/equipment Sit to supine: Max assist, Total assist, +2 for safety/equipment   General bed mobility comments: increased time and cues but she is able to assist today    Transfers                   General transfer comment: deferred due to sitting balance     Balance Overall balance assessment: Needs assistance Sitting-balance support: Bilateral upper extremity supported, Feet supported Sitting balance-Leahy Scale: Poor Sitting balance - Comments: able to hold baalnce briefly at times but often quickly leans right/back needing assist to prevent falls Postural control: Posterior lean, Right lateral lean         ADL either performed or assessed with clinical judgement   ADL Overall ADL's : Needs assistance/impaired Eating/Feeding: Sitting;Set up;Minimal assistance Eating/Feeding Details (indicate cue type and reason): PRN MIN A, assist for sitting balance intermittently and pt unable to correct Grooming: Sitting;Set up;Contact guard assist       Lower Body Dressing: Bed level;Maximal assistance Lower Body Dressing Details (indicate cue type and reason): socks           Communication Communication Communication: Impaired Factors Affecting Communication: Hearing impaired   Cognition Arousal: Alert Behavior During Therapy: Restless Cognition: History of cognitive impairments, Cognition impaired   Orientation  impairments: Person Awareness: Intellectual awareness intact, Online awareness impaired Memory impairment (select all impairments): Short-term memory, 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[employer], Conservation officer, historic buildings Attention  impairment (select first level of impairment): Selective attention Executive functioning impairment (select all impairments): Problem solving      Following commands: Intact Following commands impaired: Follows one step commands with increased time, Follows one step commands inconsistently      Cueing   Cueing Techniques: Verbal cues, Gestural cues, Tactile cues, Visual cues  Exercises Other Exercises Other Exercises: Pt engaged in dynamic reaching during grooming and seated self feeding tasks with a few notable R lateral and posterior LOB requiring MAX A to correct            Pertinent Vitals/ Pain       Pain Assessment Pain Assessment: Faces Faces Pain Scale: Hurts whole lot Pain Location: when stretching RLE back to neutral Pain Descriptors / Indicators: Sharp, Discomfort, Guarding, Grimacing Pain Intervention(s): Limited activity within patient's tolerance, Monitored during session, Repositioned  Home Living                                          Prior Functioning/Environment              Frequency  Min 2X/week        Progress Toward Goals  OT Goals(current goals can now be found in the care plan section)  Progress towards OT goals: Progressing toward goals;OT to reassess next treatment  Acute Rehab OT Goals Patient Stated Goal: go home OT Goal Formulation: With patient/family Time For Goal Achievement: 10/16/23 Potential to Achieve Goals: Fair  Plan      Co-evaluation    PT/OT/SLP Co-Evaluation/Treatment: Yes Reason for Co-Treatment: Complexity of the patient's impairments (multi-system involvement) PT goals addressed during session: Mobility/safety with mobility OT goals addressed during session: ADL's and self-care;Strengthening/ROM      AM-PAC OT "6 Clicks" Daily Activity     Outcome Measure   Help from another person eating meals?: None Help from another person taking care of personal grooming?: A Little Help from  another person toileting, which includes using toliet, bedpan, or urinal?: A Lot Help from another person bathing (including washing, rinsing, drying)?: A Lot Help from another person to put on and taking off regular upper body clothing?: A Little Help from another person to put on and taking off regular lower body clothing?: A Lot 6 Click Score: 16    End of Session    OT Visit Diagnosis: Other abnormalities of gait and mobility (R26.89);Muscle weakness (generalized) (M62.81)   Activity Tolerance Patient tolerated treatment well   Patient Left in bed;with call bell/phone within reach;with bed alarm set;with nursing/sitter in room   Nurse Communication Mobility status        Time: 2353-6144 OT Time Calculation (min): 31 min  Charges: OT General Charges $OT Visit: 1 Visit OT Treatments $Self Care/Home Management : 8-22 mins  Berenda Breaker., MPH, MS, OTR/L ascom [phone] 10/05/23, 10:21 AM

## 2023-10-06 MED ORDER — POLYETHYLENE GLYCOL 3350 17 G PO PACK: 17.0000 g | PACK | Freq: Every day | ORAL | Status: DC

## 2023-10-06 NOTE — Plan of Care (Signed)

## 2023-10-06 NOTE — Progress Notes (Signed)
 Physical Therapy Treatment Patient Details Name: Eileen Walsh MRN: [id number] DOB: 07-18-1928 Today's Date: 10/06/2023   History of Present Illness Pt is a 89 y.o. female s/p fall trying to get to the chair (c/o R hip and knee pain).  Imaging showing: Small focus of subdural hemorrhage over the right frontal convexity measuring up to 3.6 mm in maximum thickness. No significant mass effect. No midline shift.; Subcapital fracture of the right femoral head and neck junction; Angulated comminuted fracture of the distal diaphysis and neck of  the fifth metatarsal.  Pt admitted with closed R hip fx, leukocytosis, SDH (stable on repeated CT scan), and R 5th toe fx.  S/p percutaneous pinning of R femoral neck fx 10/01/23.  PMH includes htn, a-fib with RVR, CAD, htn, SDH, fibromyalgia, neuropathy, recent R 5th toe fx, breast sx.    PT Comments  Pt was long sitting in bed upon arrival. She is A but only truly oriented to self. Unaware of current reason for hospitalization. She remains pleasantly confused throughout. She endorsed need to urinated and was agreeable to use of BSC. Max assist + increased time to achieve EOB sitting. Pt stood 2 x EOB (elevated) to RW with author blocking knees + max assist. Pt did stand pivot without AD with author blocking knees to [facility] then to recliner. Recommend +2 assist or use of stedy with returning to bed from recliner. Acute PT will continue to follow. Recommend DC to SNF    If plan is discharge home, recommend the following: Two people to help with walking and/or transfers;Two people to help with bathing/dressing/bathroom;Assistance with cooking/housework;Direct supervision/assist for medications management;Direct supervision/assist for financial management;Assist for transportation;Help with stairs or ramp for entrance;Supervision due to cognitive status     Equipment Recommendations  Other (comment) (Defer to next level of care)       Precautions / Restrictions  Precautions Precautions: Fall Recall of Precautions/Restrictions: Impaired Restrictions Weight Bearing Restrictions Per Provider Order: Yes RLE Weight Bearing Per Provider Order: Weight bearing as tolerated     Mobility  Bed Mobility Overal bed mobility: Needs Assistance Bed Mobility: Supine to Sit  Supine to sit: Max assist, Total assist, HOB elevated, Used rails   Transfers Overall transfer level: Needs assistance Equipment used: Rolling walker (2 wheels), None Transfers: Sit to/from Stand, Bed to chair/wheelchair/BSC Sit to Stand: Max assist, From elevated surface Stand pivot transfers: Total assist, From elevated surface  General transfer comment: pt stood with max assist 2 x EOB >RW prior to stand pivot to [facility] form elevated bed height. pt required max assist to stand with author blocking knees however total assist to pivot/take shuffling steps to [facility]. total assistance to then stand pivot from [facility] to recliner    Ambulation/Gait  General Gait Details: currently unable    Balance Overall balance assessment: Needs assistance Sitting-balance support: Bilateral upper extremity supported, Feet supported Sitting balance-Leahy Scale: Poor     Standing balance support: Bilateral upper extremity supported, During functional activity Standing balance-Leahy Scale: Poor     Communication Communication Communication: Impaired (HOH) Factors Affecting Communication: Hearing impaired  Cognition Arousal: Alert Behavior During Therapy: WFL for tasks assessed/performed   PT - Cognitive impairments: History of cognitive impairments      PT - Cognition Comments: Pt is A but pleasantly confused. Needs increased time to process and to perfrom all task. Unaware of situation or reason for hospitalization Following commands: Intact Following commands impaired: Follows one step commands with increased time, Follows one step commands  inconsistently    Cueing Cueing Techniques: Verbal cues,  Tactile cues, Visual cues     General Comments General comments (skin integrity, edema, etc.): pt did successfully urinate on BSC prior to stand pivoting to recliner. RN aware and encouraged to use stedy/ +2 assist to return pt to bed later in the day      Pertinent Vitals/Pain Pain Assessment Pain Assessment: PAINAD Breathing: normal Negative Vocalization: occasional moan/groan, low speech, negative/disapproving quality Facial Expression: smiling or inexpressive Body Language: relaxed Consolability: no need to console PAINAD Score: 1 Pain Location: "all over."     PT Goals (current goals can now be found in the care plan section) Acute Rehab PT Goals Patient Stated Goal: none stated Progress towards PT goals: Progressing toward goals    Frequency    7X/week           Co-evaluation     PT goals addressed during session: Mobility/safety with mobility;Balance;Proper use of DME;Strengthening/ROM        AM-PAC PT "6 Clicks" Mobility   Outcome Measure  Help needed turning from your back to your side while in a flat bed without using bedrails?: A Lot Help needed moving from lying on your back to sitting on the side of a flat bed without using bedrails?: A Lot Help needed moving to and from a bed to a chair (including a wheelchair)?: Total Help needed standing up from a chair using your arms (e.g., wheelchair or bedside chair)?: Total Help needed to walk in hospital room?: Total Help needed climbing 3-5 steps with a railing? : Total 6 Click Score: 8    End of Session   Activity Tolerance: Patient tolerated treatment well Patient left: in chair;with call bell/phone within reach;with chair alarm set Nurse Communication: Mobility status PT Visit Diagnosis: Other abnormalities of gait and mobility (R26.89);Unsteadiness on feet (R26.81);Muscle weakness (generalized) (M62.81);History of falling (Z91.81);Pain Pain - Right/Left: Right Pain - part of body: Hip     Time:  7829-5621 PT Time Calculation (min) (ACUTE ONLY): 21 min  Charges:    $Therapeutic Activity: 8-22 mins PT General Charges $$ ACUTE PT VISIT: 1 Visit                     Chester Costa PTA 10/06/23, 9:51 AM

## 2023-10-06 NOTE — Discharge Summary (Signed)
 Physician Discharge Summary  Patient: Eileen Walsh HYQ:[id number] DOB: Jan 30, 1929   Code Status: Full Code Admit date: 09/30/2023 Discharge date: 10/06/2023 Disposition: Skilled nursing facility, PT, OT, nurse aid, RN, Child psychotherapist, and wound care PCP: [employer], [facility]  Recommendations for Outpatient Follow-up:  Follow up with PCP within 1-2 weeks Regarding general hospital follow up and preventative care Follow up with ortho Follow up with neurosurgery Regarding surveillance of the stable, small SDH  Discharge Diagnoses:  Principal Problem:   Closed right hip fracture (HCC) Active Problems:   Fall at home, initial encounter   SDH (subdural hematoma) (HCC)   Essential hypertension   Chronic diastolic CHF (congestive heart failure) (HCC)   CAD (coronary artery disease)   Chronic kidney disease, stage 3a (HCC)   AF (paroxysmal atrial fibrillation) (HCC)   Hip fracture (HCC)   Malnutrition of moderate degree  Brief Hospital Course Summary: Eileen Walsh is a 89 y.o. female with medical history significant of HTN, HLD, CAD, dCHf, GERD, Depression, fibromyalgia, SDH, neuropathy, recent right 5th toe fracture, who presentsto ED via [facility] from [facility] with fall and right hip and right knee pain. Also already has R 5th toe fracture, foot was in boot. Pt states that she lost her balance and fell when she was trying to sit in chair. She injured her right hip and right knee    04/17: X-ray of right knee is negative for acute fracture. X-ray of right hip showed showed possible fracture which is confirmed by CT scan. CT of head showed small subdural hematoma which is stable on repeat CT scan of head 6 hours later. Pt admitted for fall w/ hip fx, ortho to pin in AM.  04/18: repair hip fx  04/19-04/21: PT/OT recs for SNF rehab, placement pending 4/23: patient remains stable and is able to dc to SNF today. Pain medications were prescribed by surgical team. Added on stool  softener for constipation prophylaxis.   All other chronic conditions were treated with home medications.    Discharge Condition: Good, improved Recommended discharge diet: dysphagia 3, thin liquids  Consultations: Ortho surgery  Neurosurgery   Procedures/Studies: ORIF right hip  Allergies as of 10/06/2023       Reactions   Aspirin  Other (See Comments)   Sulfa Antibiotics Hives, Other (See Comments)        Medication List     TAKE these medications    acetaminophen  500 MG tablet Commonly known as: TYLENOL  Take 1,000 mg by mouth every 8 (eight) hours as needed for mild pain (pain score 1-3) or moderate pain (pain score 4-6).   amLODipine -olmesartan  10-40 MG tablet Commonly known as: AZOR  Take 1 tablet by mouth daily.   calcium  carbonate 500 MG chewable tablet Commonly known as: TUMS - dosed in mg elemental calcium  Chew 2 tablets by mouth every 4 (four) hours as needed for indigestion or heartburn.   clobetasol  cream 0.05 % Commonly known as: TEMOVATE  Apply 1 Application topically 2 (two) times daily.   DULoxetine  30 MG capsule Commonly known as: CYMBALTA  Take 1 capsule (30 mg total) by mouth daily.   enoxaparin  40 MG/0.4ML injection Commonly known as: LOVENOX  Inject 0.4 mLs (40 mg total) into the skin daily for 28 days.   LUTEIN  PO Take 1 tablet by mouth daily.   oxyCODONE  5 MG immediate release tablet Commonly known as: Oxy IR/ROXICODONE  Take 0.5-1 tablets (2.5-5 mg total) by mouth every 4 (four) hours as needed for moderate pain (pain score  4-6) (pain score 4-6).   polyethylene glycol 17 g packet Commonly known as: MiraLax  Take 17 g by mouth daily.   traMADol  50 MG tablet Commonly known as: ULTRAM  Take 1 tablet (50 mg total) by mouth every 6 (six) hours as needed for moderate pain (pain score 4-6).        Contact information for follow-up providers     Bert Britain, PA-C Follow up in 2 week(s).   Specialty: Orthopedic Surgery Why: For staple  movable and x-rays of the right hip Contact information: [address] [place] [address] [phone]              Contact information for after-discharge care     Destination     [facility] Preferred SNF .   Service: Skilled Nursing Contact information: [address] [place]  [phone]                     Subjective   Pt reports feeling well today. No complaints. Ready for dc.   All questions and concerns were addressed at time of discharge.  Objective  Blood pressure (!) 173/60, pulse 75, temperature 98.2 F (36.8 C), resp. rate 19, height 5\' 7"  (1.702 m), weight 65.6 kg, SpO2 96%.   General: Pt is alert, awake, not in acute distress Cardiovascular: RRR, S1/S2 +, no rubs, no gallops Respiratory: CTA bilaterally, no wheezing, no rhonchi Abdominal: Soft, NT, ND, bowel sounds + Extremities: no edema, no cyanosis  The results of significant diagnostics from this hospitalization (including imaging, microbiology, ancillary and laboratory) are listed below for reference.   Imaging studies: DG HIP UNILAT WITH PELVIS 2-3 VIEWS RIGHT Result Date: 10/04/2023 CLINICAL DATA:  Hip fracture, status post right hip pinning. EXAM: DG HIP (WITH OR WITHOUT PELVIS) 2-3V RIGHT COMPARISON:  Preoperative imaging FINDINGS: Three screws traverse right femoral neck fracture. No periprosthetic lucency. Recent postsurgical change includes air and edema in the soft tissues. IMPRESSION: ORIF of right femoral neck fracture. Electronically Signed   By: Chadwick Colonel M.D.   On: 10/04/2023 18:27   DG FEMUR, MIN 2 VIEWS RIGHT Result Date: 10/01/2023 CLINICAL DATA:  Right hip pending EXAM: Intraoperative fluoroscopy COMPARISON:  X-ray and CT 09/30/2023 FINDINGS: Six fluoroscopic spot images submitted for review demonstrates placement of 3 longitudinal  screws transfixing the subcapital femoral neck fracture. Imaging was obtained to aid in treatment. Please correlate with real-time fluoroscopy of 48 seconds. Cumulative dose 7.53 mGy. IMPRESSION: Intraoperative fluoroscopy Electronically Signed   By: Adrianna Horde M.D.   On: 10/01/2023 20:01   DG Pelvis Portable Result Date: 10/01/2023 CLINICAL DATA:  Right hip fracture. EXAM: PORTABLE PELVIS 1-2 VIEWS COMPARISON:  CT of the right hip dated 09/30/2023. FINDINGS: Subcapital fracture of the right femoral head and neck junction is again noted. The right femoral head is seated within the acetabulum. Sacroiliac joints and pubic symphysis are anatomically aligned. IMPRESSION: Subcapital fracture of the right femoral head and neck junction. Electronically Signed   By: Mannie Seek M.D.   On: 10/01/2023 15:57   DG C-Arm 1-60 Min-No Report Result Date: 10/01/2023 Fluoroscopy was utilized by the requesting physician.  No radiographic interpretation.   DG Foot Complete Right Result Date: 09/30/2023 CLINICAL DATA:  Foot injury. EXAM: RIGHT FOOT COMPLETE - 3+ VIEW COMPARISON:  None Available. FINDINGS: The bones are osteopenic. There is an angulated comminuted fracture of the distal diaphysis  and neck of the fifth metatarsal. This is age indeterminate and may be acute. There is apex lateral angulation and 1 after chest with medial displacement of the distal fracture fragment. There is no dislocation. There is a healed distal fourth metatarsal fracture. There are moderate degenerative changes at the first metatarsophalangeal joint with Alex valgus. There is soft tissue swelling of the dorsum of the foot. IMPRESSION: 1. Angulated comminuted fracture of the distal diaphysis and neck of the fifth metatarsal. This is age indeterminate and may be acute. 2. Healed distal fourth metatarsal fracture. 3. Moderate degenerative changes at the first metatarsophalangeal joint with mild hallux valgus. Electronically Signed   By:  Tyron Gallon M.D.   On: 09/30/2023 21:53   CT Head Wo Contrast Result Date: 09/30/2023 CLINICAL DATA:  Subdural hematoma. EXAM: CT HEAD WITHOUT CONTRAST TECHNIQUE: Contiguous axial images were obtained from the base of the skull through the vertex without intravenous contrast. RADIATION DOSE REDUCTION: This exam was performed according to the departmental dose-optimization program which includes automated exposure control, adjustment of the mA and/or kV according to patient size and/or use of iterative reconstruction technique. COMPARISON:  CT head from earlier today. FINDINGS: Brain: Unchanged small subdural hemorrhage along the right cerebral convexity without substantial mass effect. No midline shift. Similar patchy white matter hypodensities, compatible with chronic microvascular disease. No evidence of acute large vascular territory infarct, mass lesion, or hydrocephalus. Vascular: Calcific atherosclerosis.  No hyperdense vessel. Skull: No acute fracture. Sinuses/Orbits: Clear sinuses.  No acute orbital findings. Other: No mastoid effusions. IMPRESSION: Unchanged small subdural hemorrhage along the right cerebral convexity without substantial mass effect. Electronically Signed   By: Stevenson Elbe M.D.   On: 09/30/2023 21:49   CT Hip Right Wo Contrast Result Date: 09/30/2023 CLINICAL DATA:  Fall, right hip pain. EXAM: CT OF THE RIGHT HIP WITHOUT CONTRAST TECHNIQUE: Multidetector CT imaging of the right hip was performed according to the standard protocol. Multiplanar CT image reconstructions were also generated. RADIATION DOSE REDUCTION: This exam was performed according to the departmental dose-optimization program which includes automated exposure control, adjustment of the mA and/or kV according to patient size and/or use of iterative reconstruction technique. COMPARISON:  Plain films today. FINDINGS: Bones/Joint/Cartilage There is a subcapital right femoral neck fracture with minimal  displacement/angulation. No subluxation or dislocation. Ligaments Suboptimally assessed by CT. Muscles and Tendons Negative Soft tissues Negative IMPRESSION: Right subcapital femoral neck fracture. Electronically Signed   By: Janeece Mechanic M.D.   On: 09/30/2023 19:20   CT HEAD WO CONTRAST ( ) Addendum Date: 09/30/2023 ADDENDUM REPORT: 09/30/2023 16:43 ADDENDUM: These results were called by telephone at the time of interpretation on 09/30/2023 at 4:28 pm to provider Dr. Vicenta Graft, Who verbally acknowledged these results. Electronically Signed   By: Denny Flack M.D.   On: 09/30/2023 16:43   Result Date: 09/30/2023 CLINICAL DATA:  Trauma, fall with possible head strike. EXAM: CT HEAD WITHOUT CONTRAST CT CERVICAL SPINE WITHOUT CONTRAST TECHNIQUE: Multidetector CT imaging of the head and cervical spine was performed following the standard protocol without intravenous contrast. Multiplanar CT image reconstructions of the cervical spine were also generated. RADIATION DOSE REDUCTION: This exam was performed according to the departmental dose-optimization program which includes automated exposure control, adjustment of the mA and/or kV according to patient size and/or use of iterative reconstruction technique. COMPARISON:  07/16/2023. FINDINGS: CT HEAD FINDINGS Brain: Focus of isoattenuating to slightly hyperattenuating fluid overlying the right frontal convexity measuring up to 3.6 mm in maximum thickness. No  significant mass effect. No midline shift. No evidence of parenchymal hemorrhage. Nonspecific hypoattenuation in the periventricular and subcortical white matter favored to reflect chronic microvascular ischemic changes. No CT evidence of acute infarct. The basilar cisterns are patent. Ventricles: Prominence of the ventricles suggesting underlying parenchymal volume loss. Vascular: Atherosclerotic calcifications of the carotid siphons and intracranial vertebral arteries. No hyperdense vessel. Skull: No acute or  aggressive finding. Orbits: Orbits are symmetric. Sinuses: The visualized paranasal sinuses are clear. Other: Mastoid air cells are clear. Traumatic Brain Injury Risk Stratification Skull Fracture: No - Low/mBIG 1 Subdural Hematoma (SDH): <95mm - mBIG 1 Subarachnoid Hemorrhage [facility]): No Epidural Hematoma (EDH): No - Low/mBIG 1 Cerebral contusion, intra-axial, intraparenchymal Hemorrhage (IPH): No Intraventricular Hemorrhage (IVH): No - Low/mBIG 1 Midline Shift > 1mm or Edema/effacement of sulci/vents: No - Low/mBIG 1 ---------------------------------------------------- CT CERVICAL SPINE FINDINGS Alignment: Cervical lordosis is maintained. Trace anterolisthesis of C3 on C4 and C4 on C5. Additional 2 mm anterolisthesis of C5 on C6. No facet subluxation or dislocation. Skull base and vertebrae: No compression fracture or displaced fracture in the cervical spine. No suspicious osseous lesion. Soft tissues and spinal canal: No prevertebral fluid or swelling. No visible canal hematoma. Disc levels: Intervertebral disc space narrowing at multiple levels. No high-grade osseous spinal canal stenosis. Facet arthrosis at multiple levels. Foraminal narrowing is most pronounced at C4-5. Upper chest: Negative. Other: None. IMPRESSION: Small focus of subdural hemorrhage over the right frontal convexity measuring up to 3.6 mm in maximum thickness. No significant mass effect. No midline shift. No acute fracture or traumatic malalignment of the cervical spine. Similar chronic and degenerative changes as above. Electronically Signed: By: Denny Flack M.D. On: 09/30/2023 16:24   CT Cervical Spine Wo Contrast Addendum Date: 09/30/2023 ADDENDUM REPORT: 09/30/2023 16:43 ADDENDUM: These results were called by telephone at the time of interpretation on 09/30/2023 at 4:28 pm to provider Dr. Vicenta Graft, Who verbally acknowledged these results. Electronically Signed   By: Denny Flack M.D.   On: 09/30/2023 16:43   Result Date:  09/30/2023 CLINICAL DATA:  Trauma, fall with possible head strike. EXAM: CT HEAD WITHOUT CONTRAST CT CERVICAL SPINE WITHOUT CONTRAST TECHNIQUE: Multidetector CT imaging of the head and cervical spine was performed following the standard protocol without intravenous contrast. Multiplanar CT image reconstructions of the cervical spine were also generated. RADIATION DOSE REDUCTION: This exam was performed according to the departmental dose-optimization program which includes automated exposure control, adjustment of the mA and/or kV according to patient size and/or use of iterative reconstruction technique. COMPARISON:  07/16/2023. FINDINGS: CT HEAD FINDINGS Brain: Focus of isoattenuating to slightly hyperattenuating fluid overlying the right frontal convexity measuring up to 3.6 mm in maximum thickness. No significant mass effect. No midline shift. No evidence of parenchymal hemorrhage. Nonspecific hypoattenuation in the periventricular and subcortical white matter favored to reflect chronic microvascular ischemic changes. No CT evidence of acute infarct. The basilar cisterns are patent. Ventricles: Prominence of the ventricles suggesting underlying parenchymal volume loss. Vascular: Atherosclerotic calcifications of the carotid siphons and intracranial vertebral arteries. No hyperdense vessel. Skull: No acute or aggressive finding. Orbits: Orbits are symmetric. Sinuses: The visualized paranasal sinuses are clear. Other: Mastoid air cells are clear. Traumatic Brain Injury Risk Stratification Skull Fracture: No - Low/mBIG 1 Subdural Hematoma (SDH): <59mm - mBIG 1 Subarachnoid Hemorrhage [facility]): No Epidural Hematoma (EDH): No - Low/mBIG 1 Cerebral contusion, intra-axial, intraparenchymal Hemorrhage (IPH): No Intraventricular Hemorrhage (IVH): No - Low/mBIG 1 Midline Shift > 1mm or Edema/effacement of sulci/vents: No -  Low/mBIG 1 ---------------------------------------------------- CT CERVICAL SPINE FINDINGS Alignment:  Cervical lordosis is maintained. Trace anterolisthesis of C3 on C4 and C4 on C5. Additional 2 mm anterolisthesis of C5 on C6. No facet subluxation or dislocation. Skull base and vertebrae: No compression fracture or displaced fracture in the cervical spine. No suspicious osseous lesion. Soft tissues and spinal canal: No prevertebral fluid or swelling. No visible canal hematoma. Disc levels: Intervertebral disc space narrowing at multiple levels. No high-grade osseous spinal canal stenosis. Facet arthrosis at multiple levels. Foraminal narrowing is most pronounced at C4-5. Upper chest: Negative. Other: None. IMPRESSION: Small focus of subdural hemorrhage over the right frontal convexity measuring up to 3.6 mm in maximum thickness. No significant mass effect. No midline shift. No acute fracture or traumatic malalignment of the cervical spine. Similar chronic and degenerative changes as above. Electronically Signed: By: Denny Flack M.D. On: 09/30/2023 16:24   DG Hip Unilat W or Wo Pelvis 2-3 Views Right Result Date: 09/30/2023 CLINICAL DATA:  Pain after fall EXAM: DG HIP (WITH OR WITHOUT PELVIS) 2-3V RIGHT COMPARISON:  None Available. FINDINGS: Osteopenia. Preserved joint spaces. Mild hypertrophic changes along the pubic symphysis. There is subtle irregularity along the cortex and along the trabecula of the subcapital right femoral neck. There is also cortical irregularity with step-off laterally about the femoral head/neck. Worrisome for nondisplaced fracture. Elsewhere there presumed vascular calcifications in the pelvis. IMPRESSION: Severe osteopenia. Irregularity along the subcapital right femoral neck with a step-off laterally on the frontal view of the hip, worrisome for nondisplaced fracture. Electronically Signed   By: Adrianna Horde M.D.   On: 09/30/2023 16:20   DG Knee Complete 4 Views Right Result Date: 09/30/2023 CLINICAL DATA:  Pain after fall EXAM: RIGHT KNEE - COMPLETE 2 VIEW COMPARISON:  None  Available. FINDINGS: Osteopenia. No fracture or dislocation. Preserved joint spaces. Minimal osteophytes of the medial compartment. No joint effusion on lateral view. Vascular calcifications seen posterior to the knee. IMPRESSION: Osteopenia.  Slight degenerative changes. Electronically Signed   By: Adrianna Horde M.D.   On: 09/30/2023 16:09    Labs: Basic Metabolic Panel: Recent Labs  Lab 10/01/23 0520 10/02/23 0511 10/03/23 0457 10/04/23 0357 10/05/23 0439  NA 131* 131* 134* 132* 132*  K 4.0 5.0 4.4 4.8 4.6  CL 101 101 100 100 99  CO2 23 21* 23 25 24   GLUCOSE 102* 141* 124* 105* 106*  BUN 14 22 34* 31* 32*  CREATININE 0.82 1.04* 0.97 0.90 0.83  CALCIUM  8.6* 8.7* 9.0 8.7* 8.6*   CBC: Recent Labs  Lab 10/01/23 0520 10/02/23 0511 10/03/23 0457 10/04/23 0357 10/05/23 0439  WBC 10.2 12.1* 20.1*  20.1* 14.9* 11.8*  NEUTROABS  --   --  17.8*  --   --   HGB 13.5 13.3 13.1  13.1 12.6 12.4  HCT 38.2 38.5 38.8  37.6 37.5 36.1  MCV 91.0 92.5 94.6  90.4 93.8 92.6  PLT 242 220 266  269 271 275   Microbiology: Results for orders placed or performed during the hospital encounter of 12/07/21  Urine Culture     Status: Abnormal   Collection Time: 12/07/21 12:54 PM   Specimen: Urine, Catheterized  Result Value Ref Range Status   Specimen Description   Final    URINE, CATHETERIZED Performed at [facility], [address] [place], [place] [address]    Special Requests   Final    NONE Performed at [facility], [address], [place], [place] [address]  Culture >=100,000 COLONIES/mL ENTEROCOCCUS FAECALIS (A)  Final   Report Status 12/09/2021 FINAL  Final   Organism ID, Bacteria ENTEROCOCCUS FAECALIS (A)  Final      Susceptibility   Enterococcus faecalis - MIC*    AMPICILLIN  <=2 SENSITIVE Sensitive     NITROFURANTOIN <=16 SENSITIVE Sensitive     VANCOMYCIN  1 SENSITIVE Sensitive     * >=100,000 COLONIES/mL ENTEROCOCCUS FAECALIS  Blood culture (routine  x 2)     Status: Abnormal   Collection Time: 12/07/21  1:09 PM   Specimen: BLOOD  Result Value Ref Range Status   Specimen Description   Final    BLOOD LEFT ANTECUBITAL Performed at [facility], [address], [place], [place] [address]    Special Requests   Final    BOTTLES DRAWN AEROBIC AND ANAEROBIC Blood Culture adequate volume Performed at [facility], [address] [place], [place] [address]    Culture  Setup Time   Final    GRAM NEGATIVE RODS AEROBIC BOTTLE ONLY CRITICAL RESULT CALLED TO, READ BACK BY AND VERIFIED WITH: NATHAN BELEUE @0030  ON 03/14/22 [facility] GRAM STAIN REVIEWED-AGREE WITH RESULT Performed at [facility], [address] [place], [place] [address]    Culture ESCHERICHIA COLI (A)  Final   Report Status 12/14/2021 FINAL  Final   Organism ID, Bacteria ESCHERICHIA COLI  Final      Susceptibility   Escherichia coli - MIC*    AMPICILLIN  4 SENSITIVE Sensitive     CEFAZOLIN  <=4 SENSITIVE Sensitive     CEFEPIME  <=0.12 SENSITIVE Sensitive     CEFTAZIDIME <=1 SENSITIVE Sensitive     CEFTRIAXONE  <=0.25 SENSITIVE Sensitive     CIPROFLOXACIN <=0.25 SENSITIVE Sensitive     GENTAMICIN <=1 SENSITIVE Sensitive     IMIPENEM <=0.25 SENSITIVE Sensitive     TRIMETH/SULFA <=20 SENSITIVE Sensitive     AMPICILLIN /SULBACTAM <=2 SENSITIVE Sensitive     PIP/TAZO <=4 SENSITIVE Sensitive     * ESCHERICHIA COLI  Blood Culture ID Panel (Reflexed)     Status: Abnormal   Collection Time: 12/07/21  1:09 PM  Result Value Ref Range Status   Enterococcus faecalis NOT DETECTED NOT DETECTED Final   Enterococcus Faecium NOT DETECTED NOT DETECTED Final   Listeria monocytogenes NOT DETECTED NOT DETECTED Final   Staphylococcus species NOT DETECTED NOT DETECTED Final   Staphylococcus aureus (BCID) NOT DETECTED NOT DETECTED Final   Staphylococcus epidermidis NOT DETECTED NOT DETECTED Final   Staphylococcus lugdunensis NOT DETECTED NOT DETECTED Final   Streptococcus  species NOT DETECTED NOT DETECTED Final   Streptococcus agalactiae NOT DETECTED NOT DETECTED Final   Streptococcus pneumoniae NOT DETECTED NOT DETECTED Final   Streptococcus pyogenes NOT DETECTED NOT DETECTED Final   A.calcoaceticus-baumannii NOT DETECTED NOT DETECTED Final   Bacteroides fragilis NOT DETECTED NOT DETECTED Final   Enterobacterales DETECTED (A) NOT DETECTED Final    Comment: Enterobacterales represent a large order of gram negative bacteria, not a single organism. CRITICAL RESULT CALLED TO, READ BACK BY AND VERIFIED WITH: NATHAN BELEUE @0030  ON 12/12/21 SKL    Enterobacter cloacae complex NOT DETECTED NOT DETECTED Final   Escherichia coli DETECTED (A) NOT DETECTED Final    Comment: CRITICAL RESULT CALLED TO, READ BACK BY AND VERIFIED WITH: NATHAN BELEUE @0030  ON 12/12/21 SKL    Klebsiella aerogenes NOT DETECTED NOT DETECTED Final   Klebsiella oxytoca NOT DETECTED NOT DETECTED Final   Klebsiella pneumoniae NOT DETECTED NOT DETECTED Final   Proteus species  NOT DETECTED NOT DETECTED Final   Salmonella species NOT DETECTED NOT DETECTED Final   Serratia marcescens NOT DETECTED NOT DETECTED Final   Haemophilus influenzae NOT DETECTED NOT DETECTED Final   Neisseria meningitidis NOT DETECTED NOT DETECTED Final   Pseudomonas aeruginosa NOT DETECTED NOT DETECTED Final   Stenotrophomonas maltophilia NOT DETECTED NOT DETECTED Final   Candida albicans NOT DETECTED NOT DETECTED Final   Candida auris NOT DETECTED NOT DETECTED Final   Candida glabrata NOT DETECTED NOT DETECTED Final   Candida krusei NOT DETECTED NOT DETECTED Final   Candida parapsilosis NOT DETECTED NOT DETECTED Final   Candida tropicalis NOT DETECTED NOT DETECTED Final   Cryptococcus neoformans/gattii NOT DETECTED NOT DETECTED Final   CTX-M ESBL NOT DETECTED NOT DETECTED Final   Carbapenem resistance IMP NOT DETECTED NOT DETECTED Final   Carbapenem resistance KPC NOT DETECTED NOT DETECTED Final   Carbapenem  resistance NDM NOT DETECTED NOT DETECTED Final   Carbapenem resist OXA 48 LIKE NOT DETECTED NOT DETECTED Final   Carbapenem resistance VIM NOT DETECTED NOT DETECTED Final    Comment: Performed at [facility], [address] [place], [place] [address]  Blood culture (routine x 2)     Status: None   Collection Time: 12/07/21  1:14 PM   Specimen: BLOOD  Result Value Ref Range Status   Specimen Description BLOOD RIGHT ANTECUBITAL  Final   Special Requests   Final    BOTTLES DRAWN AEROBIC AND ANAEROBIC Blood Culture adequate volume   Culture   Final    NO GROWTH 5 DAYS Performed at [facility], [address] [place], [place] [address]    Report Status 12/12/2021 FINAL  Final    Time coordinating discharge: Over 30 minutes  Ree Candy, MD  [facility] 10/06/2023, 10:47 AM

## 2023-10-06 NOTE — Plan of Care (Signed)
   Problem: Clinical Measurements: Goal: Will remain free from infection Outcome: Progressing Goal: Diagnostic test results will improve Outcome: Progressing Goal: Respiratory complications will improve Outcome: Progressing Goal: Cardiovascular complication will be avoided Outcome: Progressing

## 2023-10-06 NOTE — Progress Notes (Incomplete)
 PROGRESS NOTE  Eileen Walsh    DOB: 1928-07-24, 89 y.o.  ZOX:[id number]    Code Status: Full Code   DOA: 09/30/2023   LOS: 6   Brief hospital course  Eileen Walsh is a 89 y.o. female with a PMH significant for ***.  They presented from *** to the ED on 09/30/2023 with *** x *** days. ***  In the ED, it was found that they had ***.  Significant findings included ***.  They were initially treated with ***.   Patient was admitted to medicine service for further workup and management of *** as outlined in detail below.  10/06/23 -***  Assessment & Plan  Principal Problem:   Closed right hip fracture (HCC) Active Problems:   Fall at home, initial encounter   SDH (subdural hematoma) (HCC)   Essential hypertension   Chronic diastolic CHF (congestive heart failure) (HCC)   CAD (coronary artery disease)   Chronic kidney disease, stage 3a (HCC)   AF (paroxysmal atrial fibrillation) (HCC)   Hip fracture (HCC)   Malnutrition of moderate degree  *** -   *** -   *** -   *** -   *** -   Body mass index is 22.65 kg/m.  VTE ppx: SCDs Start: 10/01/23 1624   Diet:     Diet   DIET DYS 3 Room service appropriate? Yes; Fluid consistency: Thin   Consultants: ***  Subjective 10/06/23    Pt reports ***   Objective   Vitals:   10/05/23 0855 10/05/23 1735 10/05/23 2050 10/06/23 0417  BP: 90/75 (!) 154/57 (!) 169/59 (!) 173/60  Pulse: 71 97 84 75  Resp: 17 17  19   Temp: 98.2 F (36.8 C) 98.7 F (37.1 C) 98.5 F (36.9 C) 98.2 F (36.8 C)  TempSrc:      SpO2: 97% 96% 96% 96%  Weight:      Height:        Intake/Output Summary (Last 24 hours) at 10/06/2023 0736 Last data filed at 10/05/2023 1400 Gross per 24 hour  Intake 437 ml  Output --  Net 437 ml   Filed Weights   09/30/23 1257 10/04/23 1426  Weight: 71.7 kg 65.6 kg     Physical Exam: *** General: awake, alert, NAD HEENT: atraumatic, clear conjunctiva, anicteric sclera, MMM, hearing grossly  normal Respiratory: normal respiratory effort. Cardiovascular: quick capillary refill, normal S1/S2, RRR, no JVD, murmurs Gastrointestinal: soft, NT, ND Nervous: A&O x3. no gross focal neurologic deficits, normal speech Extremities: moves all equally, no edema, normal tone Skin: dry, intact, normal temperature, normal color. No rashes, lesions or ulcers on exposed skin Psychiatry: normal mood, congruent affect  Labs   I have personally reviewed the following labs and imaging studies CBC    Component Value Date/Time   WBC 11.8 (H) 10/05/2023 0439   RBC 3.90 10/05/2023 0439   HGB 12.4 10/05/2023 0439   HGB 14.4 01/14/2021 0948   HCT 36.1 10/05/2023 0439   HCT 42.7 01/14/2021 0948   PLT 275 10/05/2023 0439   PLT 267 01/14/2021 0948   MCV 92.6 10/05/2023 0439   MCV 91 01/14/2021 0948   MCV 93 12/22/2012 1428   MCH 31.8 10/05/2023 0439   MCHC 34.3 10/05/2023 0439   RDW 12.4 10/05/2023 0439   RDW 12.0 01/14/2021 0948   RDW 12.9 12/22/2012 1428   LYMPHSABS 1.0 10/03/2023 0457   LYMPHSABS 2.1 01/14/2021 0948   MONOABS 1.2 (H) 10/03/2023 0457   EOSABS 0.0 10/03/2023  0457   EOSABS 0.2 01/14/2021 0948   BASOSABS 0.0 10/03/2023 0457   BASOSABS 0.1 01/14/2021 0948      Latest Ref Rng & Units 10/05/2023    4:39 AM 10/04/2023    3:57 AM 10/03/2023    4:57 AM  BMP  Glucose 70 - 99 mg/dL 161  096  045   BUN 8 - 23 mg/dL 32  31  34   Creatinine 0.44 - 1.00 mg/dL 4.09  8.11  9.14   Sodium 135 - 145 mmol/L 132  132  134   Potassium 3.5 - 5.1 mmol/L 4.6  4.8  4.4   Chloride 98 - 111 mmol/L 99  100  100   CO2 22 - 32 mmol/L 24  25  23    Calcium  8.9 - 10.3 mg/dL 8.6  8.7  9.0     DG HIP UNILAT WITH PELVIS 2-3 VIEWS RIGHT Result Date: 10/04/2023 CLINICAL DATA:  Hip fracture, status post right hip pinning. EXAM: DG HIP (WITH OR WITHOUT PELVIS) 2-3V RIGHT COMPARISON:  Preoperative imaging FINDINGS: Three screws traverse right femoral neck fracture. No periprosthetic lucency. Recent  postsurgical change includes air and edema in the soft tissues. IMPRESSION: ORIF of right femoral neck fracture. Electronically Signed   By: Chadwick Colonel M.D.   On: 10/04/2023 18:27    Disposition Plan & Communication  Patient status: Inpatient  Admitted From: {From:23814} Planned disposition location: {PLAN; DISPOSITION:26386} Anticipated discharge date: *** pending ***  Family Communication: ***    Author: Ree Candy, DO [facility] 10/06/2023, 7:36 AM   Available by Epic secure chat 7AM-7PM. If 7PM-7AM, please contact night-coverage.  [facility] contact information found on [email or web address].

## 2023-10-06 NOTE — TOC Progression Note (Signed)
 Transition of Care [facility]) - Progression Note    Patient Details  Name: Eileen Walsh MRN: [id number] Date of Birth: 1929/02/15  [facility] CM/SW Contact  Alexandra Ice, RN Phone Number: 10/06/2023, 11:45 AM  Clinical Narrative:    Patient received approval from [facility] for [facility]. Patient received bed assignment. Notified [name], daughter, that patient to discharge today and transport to arrive around 2pm to transport to facility. Provided room number and number for report to bedside nurse. EMS packet printed to nurse station.      Barriers to Discharge: Barriers Resolved  Expected Discharge Plan and Services         Expected Discharge Date: 10/06/23                                     Social Determinants of Health (SDOH) Interventions SDOH Screenings   Food Insecurity: No Food Insecurity (08/31/2023)   Received from [facility]  Housing: Patient Declined (10/28/2022)   Received from [facility], [facility]  Transportation Needs: No Transportation Needs (08/31/2023)   Received from [facility]  Utilities: Patient Declined (10/28/2022)   Received from [facility], [facility]  Alcohol Screen: Low Risk  (07/15/2021)  Depression (PHQ2-9): Low Risk  (07/15/2021)  Financial Resource Strain: Low Risk  (08/31/2023)   Received from [facility]  Physical Activity: Inactive (10/09/2019)  Social Connections: Moderately Isolated (10/09/2019)  Stress: No Stress Concern Present (10/09/2019)  Tobacco Use: Low Risk  (10/01/2023)    Readmission Risk Interventions     No data to display

## 2023-10-06 NOTE — Progress Notes (Signed)
 Called and gave report to [facility], spoke with Dedra.

## 2023-10-11 NOTE — Telephone Encounter (Signed)
 Patient is currently at [facility].  They will call to schedule her CT. Appt fu with us  is on 10/27/23

## 2023-10-12 ENCOUNTER — Encounter: Payer: Self-pay | Admitting: Orthopedic Surgery

## 2023-10-15 NOTE — Telephone Encounter (Signed)
 CT 10/19/2023

## 2023-10-19 ENCOUNTER — Ambulatory Visit
Admission: RE | Admit: 2023-10-19 | Discharge: 2023-10-19 | Disposition: A | Discharge status: Home / Self Care | Source: Ambulatory Visit | Attending: Neurosurgery | Admitting: Neurosurgery

## 2023-10-19 DIAGNOSIS — S065XAA Traumatic subdural hemorrhage with loss of consciousness status unknown, initial encounter: Secondary | ICD-10-CM | POA: Insufficient documentation

## 2023-10-26 NOTE — Progress Notes (Unsigned)
 Referring Physician:  No referring provider defined for this encounter.  Primary Physician:  [facility]  History of Present Illness: 10/27/23 Eileen Walsh is here today with a chief complaint of subdural hematoma.  This was found after patient suffered a polytrauma, she lost her balance and fell while trying to sit in her chair on 10/01/2023.  She states she has had some dizziness, no headaches.  Denies any new changes to vision.  No new weakness that she has noticed.  No other neurological changes.  Review of Systems:  A 10 point review of systems is negative, except for the pertinent positives and negatives detailed in the HPI.  Past Medical History: Past Medical History:  Diagnosis Date   Arthritis    COPD (chronic obstructive pulmonary disease) (HCC)    Fibromyalgia    Hypertension    Macular degeneration     Past Surgical History: Past Surgical History:  Procedure Laterality Date   ABDOMINAL HYSTERECTOMY  1970   BREAST SURGERY  1985   Breast Bx   CHOLECYSTECTOMY     HIP PINNING,CANNULATED Right 10/01/2023   Procedure: RIGHT HIP PERCUTANEOUS PINNING;  Surgeon: Lorri Rota, MD;  Location: [facility] ORS;  Service: Orthopedics;  Laterality: Right;    Allergies: Allergies as of 10/27/2023 - Review Complete 10/27/2023  Allergen Reaction Noted   Aspirin  Other (See Comments) 11/05/2014   Sulfa antibiotics Hives and Other (See Comments) 11/05/2014    Medications: Outpatient Encounter Medications as of 10/27/2023  Medication Sig   acetaminophen  (TYLENOL ) 500 MG tablet Take 1,000 mg by mouth every 8 (eight) hours as needed for mild pain (pain score 1-3) or moderate pain (pain score 4-6).   amLODipine -olmesartan  (AZOR ) 10-40 MG tablet Take 1 tablet by mouth daily.   calcium  carbonate (TUMS - DOSED IN MG ELEMENTAL CALCIUM ) 500 MG chewable tablet Chew 2 tablets by mouth every 4 (four) hours as needed for indigestion or heartburn.   clobetasol  cream (TEMOVATE )  0.05 % Apply 1 Application topically 2 (two) times daily.   DULoxetine  (CYMBALTA ) 30 MG capsule Take 1 capsule (30 mg total) by mouth daily.   enoxaparin  (LOVENOX ) 40 MG/0.4ML injection Inject 0.4 mLs (40 mg total) into the skin daily for 28 days.   LUTEIN  PO Take 1 tablet by mouth daily.   polyethylene glycol (MIRALAX ) 17 g packet Take 17 g by mouth daily.   traMADol  (ULTRAM ) 50 MG tablet Take 1 tablet (50 mg total) by mouth every 6 (six) hours as needed for moderate pain (pain score 4-6).   oxyCODONE  (OXY IR/ROXICODONE ) 5 MG immediate release tablet Take 0.5-1 tablets (2.5-5 mg total) by mouth every 4 (four) hours as needed for moderate pain (pain score 4-6) (pain score 4-6).   No facility-administered encounter medications on file as of 10/27/2023.    Social History: Social History   Tobacco Use   Smoking status: Never   Smokeless tobacco: Never  Vaping Use   Vaping status: Never Used  Substance Use Topics   Alcohol use: Not Currently    Alcohol/week: 0.0 standard drinks of alcohol   Drug use: No    Family Medical History: Family History  Problem Relation Age of Onset   Hypertension Mother    Arthritis Mother    Hyperlipidemia Mother    Heart disease Father    Hypertension Brother    Stroke Brother    Diabetes Brother    Arthritis Brother    Heart disease Brother    Anxiety disorder Brother  severe. had nervous breakdown    Physical Examination: @VITALWITHPAIN @  General: Patient is well developed, well nourished, calm, collected, and in no apparent distress. Attention to examination is appropriate.  Psychiatric: Patient is non-anxious.  Head:  Pupils equal, round, and reactive to light.  ENT:  Oral mucosa appears well hydrated.  Neck:   Supple.  Full range of motion.  Respiratory: Patient is breathing without any difficulty.  Extremities: No edema.  Vascular: Palpable dorsal pedal pulses.  Skin:   On exposed skin, there are no abnormal skin  lesions.  NEUROLOGICAL:     Awake, alert, oriented to person, place, and time.  Speech is clear and fluent. Fund of knowledge is appropriate.   Cranial Nerves: Pupils equal round and reactive to light.  Facial tone is symmetric.  Facial sensation is symmetric.  There is no pronator drift. ROM of spine: Patient nontender to palpation of her spine.  Strength: Active range of motion of all 4 extremities.  Strength on examination is to baseline.  Hoffman's is absent.  Clonus is not present.  Toes are down-going.  Bilateral upper and lower extremity sensation is intact to light touch.    Patient was seen in a wheelchair for this appointment.  Medical Decision Making  Imaging:  EXAM: CT HEAD WITHOUT CONTRAST 09/30/23   TECHNIQUE: Contiguous axial images were obtained from the base of the skull through the vertex without intravenous contrast.   RADIATION DOSE REDUCTION: This exam was performed according to the departmental dose-optimization program which includes automated exposure control, adjustment of the mA and/or kV according to patient size and/or use of iterative reconstruction technique.   COMPARISON:  CT head from earlier today.   FINDINGS: Brain: Unchanged small subdural hemorrhage along the right cerebral convexity without substantial mass effect. No midline shift. Similar patchy white matter hypodensities, compatible with chronic microvascular disease. No evidence of acute large vascular territory infarct, mass lesion, or hydrocephalus.   Vascular: Calcific atherosclerosis.  No hyperdense vessel.   Skull: No acute fracture.   Sinuses/Orbits: Clear sinuses.  No acute orbital findings.   Other: No mastoid effusions.   IMPRESSION: Unchanged small subdural hemorrhage along the right cerebral convexity without substantial mass effect.    I have personally reviewed the images and agree with the above interpretation.  Assessment and Plan: Eileen Walsh is a  pleasant 89 y.o. female is here today with a chief complaint of subdural hematoma.  This was found after patient suffered a polytrauma, she lost her balance and fell while trying to sit in her chair on 10/01/2023.  She states she has had some dizziness, no headaches.  Denies any new changes to vision.  No new weakness that she has noticed.  No other neurological changes.  On examination she was well-appearing and to baseline.  No pronator drift present.  Awaiting final read of CT head without contrast.  Pleasure to see patient in clinic today.  She does have likely chronic/subacute subdural hematoma.  Does not appear to be worsening on most recent CT, will await final read by radiology.  Red flag symptoms were reviewed at length with patient and her family.  Advised to contact us  with any changes in note to the nearest emergency department.  Counseled patient on fall risk and reducing falls.    Thank you for involving me in the care of this patient.   I spent a total of 30 minutes in both face-to-face and non-face-to-face activities for this visit on the date of this  encounter reviewing history, completing exam and physical, reviewing outside imaging, care coordination.  Ludwig Safer, PA-C Dept. of Neurosurgery

## 2023-10-27 ENCOUNTER — Ambulatory Visit (INDEPENDENT_AMBULATORY_CARE_PROVIDER_SITE_OTHER): Admitting: Physician Assistant

## 2023-10-27 ENCOUNTER — Encounter: Payer: Self-pay | Admitting: Physician Assistant

## 2023-10-27 VITALS — BP 112/72 | Ht 67.0 in | Wt 144.0 lb

## 2023-10-27 DIAGNOSIS — W19XXXD Unspecified fall, subsequent encounter: Secondary | ICD-10-CM

## 2023-10-27 DIAGNOSIS — S065XAA Traumatic subdural hemorrhage with loss of consciousness status unknown, initial encounter: Secondary | ICD-10-CM

## 2023-10-27 DIAGNOSIS — S065XAD Traumatic subdural hemorrhage with loss of consciousness status unknown, subsequent encounter: Secondary | ICD-10-CM

## 2023-11-20 ENCOUNTER — Encounter: Payer: Self-pay | Admitting: Emergency Medicine

## 2023-11-20 ENCOUNTER — Emergency Department

## 2023-11-20 ENCOUNTER — Other Ambulatory Visit: Payer: Self-pay

## 2023-11-20 ENCOUNTER — Inpatient Hospital Stay
Admission: EM | Admit: 2023-11-20 | Discharge: 2023-11-24 | DRG: 378 | Disposition: A | Discharge status: Hospice | Source: Skilled Nursing Facility | Attending: Student | Admitting: Student

## 2023-11-20 DIAGNOSIS — Z79899 Other long term (current) drug therapy: Secondary | ICD-10-CM

## 2023-11-20 DIAGNOSIS — L89152 Pressure ulcer of sacral region, stage 2: Secondary | ICD-10-CM | POA: Diagnosis present

## 2023-11-20 DIAGNOSIS — W050XXA Fall from non-moving wheelchair, initial encounter: Secondary | ICD-10-CM | POA: Diagnosis present

## 2023-11-20 DIAGNOSIS — E1122 Type 2 diabetes mellitus with diabetic chronic kidney disease: Secondary | ICD-10-CM | POA: Diagnosis present

## 2023-11-20 DIAGNOSIS — Z66 Do not resuscitate: Secondary | ICD-10-CM | POA: Diagnosis present

## 2023-11-20 DIAGNOSIS — I4891 Unspecified atrial fibrillation: Secondary | ICD-10-CM | POA: Diagnosis present

## 2023-11-20 DIAGNOSIS — Z886 Allergy status to analgesic agent status: Secondary | ICD-10-CM | POA: Diagnosis not present

## 2023-11-20 DIAGNOSIS — K922 Gastrointestinal hemorrhage, unspecified: Secondary | ICD-10-CM | POA: Diagnosis present

## 2023-11-20 DIAGNOSIS — Z515 Encounter for palliative care: Secondary | ICD-10-CM

## 2023-11-20 DIAGNOSIS — Z9049 Acquired absence of other specified parts of digestive tract: Secondary | ICD-10-CM

## 2023-11-20 DIAGNOSIS — Z90710 Acquired absence of both cervix and uterus: Secondary | ICD-10-CM | POA: Diagnosis not present

## 2023-11-20 DIAGNOSIS — W19XXXA Unspecified fall, initial encounter: Secondary | ICD-10-CM | POA: Diagnosis not present

## 2023-11-20 DIAGNOSIS — D62 Acute posthemorrhagic anemia: Secondary | ICD-10-CM | POA: Diagnosis present

## 2023-11-20 DIAGNOSIS — Z789 Other specified health status: Secondary | ICD-10-CM | POA: Diagnosis not present

## 2023-11-20 DIAGNOSIS — M797 Fibromyalgia: Secondary | ICD-10-CM | POA: Diagnosis present

## 2023-11-20 DIAGNOSIS — Z7901 Long term (current) use of anticoagulants: Secondary | ICD-10-CM | POA: Diagnosis not present

## 2023-11-20 DIAGNOSIS — K921 Melena: Secondary | ICD-10-CM | POA: Diagnosis present

## 2023-11-20 DIAGNOSIS — Z83438 Family history of other disorder of lipoprotein metabolism and other lipidemia: Secondary | ICD-10-CM

## 2023-11-20 DIAGNOSIS — R000 Tachycardia, unspecified: Secondary | ICD-10-CM | POA: Diagnosis not present

## 2023-11-20 DIAGNOSIS — N179 Acute kidney failure, unspecified: Secondary | ICD-10-CM | POA: Diagnosis present

## 2023-11-20 DIAGNOSIS — Y92009 Unspecified place in unspecified non-institutional (private) residence as the place of occurrence of the external cause: Secondary | ICD-10-CM | POA: Diagnosis not present

## 2023-11-20 DIAGNOSIS — I480 Paroxysmal atrial fibrillation: Secondary | ICD-10-CM | POA: Diagnosis present

## 2023-11-20 DIAGNOSIS — C7801 Secondary malignant neoplasm of right lung: Secondary | ICD-10-CM | POA: Diagnosis present

## 2023-11-20 DIAGNOSIS — I2510 Atherosclerotic heart disease of native coronary artery without angina pectoris: Secondary | ICD-10-CM | POA: Diagnosis present

## 2023-11-20 DIAGNOSIS — Z823 Family history of stroke: Secondary | ICD-10-CM

## 2023-11-20 DIAGNOSIS — Z818 Family history of other mental and behavioral disorders: Secondary | ICD-10-CM

## 2023-11-20 DIAGNOSIS — R296 Repeated falls: Secondary | ICD-10-CM | POA: Diagnosis present

## 2023-11-20 DIAGNOSIS — R634 Abnormal weight loss: Secondary | ICD-10-CM | POA: Diagnosis present

## 2023-11-20 DIAGNOSIS — L8990 Pressure ulcer of unspecified site, unspecified stage: Secondary | ICD-10-CM | POA: Diagnosis present

## 2023-11-20 DIAGNOSIS — Z7189 Other specified counseling: Secondary | ICD-10-CM | POA: Diagnosis not present

## 2023-11-20 DIAGNOSIS — R627 Adult failure to thrive: Secondary | ICD-10-CM | POA: Diagnosis present

## 2023-11-20 DIAGNOSIS — N1831 Chronic kidney disease, stage 3a: Secondary | ICD-10-CM | POA: Diagnosis present

## 2023-11-20 DIAGNOSIS — F0390 Unspecified dementia without behavioral disturbance: Secondary | ICD-10-CM | POA: Diagnosis present

## 2023-11-20 DIAGNOSIS — J449 Chronic obstructive pulmonary disease, unspecified: Secondary | ICD-10-CM | POA: Diagnosis present

## 2023-11-20 DIAGNOSIS — Z882 Allergy status to sulfonamides status: Secondary | ICD-10-CM | POA: Diagnosis not present

## 2023-11-20 DIAGNOSIS — I10 Essential (primary) hypertension: Secondary | ICD-10-CM | POA: Diagnosis present

## 2023-11-20 DIAGNOSIS — I129 Hypertensive chronic kidney disease with stage 1 through stage 4 chronic kidney disease, or unspecified chronic kidney disease: Secondary | ICD-10-CM | POA: Diagnosis present

## 2023-11-20 DIAGNOSIS — R918 Other nonspecific abnormal finding of lung field: Secondary | ICD-10-CM | POA: Diagnosis present

## 2023-11-20 DIAGNOSIS — F419 Anxiety disorder, unspecified: Secondary | ICD-10-CM | POA: Diagnosis present

## 2023-11-20 DIAGNOSIS — Z8261 Family history of arthritis: Secondary | ICD-10-CM

## 2023-11-20 DIAGNOSIS — K5730 Diverticulosis of large intestine without perforation or abscess without bleeding: Secondary | ICD-10-CM | POA: Diagnosis present

## 2023-11-20 DIAGNOSIS — Z8249 Family history of ischemic heart disease and other diseases of the circulatory system: Secondary | ICD-10-CM | POA: Diagnosis not present

## 2023-11-20 DIAGNOSIS — Z833 Family history of diabetes mellitus: Secondary | ICD-10-CM | POA: Diagnosis not present

## 2023-11-20 DIAGNOSIS — R531 Weakness: Secondary | ICD-10-CM

## 2023-11-20 LAB — CBC
HCT: 29 % — ABNORMAL LOW (ref 36.0–46.0)
Hemoglobin: 9.5 g/dL — ABNORMAL LOW (ref 12.0–15.0)
MCH: 30 pg (ref 26.0–34.0)
MCHC: 32.8 g/dL (ref 30.0–36.0)
MCV: 91.5 fL (ref 80.0–100.0)
Platelets: 442 10*3/uL — ABNORMAL HIGH (ref 150–400)
RBC: 3.17 MIL/uL — ABNORMAL LOW (ref 3.87–5.11)
RDW: 14.1 % (ref 11.5–15.5)
WBC: 23.2 10*3/uL — ABNORMAL HIGH (ref 4.0–10.5)
nRBC: 0 % (ref 0.0–0.2)

## 2023-11-20 LAB — TYPE AND SCREEN
ABO/RH(D): O POS
Antibody Screen: NEGATIVE

## 2023-11-20 LAB — COMPREHENSIVE METABOLIC PANEL WITH GFR
ALT: 28 U/L (ref 0–44)
AST: 36 U/L (ref 15–41)
Albumin: 3.1 g/dL — ABNORMAL LOW (ref 3.5–5.0)
Alkaline Phosphatase: 62 U/L (ref 38–126)
Anion gap: 16 — ABNORMAL HIGH (ref 5–15)
BUN: 79 mg/dL — ABNORMAL HIGH (ref 8–23)
CO2: 13 mmol/L — ABNORMAL LOW (ref 22–32)
Calcium: 9.6 mg/dL (ref 8.9–10.3)
Chloride: 102 mmol/L (ref 98–111)
Creatinine, Ser: 1.74 mg/dL — ABNORMAL HIGH (ref 0.44–1.00)
GFR, Estimated: 27 mL/min — ABNORMAL LOW (ref >60)
Glucose, Bld: 197 mg/dL — ABNORMAL HIGH (ref 70–99)
Potassium: 4.6 mmol/L (ref 3.5–5.1)
Sodium: 131 mmol/L — ABNORMAL LOW (ref 135–145)
Total Bilirubin: 0.9 mg/dL (ref 0.0–1.2)
Total Protein: 6.3 g/dL — ABNORMAL LOW (ref 6.5–8.1)

## 2023-11-20 LAB — PROTIME-INR
INR: 2.5 — ABNORMAL HIGH (ref 0.8–1.2)
Prothrombin Time: 27.3 s — ABNORMAL HIGH (ref 11.4–15.2)

## 2023-11-20 LAB — MRSA NEXT GEN BY PCR, NASAL: MRSA by PCR Next Gen: NOT DETECTED

## 2023-11-20 LAB — APTT: aPTT: 45 s — ABNORMAL HIGH (ref 24–36)

## 2023-11-20 MED ORDER — MORPHINE SULFATE (PF) 2 MG/ML IV SOLN
2.0000 mg | INTRAVENOUS | Status: DC | PRN
  Administered 2023-11-20: 4 mg via INTRAVENOUS
  Administered 2023-11-21 – 2023-11-24 (×2): 2 mg via INTRAVENOUS
  Filled 2023-11-20: qty 2
  Filled 2023-11-20 (×2): qty 1

## 2023-11-20 MED ORDER — PROMETHAZINE HCL 25 MG PO TABS: 12.5000 mg | ORAL_TABLET | Freq: Four times a day (QID) | ORAL | Status: DC | PRN

## 2023-11-20 MED ORDER — GLYCOPYRROLATE 0.2 MG/ML IJ SOLN: 0.2000 mg | INTRAMUSCULAR | Status: DC | PRN

## 2023-11-20 MED ORDER — LORAZEPAM 2 MG/ML IJ SOLN: 2.0000 mg | INTRAMUSCULAR | Status: DC | PRN

## 2023-11-20 MED ORDER — SODIUM CHLORIDE 0.9 % IV SOLN
2.0000 g | Freq: Once | INTRAVENOUS | Status: AC
  Administered 2023-11-20: 2 g via INTRAVENOUS
  Filled 2023-11-20: qty 20

## 2023-11-20 MED ORDER — DOXYCYCLINE HYCLATE 100 MG PO TABS
100.0000 mg | ORAL_TABLET | Freq: Once | ORAL | Status: AC
  Administered 2023-11-20: 100 mg via ORAL
  Filled 2023-11-20: qty 1

## 2023-11-20 MED ORDER — ACETAMINOPHEN 325 MG PO TABS: 650.0000 mg | ORAL_TABLET | Freq: Four times a day (QID) | ORAL | Status: DC | PRN

## 2023-11-20 MED ORDER — GLYCOPYRROLATE 1 MG PO TABS: 1.0000 mg | ORAL_TABLET | ORAL | Status: DC | PRN

## 2023-11-20 MED ORDER — PANTOPRAZOLE SODIUM 40 MG IV SOLR
40.0000 mg | Freq: Once | INTRAVENOUS | Status: AC
  Administered 2023-11-20: 40 mg via INTRAVENOUS
  Filled 2023-11-20: qty 10

## 2023-11-20 MED ORDER — ACETAMINOPHEN 650 MG RE SUPP: 650.0000 mg | Freq: Four times a day (QID) | RECTAL | Status: DC | PRN

## 2023-11-20 MED ORDER — DIPHENHYDRAMINE HCL 50 MG/ML IJ SOLN: 25.0000 mg | INTRAMUSCULAR | Status: DC | PRN

## 2023-11-20 MED ORDER — POLYVINYL ALCOHOL 1.4 % OP SOLN: 1.0000 [drp] | Freq: Four times a day (QID) | OPHTHALMIC | Status: DC | PRN

## 2023-11-20 NOTE — Assessment & Plan Note (Signed)
 The patient is too weak to remain seated in a wheelchair by herself. She cannot remain in the assisted living facility. She is not safe.

## 2023-11-20 NOTE — Assessment & Plan Note (Signed)
 The patient is normotensive today. She will not be continued on her antihypertensives.

## 2023-11-20 NOTE — ED Triage Notes (Signed)
 First Nurse Note:  Pt via [facility] from [place]. Pt here fell out the wheelchair, pt c/o abd pain. Head injury. Denies blood thinner. States she did feel dizzy before her fall. When asked where she is hurting pt continues to say "R side" but unable to tell pt where specifically. Pt is at her baseline with hx of dementia 146/60 BP 194 CBG 99% on RA 87 HR

## 2023-11-20 NOTE — Assessment & Plan Note (Signed)
 Noted

## 2023-11-20 NOTE — ED Provider Notes (Signed)
 [facility] Provider Note    Event Date/Time   First MD Initiated Contact with Patient 11/20/23 1410     (approximate)   History   Fall   HPI  Eileen Walsh is a 89 y.o. female with dementia, prior subdural and hip fracture back on 09/30/2023 who comes in with concerns for recurrent fall.  Patient presents with the family member, POA who states that patient does have dementia and that she was told that patient slipped out of her wheelchair.  However yesterday they noted that patient looked more pale in nature and that she has been complaining of black stools.  They deny any prior history of this.  Physical Exam   Triage Vital Signs: ED Triage Vitals [11/20/23 1217]  Encounter Vitals Group     BP (!) 106/52     Systolic BP Percentile      Diastolic BP Percentile      Pulse Rate 87     Resp 18     Temp 98.1 F (36.7 C)     Temp Source Oral     SpO2 100 %     Weight      Height      Head Circumference      Peak Flow      Pain Score      Pain Loc      Pain Education      Exclude from Growth Chart     Most recent vital signs: Vitals:   11/20/23 1217  BP: (!) 106/52  Pulse: 87  Resp: 18  Temp: 98.1 F (36.7 C)  SpO2: 100%     General: Awake, no distress.  CV:  Good peripheral perfusion.  Resp:  Normal effort.  Abd:  No distention.  Soft and nontender Other:  Positive black stools   ED Results / Procedures / Treatments   Labs (all labs ordered are listed, but only abnormal results are displayed) Labs Reviewed  COMPREHENSIVE METABOLIC PANEL WITH GFR - Abnormal; Notable for the following components:      Result Value   Sodium 131 (*)    CO2 13 (*)    Glucose, Bld 197 (*)    BUN 79 (*)    Creatinine, Ser 1.74 (*)    Total Protein 6.3 (*)    Albumin 3.1 (*)    GFR, Estimated 27 (*)    Anion gap 16 (*)    All other components within normal limits  CBC - Abnormal; Notable for the following components:   WBC 23.2 (*)    RBC  3.17 (*)    Hemoglobin 9.5 (*)    HCT 29.0 (*)    Platelets 442 (*)    All other components within normal limits  URINALYSIS, ROUTINE W REFLEX MICROSCOPIC  PROTIME-INR  APTT  CBG MONITORING, ED  TYPE AND SCREEN     EKG  My interpretation of EKG:  Normal sinus rhythm 87 without any ST elevation or T wave inversions, normal intervals  RADIOLOGY I have reviewed the ct head personally and interpreted no evidence of intracranial hemorrhage   PROCEDURES:  Critical Care performed: No  Procedures   MEDICATIONS ORDERED IN ED: Medications  pantoprazole  (PROTONIX ) injection 40 mg (40 mg Intravenous Given 11/20/23 1541)     IMPRESSION / MDM / ASSESSMENT AND PLAN / ED COURSE  I reviewed the triage vital signs and the nursing notes.   Patient's presentation is most consistent with acute presentation with potential threat  to life or bodily function.   Patient comes in with concerns for a fall she is a poor historian.  Patient noted to have lower blood levels on CBC I will get pan CT imaging to make sure no evidence of bleeding into her abdomen chest or head.  However I suspect the low blood levels are probably more likely from her melena and GI bleed.  CBC drop in hemoglobin to 9.5.  White count elevated.  CMP shows elevated creatinine.  Coags reassuring patient has elevated white count but does not meet sepsis criteria.  No other infectious symptoms.  There is some inflammation in her lung that is concerning for possible cancer which I did discuss with family and they are aware.  They stated that they noted this before and were placed on antibiotics for it.  We discussed patient's goals of care she has dementia she has had difficulty making all medical decisions therefore there are 2 family members both the daughter and brother help make medical decisions.  They are thinking that they would just want comfort care they do not want endoscopy we discussed transition to potentially hospice  and they are open to considering this.  At this time we are going to hold off on any blood cultures given patient has no sepsis criteria other than the elevated white count we could trial some antibiotics but family continue to make decisions about goals of care.  I will discuss with hospital team for admission for upper GI bleed, elevated white count  The patient is on the cardiac monitor to evaluate for evidence of arrhythmia and/or significant heart rate changes.      FINAL CLINICAL IMPRESSION(S) / ED DIAGNOSES   Final diagnoses:  Fall, initial encounter  Gastrointestinal hemorrhage, unspecified gastrointestinal hemorrhage type     Rx / DC Orders   ED Discharge Orders     None        Note:  This document was prepared using Dragon voice recognition software and may include unintentional dictation errors.   Lubertha Rush, MD 11/20/23 [phone]

## 2023-11-20 NOTE — Assessment & Plan Note (Signed)
 The patient's hemoglobin has dropped from 12.4 on 10/05/2023 to 9.5 today. Her daughter who accompanies her states that from day to day the patient appears more drawn and pale. Today the patient is notably pale. Her daughter states that yesterday she appeared gray. The patient remarked yesterday that her stools were black to her daughter. Today the patient's daughter observed that the patient's stool was black.

## 2023-11-20 NOTE — Assessment & Plan Note (Signed)
 The patient was left alone in her wheelchair for moments today. She fell out of the wheelchair. The patient has had multiple falls recently and actually requires closer supervision than can be provided at the assisted living.

## 2023-11-20 NOTE — Assessment & Plan Note (Signed)
 Noted. The patient has had declining functionality and has been non-ambulatory since her hip fracture. She is often confused. Per her daughter she has no quality of life.

## 2023-11-20 NOTE — H&P (Signed)
 History and Physical    Patient: Eileen Walsh ION:[id number] DOB: Jan 07, 1929 DOA: 11/20/2023 DOS: the patient was seen and examined on 11/20/2023 PCP: [facility]  Patient coming from: ALF/ILF  Chief Complaint:  Chief Complaint  Patient presents with   Fall   HPI: Eileen Walsh is a 89 y.o. female with medical history significant of dementia, arthritis, recent hip fracture, non-ambulatory, fibromyalgia, heart disease, hypertension, diabetes, anxiety, and failure to thrive.  The patient has recently had a hip fracture which, although repaired, keeps her from being able to walk. She now resides in an assisted living facility. This morning she was left in her chair alone for a few moments. She fell out of her chair being too weak to remin in it. Also on 11/19/2023 the patient complained to her daughter that she had been passing black stools. The daughter did not take her seriously until she saw her stool herself today. It is black. In the ED the patient had a CT chest abdomen and pelvis was performed in the ED. It again demonstrated the presence of  a subpleural ground-glass and subsolid lesion measuring 4.0 x 3.6 cm (series 4, image 55). Similar irregular ground-glass and consolidation inferiorly in the dependent right lower lobe (series 4, image 69). New satellite lesions in the azygoesophageal recess of the right lower lobe measuring 0.6 cm (series 4, image 62) and 1.7 x 1.2 cm (series 4, image 78). This in part had previously been seen on a CT performed by pulmonology at a hospital in [place]. The daughter had been told that it would be treated as a pneumonia, but that it should be evaluated on CT later in the future as it may actually represent a lung mass which it now clearly is.    The family does not wish for any invasive treatment. The daughter wants only for the patient to be comfortable. She spoke with someone in palliative care at [facility] on 11/19/2023 who suggested comfort  care. That is what the daughter wants now.  Review of Systems: unable to review all systems due to the inability of the patient to answer questions. Past Medical History:  Diagnosis Date   Arthritis    COPD (chronic obstructive pulmonary disease) (HCC)    Fibromyalgia    Hypertension    Macular degeneration    Past Surgical History:  Procedure Laterality Date   ABDOMINAL HYSTERECTOMY  1970   BREAST SURGERY  1985   Breast Bx   CHOLECYSTECTOMY     HIP PINNING,CANNULATED Right 10/01/2023   Procedure: RIGHT HIP PERCUTANEOUS PINNING;  Surgeon: Lorri Rota, MD;  Location: [facility] ORS;  Service: Orthopedics;  Laterality: Right;   Social History:  reports that she has never smoked. She has never used smokeless tobacco. She reports that she does not currently use alcohol. She reports that she does not use drugs.  Allergies  Allergen Reactions   Aspirin  Other (See Comments)   Sulfa Antibiotics Hives and Other (See Comments)    Family History  Problem Relation Age of Onset   Hypertension Mother    Arthritis Mother    Hyperlipidemia Mother    Heart disease Father    Hypertension Brother    Stroke Brother    Diabetes Brother    Arthritis Brother    Heart disease Brother    Anxiety disorder Brother        severe. had nervous breakdown    Prior to Admission medications   Medication Sig Start Date End Date  Taking? Authorizing Provider  acetaminophen  (TYLENOL ) 500 MG tablet Take 1,000 mg by mouth every 8 (eight) hours as needed for mild pain (pain score 1-3) or moderate pain (pain score 4-6).   Yes [provider]  amLODipine -olmesartan  (AZOR ) 10-40 MG tablet Take 1 tablet by mouth daily. 09/23/23  Yes [provider]  calcium  carbonate (TUMS - DOSED IN MG ELEMENTAL CALCIUM ) 500 MG chewable tablet Chew 2 tablets by mouth every 4 (four) hours as needed for indigestion or heartburn.   Yes [provider]  clobetasol  cream (TEMOVATE ) 0.05 % Apply 1 Application  topically 2 (two) times daily.   Yes [provider]  DULoxetine  (CYMBALTA ) 60 MG capsule Take 60 mg by mouth at bedtime. 10/08/23  Yes [provider]  ELIQUIS 5 MG TABS tablet Take 5 mg by mouth 2 (two) times daily. 11/05/23  Yes [provider]  Omeprazole  20 MG TBEC Take 1 tablet by mouth daily. 11/06/23  Yes [provider]  polyethylene glycol (MIRALAX ) 17 g packet Take 17 g by mouth daily. 10/06/23  Yes Ree Candy, MD  traMADol  (ULTRAM ) 50 MG tablet Take 1 tablet (50 mg total) by mouth every 6 (six) hours as needed for moderate pain (pain score 4-6). 10/04/23  Yes Bert Britain, PA-C  DULoxetine  (CYMBALTA ) 30 MG capsule Take 1 capsule (30 mg total) by mouth daily. Patient not taking: Reported on 11/20/2023 02/04/22   Nikki Barters, MD  enoxaparin  (LOVENOX ) 40 MG/0.4ML injection Inject 0.4 mLs (40 mg total) into the skin daily for 28 days. Patient not taking: Reported on 11/20/2023 10/04/23 11/01/23  Bert Britain, PA-C  LUTEIN  PO Take 1 tablet by mouth daily. Patient not taking: Reported on 11/20/2023    [provider]  oxyCODONE  (OXY IR/ROXICODONE ) 5 MG immediate release tablet Take 0.5-1 tablets (2.5-5 mg total) by mouth every 4 (four) hours as needed for moderate pain (pain score 4-6) (pain score 4-6). Patient not taking: Reported on 11/20/2023 10/04/23   Bert Britain, PA-C    Physical Exam: Vitals:   11/20/23 1217 11/20/23 1806  BP: (!) 106/52 130/61  Pulse: 87 85  Resp: 18 16  Temp: 98.1 F (36.7 C) 98 F (36.7 C)  TempSrc: Oral   SpO2: 100% 98%   Exam:  Constitutional:  The patient is awake, alert, and oriented x 2. She is at times confused and  Eyes:  pupils and irises appear normal Normal lids and conjunctivae ENMT:  grossly normal hearing  Lips appear normal external ears, nose appear normal Oropharynx: mucosa, tongue,posterior pharynx appear normal Neck:  neck appears normal, no masses, normal ROM, supple no  thyromegaly Respiratory:  No increased work of breathing. No wheezes, rales, or rhonchi No tactile fremitus Cardiovascular:  Regular rate and rhythm No murmurs, ectopy, or gallups. No lateral PMI. No thrills. Abdomen:  Abdomen is soft, non-tender, non-distended No hernias, masses, or organomegaly Normoactive bowel sounds.  Musculoskeletal:  No cyanosis, clubbing, or edema Skin:  No rashes, lesions, ulcers palpation of skin: no induration or nodules Neurologic:  CN 2-12 intact Sensation all 4 extremities intact Psychiatric:  Mental status Mood, affect appropriate Orientation to person, place, time  judgment and insight appear intact  Data Reviewed:  CBC BMP CT chest abdomen and pelvis EKG  Assessment and Plan: Acute post-hemorrhagic anemia The patient's hemoglobin has dropped from 12.4 on 10/05/2023 to 9.5 today. Her daughter who accompanies her states that from day to day the patient appears more drawn and pale. Today the  patient is notably pale. Her daughter states that yesterday she appeared gray. The patient remarked yesterday that her stools were black to her daughter. Today the patient's daughter observed that the patient's stool was black.   Fall at home, initial encounter The patient was left alone in her wheelchair for moments today. She fell out of the wheelchair. The patient has had multiple falls recently and actually requires closer supervision than can be provided at the assisted living.  Failure to thrive in adult According to the patient's daughter who is at bedside, the patient will barely eat or drink anything. She shows me a picture of the patient taken 2 months ago. She has clearly lost weight. Palliative care has spoken with the daughter who states that she just wants her mother to be comfortable.  The patient has been made comfort care and palliative care has been consulted.   Lower GI bleed The patient has had melanotic stools for at least 2 days.  The patient's family does not wish for the patient to have any endoscopy of invasive work up.   Generalized weakness The patient is too weak to remain seated in a wheelchair by herself. She cannot remain in the assisted living facility. She is not safe.   Essential (primary) hypertension The patient is normotensive today. She will not be continued on her antihypertensives.  Dementia without behavioral disturbance (HCC) Noted. The patient has had declining functionality and has been non-ambulatory since her hip fracture. She is often confused. Per her daughter she has no quality of life.   AF (paroxysmal atrial fibrillation) (HCC) The patient is in sinus rhythm at this time. Her eliquis will not be continued due to complaints of melena.  Chronic kidney disease, stage 3a (HCC) Noted. Baseline creatinine from recent chemistries has been 0.83- 1.0. Today her creatinine is 1.74 representing AKI on CKD III.  Pressure injury of skin Noted.  Lung mass CT chest abdomen and pelvis demonstrated:   a subpleural ground-glass and subsolid lesion measuring 4.0 x 3.6 cm (series 4, image 55). Similar irregular ground-glass and consolidation inferiorly in the dependent right lower lobe (series 4, image 69). New satellite lesions in the azygoesophageal recess of the right lower lobe measuring 0.6 cm (series 4, image 62) and 1.7 x 1.2 cm (series 4, image 78). A similar mass was seen on CT performed in [place] some time ago. It was treated by pulmonology at that time as a pneumonia, but the daughter was informed at that time that the patient would need imaging again in the future to determine if it had resolved or if it was a mass. It is clearly a mass.     Advance Care Planning:   Code Status: Do not attempt resuscitation (DNR) - Comfort care   Consults: Palliative care  Family Communication: Daughter at bedside.  Severity of Illness: The appropriate patient status for this patient is  INPATIENT. Inpatient status is judged to be reasonable and necessary in order to provide the required intensity of service to ensure the patient's safety. The patient's presenting symptoms, physical exam findings, and initial radiographic and laboratory data in the context of their chronic comorbidities is felt to place them at high risk for further clinical deterioration. Furthermore, it is not anticipated that the patient will be medically stable for discharge from the hospital within 2 midnights of admission.   * I certify that at the point of admission it is my clinical judgment that the patient will require inpatient hospital care  spanning beyond 2 midnights from the point of admission due to high intensity of service, high risk for further deterioration and high frequency of surveillance required.*  Author: Jillyn Stacey, DO 11/20/2023 6:30 PM  For on call review [email or web address].

## 2023-11-20 NOTE — Assessment & Plan Note (Addendum)
 According to the patient's daughter who is at bedside, the patient will barely eat or drink anything. She shows me a picture of the patient taken 2 months ago. She has clearly lost weight. Palliative care has spoken with the daughter who states that she just wants her mother to be comfortable.  The patient has been made comfort care and palliative care has been consulted.

## 2023-11-20 NOTE — Assessment & Plan Note (Signed)
 Noted. Baseline creatinine from recent chemistries has been 0.83- 1.0. Today her creatinine is 1.74 representing AKI on CKD III.

## 2023-11-20 NOTE — ED Notes (Signed)
 Advised nurse that patient has ready bed

## 2023-11-20 NOTE — Assessment & Plan Note (Signed)
 The patient has had melanotic stools for at least 2 days. The patient's family does not wish for the patient to have any endoscopy of invasive work up.

## 2023-11-20 NOTE — Assessment & Plan Note (Addendum)
 The patient is in sinus rhythm at this time. Her eliquis will not be continued due to complaints of melena.

## 2023-11-20 NOTE — Assessment & Plan Note (Signed)
 CT chest abdomen and pelvis demonstrated:   a subpleural ground-glass and subsolid lesion measuring 4.0 x 3.6 cm (series 4, image 55). Similar irregular ground-glass and consolidation inferiorly in the dependent right lower lobe (series 4, image 69). New satellite lesions in the azygoesophageal recess of the right lower lobe measuring 0.6 cm (series 4, image 62) and 1.7 x 1.2 cm (series 4, image 78). A similar mass was seen on CT performed in [place] some time ago. It was treated by pulmonology at that time as a pneumonia, but the daughter was informed at that time that the patient would need imaging again in the future to determine if it had resolved or if it was a mass. It is clearly a mass.

## 2023-11-21 DIAGNOSIS — W19XXXA Unspecified fall, initial encounter: Secondary | ICD-10-CM | POA: Diagnosis not present

## 2023-11-21 DIAGNOSIS — Z7189 Other specified counseling: Secondary | ICD-10-CM | POA: Diagnosis not present

## 2023-11-21 DIAGNOSIS — Z789 Other specified health status: Secondary | ICD-10-CM | POA: Diagnosis not present

## 2023-11-21 DIAGNOSIS — Z66 Do not resuscitate: Secondary | ICD-10-CM

## 2023-11-21 DIAGNOSIS — Z515 Encounter for palliative care: Secondary | ICD-10-CM

## 2023-11-21 DIAGNOSIS — K922 Gastrointestinal hemorrhage, unspecified: Secondary | ICD-10-CM | POA: Diagnosis not present

## 2023-11-21 NOTE — TOC Initial Note (Signed)
 Transition of Care [facility]) - Initial/Assessment Note    Patient Details  Name: Eileen Walsh MRN: [id number] Date of Birth: 14-Nov-1928  [facility]) CM/SW Contact:    Alexandra Ice, RN Phone Number: 11/21/2023, 3:02 PM  Clinical Narrative:                 Patient resides at [facility]. Family is requesting hospice consult for IPU. TOC will continue to monitor for discharge needs.        Patient Goals and CMS Choice            Expected Discharge Plan and Services                                              Prior Living Arrangements/Services                       Activities of Daily Living   ADL Screening (condition at time of admission) Independently performs ADLs?: No Does the patient have a NEW difficulty with bathing/dressing/toileting/self-feeding that is expected to last >3 days?: Yes (Initiates electronic notice to provider for possible OT consult) Does the patient have a NEW difficulty with getting in/out of bed, walking, or climbing stairs that is expected to last >3 days?: Yes (Initiates electronic notice to provider for possible PT consult) Does the patient have a NEW difficulty with communication that is expected to last >3 days?: Yes (Initiates electronic notice to provider for possible SLP consult) Is the patient deaf or have difficulty hearing?: Yes Does the patient have difficulty seeing, even when wearing glasses/contacts?: Yes Does the patient have difficulty concentrating, remembering, or making decisions?: Yes  Permission Sought/Granted                  Emotional Assessment              Admission diagnosis:  Lower GI bleed [K92.2] Fall, initial encounter [W19.XXXA] Gastrointestinal hemorrhage, unspecified gastrointestinal hemorrhage type [K92.2] Patient Active Problem List   Diagnosis Date Noted   Lower GI bleed 11/20/2023   Acute post-hemorrhagic anemia 11/20/2023   Failure to thrive in  adult 11/20/2023   Dementia without behavioral disturbance (HCC) 11/20/2023   Lung mass 11/20/2023   Pressure injury of skin 11/20/2023   Malnutrition of moderate degree 10/04/2023   Hip fracture (HCC) 10/01/2023   Closed right hip fracture (HCC) 09/30/2023   Fall at home, initial encounter 09/30/2023   Chronic diastolic CHF (congestive heart failure) (HCC) 09/30/2023   Chronic kidney disease, stage 3a (HCC) 09/30/2023   UTI (urinary tract infection) 12/07/2021   Subdural hematoma (HCC) 11/27/2021   SDH (subdural hematoma) (HCC) 11/26/2021   Hypertensive urgency 11/26/2021   Syncope 11/26/2021   Generalized weakness 07/02/2021   Vertigo 07/02/2021   AF (paroxysmal atrial fibrillation) (HCC) 07/02/2021   Essential hypertension 01/12/2019   Choledocholithiasis 01/12/2019   Atrial fibrillation with RVR (HCC) 07/16/2016   Arthritis 11/05/2014   Back ache 11/05/2014   Clinical depression 11/05/2014   Diabetes (HCC) 11/05/2014   Fibrositis 11/05/2014   Acid reflux 11/05/2014   Blood glucose elevated 11/05/2014   HLD (hyperlipidemia) 11/05/2014   BP (high blood pressure) 11/05/2014   Neuropathy 11/05/2014   Adiposity 11/05/2014   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   Arteriosclerosis of coronary artery 04/06/2014   Essential (primary) hypertension  04/06/2014   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/2014   CAD (coronary artery disease) 04/06/2014   PCP:  [facility]:   [facility] - [place], [place] - [address] [place] [address] Phone: [phone] Fax: [phone]     Social Drivers of Health (SDOH) Social History: SDOH Screenings   Food Insecurity: No Food Insecurity (11/20/2023)  Housing: Low Risk  (11/20/2023)  Transportation Needs: No Transportation Needs (11/20/2023)  Utilities: Not At Risk (11/20/2023)  Alcohol Screen: Low Risk  (07/15/2021)  Depression  (PHQ2-9): Low Risk  (07/15/2021)  Financial Resource Strain: Low Risk  (08/31/2023)   Received from [facility]  Physical Activity: Inactive (10/09/2019)  Social Connections: Unknown (11/20/2023)  Stress: No Stress Concern Present (10/09/2019)  Tobacco Use: Low Risk  (11/20/2023)   SDOH Interventions:     Readmission Risk Interventions     No data to display

## 2023-11-21 NOTE — Progress Notes (Incomplete)
 PENDING ASSESSMENT visit for IPU  [facility] LIAISON NOTE  Received request from XXX, Transitions of Care Manager, for hospice services at home after discharge. Spoke with XXX to initiate education related to hospice philosophy, services, and team approach to care. Patient/family verbalized understanding of information given. Per discussion, the plan is for discharge home by EMS or private vehicle on XXX  DME needs discussed.   Patient has the following equipment in the home:    Patient/family requests the following equipment for delivery:                   The address has been verified and is correct in the chart.  XXX  is the family contact to arrange time of equipment delivery.  [facility] RN to call report to the Hospice Home at [phone]  This RN will arrange EMS transportation when the hospital team is ready for discharge.  Please medicate the patient prior to EMS transport as needed for comfort during transport.  Please send signed and completed DNR home with patient/family if applicable.   Please provide prescriptions at discharge as needed to ensure ongoing symptom management.   [facility] information and contact numbers given to      Above information shared with XXX, Transitions of Care Manager and hospital medical care team.     Please call with any hospice related questions or concerns.  Thank you for the opportunity to participate in this patient's care.     Ambrosio Junker, MA, BSN, RN, FNE  Nurse Liaison  [phone]

## 2023-11-21 NOTE — Plan of Care (Signed)
   Problem: Education: Goal: Knowledge of General Education information will improve Description: Including pain rating scale, medication(s)/side effects and non-pharmacologic comfort measures Outcome: Not Progressing

## 2023-11-21 NOTE — Progress Notes (Signed)
 PROGRESS NOTE    Eileen Walsh  ZOX:[id number] DOB: [birth year] DOA: 11/20/2023 PCP: Cole Daubs, [facility] course:  89 year old female with advanced dementia now nonambulatory s/p recent hip fracture, CAD, HTN, DM 2, FTT was admitted yesterday with weakness and black stools.  Workup revealed new pulmonary nodules.  Family does not wish to pursue any further invasive treatment and would like for patient to be kept comfortable.  Patient is on comfort care.  TOC is working on hospice placement.  Subjective:  Patient is awake and alert, is looking at me but will not speak, she purses her lips and I believe she is trying to stick her tongue out but I am not sure.  She appears comfortable  Exam:  General: Thin female sitting up in bed appearing comfortable Eyes: sclera anicteric, conjuctiva mild injection bilaterally CVS: S1-S2, regular  Respiratory:  decreased air entry bilaterally secondary to decreased inspiratory effor GI: Scaphoid, nontender LE: Decreased muscle mass Neuro: Advanced dementia  Assessment & Plan:   Comfort care Patient with advanced dementia, now nonambulatory s/p recent hip fracture presented with melena and found to have multiple lung mets is presently on comfort care. Patient appears quite comfortable. TOC working on hospice placement.    DVT prophylaxis: N/A Code Status: DNR Family Communication: Spoke with patient's daughter Willian Harrow who is her POA Disposition: Hospice             Pressure Injury 11/20/23 Sacrum Medial Stage 2 -  Partial thickness loss of dermis presenting as a shallow open injury with a red, pink wound bed without slough. (Active)  11/20/23 1815  Location: Sacrum  Location Orientation: Medial  Staging: Stage 2 -  Partial thickness loss of dermis presenting as a shallow open injury with a red, pink wound bed without slough.  Wound Description (Comments):   Present on Admission: Yes     Diet Orders  (From admission, onward)     Start     Ordered   11/20/23 1723  Diet regular Room service appropriate? Yes; Fluid consistency: Thin  Diet effective now       Question Answer Comment  Room service appropriate? Yes   Fluid consistency: Thin      11/20/23 1726            Objective: Vitals:   11/20/23 1217 11/20/23 1806 11/20/23 1956 11/21/23 0820  BP: (!) 106/52 130/61 (!) 121/56 (!) 136/54  Pulse: 87 85 90 (!) 103  Resp: 18 16 16 18   Temp: 98.1 F (36.7 C) 98 F (36.7 C) (!) 97 F (36.1 C) 97.6 F (36.4 C)  TempSrc: Oral Oral    SpO2: 100% 98% 98% 98%    Intake/Output Summary (Last 24 hours) at 11/21/2023 1503 Last data filed at 11/20/2023 1728 Gross per 24 hour  Intake 100 ml  Output --  Net 100 ml   There were no vitals filed for this visit.  Scheduled Meds: Continuous Infusions:  Nutritional status     There is no height or weight on file to calculate BMI.  Data Reviewed:   CBC: Recent Labs  Lab 11/20/23 1305  WBC 23.2*  HGB 9.5*  HCT 29.0*  MCV 91.5  PLT 442*   Basic Metabolic Panel: Recent Labs  Lab 11/20/23 1305  NA 131*  K 4.6  CL 102  CO2 13*  GLUCOSE 197*  BUN 79*  CREATININE 1.74*  CALCIUM  9.6   GFR: CrCl cannot be calculated (Unknown ideal weight.). Liver  Function Tests: Recent Labs  Lab 11/20/23 1305  AST 36  ALT 28  ALKPHOS 62  BILITOT 0.9  PROT 6.3*  ALBUMIN 3.1*   No results for input(s): "LIPASE", "AMYLASE" in the last 168 hours. No results for input(s): "AMMONIA" in the last 168 hours. Coagulation Profile: Recent Labs  Lab 11/20/23 1542  INR 2.5*   Cardiac Enzymes: No results for input(s): "CKTOTAL", "CKMB", "CKMBINDEX", "TROPONINI" in the last 168 hours. BNP (last 3 results) No results for input(s): "PROBNP" in the last 8760 hours. HbA1C: No results for input(s): "HGBA1C" in the last 72 hours. CBG: No results for input(s): "GLUCAP" in the last 168 hours. Lipid Profile: No results for input(s): "CHOL",  "HDL", "LDLCALC", "TRIG", "CHOLHDL", "LDLDIRECT" in the last 72 hours. Thyroid  Function Tests: No results for input(s): "TSH", "T4TOTAL", "FREET4", "T3FREE", "THYROIDAB" in the last 72 hours. Anemia Panel: No results for input(s): "VITAMINB12", "FOLATE", "FERRITIN", "TIBC", "IRON", "RETICCTPCT" in the last 72 hours. Sepsis Labs: No results for input(s): "PROCALCITON", "LATICACIDVEN" in the last 168 hours.  Recent Results (from the past 240 hours)  MRSA Next Gen by PCR, Nasal     Status: None   Collection Time: 11/20/23  6:31 PM   Specimen: Nasal Mucosa; Nasal Swab  Result Value Ref Range Status   MRSA by PCR Next Gen NOT DETECTED NOT DETECTED Final    Comment: (NOTE) The GeneXpert MRSA Assay (FDA approved for NASAL specimens only), is one component of a comprehensive MRSA colonization surveillance program. It is not intended to diagnose MRSA infection nor to guide or monitor treatment for MRSA infections. Test performance is not FDA approved in patients less than 76 years old. Performed at [facility], [address] [place], [place] [address]          Radiology Studies: CT CHEST ABDOMEN PELVIS WO CONTRAST Result Date: 11/20/2023 CLINICAL DATA:  Trauma * Tracking Code: BO * EXAM: CT CHEST, ABDOMEN AND PELVIS WITHOUT CONTRAST TECHNIQUE: Multidetector CT imaging of the chest, abdomen and pelvis was performed following the standard protocol without IV contrast. RADIATION DOSE REDUCTION: This exam was performed according to the departmental dose-optimization program which includes automated exposure control, adjustment of the mA and/or kV according to patient size and/or use of iterative reconstruction technique. COMPARISON:  12/07/2021 FINDINGS: CT CHEST FINDINGS Cardiovascular: Aortic atherosclerosis. Normal heart size. Left and right coronary artery calcifications. No pericardial effusion. Mediastinum/Nodes: No enlarged mediastinal, hilar, or axillary lymph nodes. Thyroid   gland, trachea, and esophagus demonstrate no significant findings. Lungs/Pleura: Similar appearance of a subpleural ground-glass and subsolid lesion measuring 4.0 x 3.6 cm (series 4, image 55). Similar irregular ground-glass and consolidation inferiorly in the dependent right lower lobe (series 4, image 69). New satellite lesions in the azygoesophageal recess of the right lower lobe measuring 0.6 cm (series 4, image 62) and 1.7 x 1.2 cm (series 4, image 78). No pleural effusion or pneumothorax. Musculoskeletal: No chest wall abnormality. No acute osseous findings. CT ABDOMEN PELVIS FINDINGS Hepatobiliary: No solid liver abnormality is seen. Cyst of the superior left lobe of the liver, benign, requiring no further follow-up or characterization. Status post cholecystectomy. No biliary dilatation. Pancreas: Unremarkable. No pancreatic ductal dilatation or surrounding inflammatory changes. Spleen: Normal in size without significant abnormality. Adrenals/Urinary Tract: Adrenal glands are unremarkable. Kidneys are normal, without renal calculi, solid lesion, or hydronephrosis. Bladder is unremarkable. Stomach/Bowel: Stomach is within normal limits. Appendix appears normal. No evidence of bowel wall thickening, distention, or inflammatory changes. Descending and sigmoid diverticulosis. Vascular/Lymphatic:  Aortic atherosclerosis. No enlarged abdominal or pelvic lymph nodes. Reproductive: Status post hysterectomy. Calcified lesion in the low right hemipelvis of uncertain nature, possibly involving the right ovary although unchanged compared to remote prior examination dated 12/07/2021 and benign (series 2, image 96). Other: No abdominal wall hernia or abnormality. No ascites. Low intermediate attenuation lesion in the left upper quadrant measuring 4.3 x 3.4 cm (series 2, image 50). Musculoskeletal: No acute osseous findings. IMPRESSION: 1. No noncontrast CT evidence of acute traumatic injury to the chest, abdomen, or pelvis.  2. Similar appearance of a subpleural ground-glass and subsolid lesion in the superior segment right lower lobe measuring 4.0 x 3.6 cm. Similar irregular ground-glass and consolidation inferiorly in the dependent right lower lobe. New satellite lesions in the azygoesophageal recess of the right lower lobe measuring 0.6 cm and 1.7 x 1.2 cm. Although this is possibly chronic infection or inflammation, morphology and behavior over time is worrisome for indolent, multifocal adenocarcinoma. 3. Low intermediate attenuation lesion in the left upper quadrant measuring 4.3 x 3.4 cm, slightly diminished in size compared to prior examination, definitively benign given stability over time and possibly a duplication cyst or lymphangioma. No specific further follow-up or characterization is required. 4. Descending and sigmoid diverticulosis without evidence of acute diverticulitis. 5. Coronary artery disease. Aortic Atherosclerosis (ICD10-I70.0). Electronically Signed   By: Fredricka Jenny M.D.   On: 11/20/2023 15:46   CT HEAD WO CONTRAST ( ) Result Date: 11/20/2023 CLINICAL DATA:  Trauma, fall EXAM: CT HEAD WITHOUT CONTRAST CT CERVICAL SPINE WITHOUT CONTRAST TECHNIQUE: Multidetector CT imaging of the head and cervical spine was performed following the standard protocol without intravenous contrast. Multiplanar CT image reconstructions of the cervical spine were also generated. RADIATION DOSE REDUCTION: This exam was performed according to the departmental dose-optimization program which includes automated exposure control, adjustment of the mA and/or kV according to patient size and/or use of iterative reconstruction technique. COMPARISON:  None Available. FINDINGS: CT HEAD FINDINGS Brain: No evidence of acute infarction, hemorrhage, hydrocephalus, extra-axial collection or mass lesion/mass effect. Extensive periventricular and deep white matter hypodensity. Vascular: No hyperdense vessel or unexpected calcification. Skull:  Normal. Negative for fracture or focal lesion. Sinuses/Orbits: No acute finding. Other: None. CT CERVICAL SPINE FINDINGS Alignment: Normal. Skull base and vertebrae: No acute fracture. No primary bone lesion or focal pathologic process. Soft tissues and spinal canal: No prevertebral fluid or swelling. No visible canal hematoma. Disc levels: Mild multilevel cervical disc degenerative disease. Upper chest: Negative. Other: None. IMPRESSION: 1. No acute intracranial pathology. Advanced small vessel white matter in keeping with patient age. 2. No fracture or static subluxation of the cervical spine. 3. Mild multilevel cervical disc degenerative disease. Electronically Signed   By: Fredricka Jenny M.D.   On: 11/20/2023 15:33   CT Cervical Spine Wo Contrast Result Date: 11/20/2023 CLINICAL DATA:  Trauma, fall EXAM: CT HEAD WITHOUT CONTRAST CT CERVICAL SPINE WITHOUT CONTRAST TECHNIQUE: Multidetector CT imaging of the head and cervical spine was performed following the standard protocol without intravenous contrast. Multiplanar CT image reconstructions of the cervical spine were also generated. RADIATION DOSE REDUCTION: This exam was performed according to the departmental dose-optimization program which includes automated exposure control, adjustment of the mA and/or kV according to patient size and/or use of iterative reconstruction technique. COMPARISON:  None Available. FINDINGS: CT HEAD FINDINGS Brain: No evidence of acute infarction, hemorrhage, hydrocephalus, extra-axial collection or mass lesion/mass effect. Extensive periventricular and deep white matter hypodensity. Vascular: No hyperdense vessel or unexpected calcification.  Skull: Normal. Negative for fracture or focal lesion. Sinuses/Orbits: No acute finding. Other: None. CT CERVICAL SPINE FINDINGS Alignment: Normal. Skull base and vertebrae: No acute fracture. No primary bone lesion or focal pathologic process. Soft tissues and spinal canal: No prevertebral  fluid or swelling. No visible canal hematoma. Disc levels: Mild multilevel cervical disc degenerative disease. Upper chest: Negative. Other: None. IMPRESSION: 1. No acute intracranial pathology. Advanced small vessel white matter in keeping with patient age. 2. No fracture or static subluxation of the cervical spine. 3. Mild multilevel cervical disc degenerative disease. Electronically Signed   By: Fredricka Jenny M.D.   On: 11/20/2023 15:33           LOS: 1 day   Time spent= 35 mins    Magdalene School, MD [facility]  If 7PM-7AM, please contact night-coverage  11/21/2023, 3:03 PM

## 2023-11-21 NOTE — Consult Note (Signed)
 Consultation Note Date: 11/21/2023 at 1000  Patient Name: Eileen Walsh  DOB: [birth year]  MRN: [id number]  Age / Sex: 89 y.o., female  PCP: [facility] Referring Physician: Donley Furth*  HPI/Patient Profile: 89 y.o. female  with past medical history significant for dementia, arthritis, recent R femoral hip fracture s/p percutaneous pinning 10/01/23, non-ambulatory, fibromyalgia, heart disease, HTN, DM II, anxiety and FTT. She presented to ED from [facility] via EMS c/o fall from wheelchair and black stools. ED workup showed Na+ 131, BUN 79, creatinine 1.74, albumin 3.1, GFR 27, WBC 23.2, Hgb 9.5, Hct 29, platelets 442 and INR 2.5.   CT AP demonstrated previous and new RLL lesions concerning for multifocal adenocarcinoma: 1. No noncontrast CT evidence of acute traumatic injury to the chest, abdomen, or pelvis. 2. Similar appearance of a subpleural ground-glass and subsolid lesion in the superior segment right lower lobe measuring 4.0 x 3.6 cm. Similar irregular ground-glass and consolidation inferiorly in the dependent right lower lobe. New satellite lesions in the azygoesophageal recess of the right lower lobe measuring 0.6 cm and 1.7 x 1.2 cm. Although this is possibly chronic infection or inflammation, morphology and behavior over time is worrisome for indolent, multifocal adenocarcinoma. 3. Low intermediate attenuation lesion in the left upper quadrant measuring 4.3 x 3.4 cm, slightly diminished in size compared to prior examination, definitively benign given stability over time and possibly a duplication cyst or lymphangioma. No specific further follow-up or characterization is required. 4. Descending and sigmoid diverticulosis without evidence of acute diverticulitis. 5. Coronary artery disease.  Patient daughter that accompanied patient to the ED advised that she did  not want any invasive treatments. Daughter had previously spoke with someone in palliative care at [facility] that recommended patient be placed on comfort care.   Patient was admitted for comfort measures only.   Palliative care was consulted for goals of care discussion.    Clinical Assessment and Goals of Care:  Elderly, ill-appearing female lying in bed. She is alert, but not oriented. She agitated and not able to follow commands. She denies pain, but it is not clear that she understands questions. Respirations even, unlabored. She is in no distress. RN to medicate for agitation.   Extensive chart review completed prior to meeting patient including labs, vital signs, imaging, progress notes, orders, and available advanced directive documents from current and previous encounters. I assessed patient at bedside ands then spoke with [name], daughter,  to discuss diagnosis prognosis, GOC, EOL wishes, disposition and options.  I introduced Palliative Medicine as specialized medical care for people living with serious illness. It focuses on providing relief from the symptoms and stress of a serious illness. The goal is to improve quality of life for both the patient and the family.  As far as functional and nutritional status patient resides at [facility] as family is unable to care for her. Due to dementia, she requires assistance with all ADLS. [name] reports since her mother's recent hip surgery, she has not been able to walk.  She adds that her appetite is poor with no recent eating/drinking.   We discussed patient's current illness and what it means in the larger context of patient's on-going co-morbidities.  Natural disease trajectory and expectations at EOL were discussed.  The difference between aggressive medical intervention and comfort care was considered in light of the patient's goals of care. [name] and her siblings want their mother to be comfortable and confirms that she is comfort  measures only with no aggressive medical interventions.   Education offered regarding concept specific to human mortality and the limitations of medical interventions to prolong life when the body begins to fail to thrive.  Family is facing treatment option decisions, advanced directive, and anticipatory care needs.    Discussed with patient/family the importance of continued conversation with family and the medical providers regarding overall plan of care and treatment options, ensuring decisions are within the context of the patient's values and GOCs.    Hospice and Palliative Care services outpatient were explained and offered. [name] is interested in inpatient unit hospice care.   Questions and concerns were addressed. The family was encouraged to call with questions or concerns.   Primary Decision Maker NEXT OF KIN- [name]- daughter   Physical Exam Vitals reviewed.  Constitutional:      General: She is not in acute distress.    Appearance: She is ill-appearing.     Comments: Frail, agitated  HENT:     Head: Normocephalic and atraumatic.     Mouth/Throat:     Mouth: Mucous membranes are dry.  Pulmonary:     Effort: Pulmonary effort is normal. No respiratory distress.  Musculoskeletal:     Right lower leg: No edema.     Left lower leg: No edema.  Skin:    General: Skin is warm and dry.  Neurological:     Mental Status: She is alert. She is disoriented.   Recommendations/Plan:         Focus of care is comfort and dignity allowing for natural death Utilize ordered medications to maintain comfort Consult placed to [facility] to evaluate for IPU hospice   Palliative Assessment/Data: 10-20%   Discussed plan of care with Tiffany, RN, TOC and ACC liaison   Thank you for this consult. Palliative medicine will continue to follow and assist holistically.   Time Total: 75 minutes  Time spent includes: Detailed review of medical records (labs, imaging, vital signs),  medically appropriate exam (mental status, respiratory, cardiac, skin), discussed with treatment team, counseling and educating patient, family and staff, documenting clinical information, medication management and coordination of care.     Ina Manas, [facility] Palliative Medicine Team  11/21/2023 9:51 AM  Office [phone]  Pager [phone]     Please contact Palliative Medicine Team providers via AMION for questions and concerns.

## 2023-11-21 NOTE — Progress Notes (Signed)
 [facility] LIAISON NOTE  Received request from Natasha Bailiff, Transitions of Care [facility]), for evaluation for hospice InPatient Unit [facility]).  Patient does not meet criteria for IPU at this time.  Patient is however appropriate for hospice services.  I spoke with patient's daughter, [name], daughter, POA, to initiate education related to hospice philosophy, services, and team approach to care. Patient/family verbalized understanding of information given. Per discussion, [name] would like to start a bedsearch for LTC.  She does not feel [name] would take patient back.    Information shared with hospital medical team and Natasha Bailiff, TOC.  Hospital liaison team will continue to follow patient and assess for any changes and IPU appropriateness.     Please send signed and completed DNR home with patient/family if applicable.   Please provide prescriptions at discharge as needed to ensure ongoing symptom management.  [facility] information and contact numbers given to [name].  Please call with any hospice related questions or concerns.  Thank you for the opportunity to participate in this patient's care.  Ambrosio Junker, MA, BSN, RN, FNE Nurse Liaison [phone]

## 2023-11-21 NOTE — Hospital Course (Signed)
 Hospital course:  89 year old female with advanced dementia now nonambulatory s/p recent hip fracture, CAD, HTN, DM 2, FTT was admitted yesterday with weakness and black stools.  Workup revealed new pulmonary nodules.  Family does not wish to pursue any further invasive treatment and would like for patient to be kept comfortable.  Patient is on comfort care.  TOC is working on hospice placement.  Subjective:  Patient is awake and alert, is looking at me but will not speak, she purses her lips and I believe she is trying to stick her tongue out but I am not sure.  She appears comfortable  Exam:  General: Thin female sitting up in bed appearing comfortable Eyes: sclera anicteric, conjuctiva mild injection bilaterally CVS: S1-S2, regular  Respiratory:  decreased air entry bilaterally secondary to decreased inspiratory effor GI: Scaphoid, nontender LE: Decreased muscle mass Neuro: Advanced dementia  Assessment & Plan:   Comfort care Patient with advanced dementia, now nonambulatory s/p recent hip fracture presented with melena and found to have multiple lung mets is presently on comfort care. Patient appears quite comfortable. TOC working on hospice placement.    DVT prophylaxis: N/A Code Status: DNR Family Communication: Spoke with patient's daughter Willian Harrow who is her POA Disposition: Hospice

## 2023-11-22 ENCOUNTER — Ambulatory Visit: Payer: Self-pay | Admitting: Neurosurgery

## 2023-11-22 DIAGNOSIS — Z66 Do not resuscitate: Secondary | ICD-10-CM | POA: Diagnosis not present

## 2023-11-22 DIAGNOSIS — K922 Gastrointestinal hemorrhage, unspecified: Secondary | ICD-10-CM | POA: Diagnosis not present

## 2023-11-22 DIAGNOSIS — W19XXXA Unspecified fall, initial encounter: Secondary | ICD-10-CM | POA: Diagnosis not present

## 2023-11-22 DIAGNOSIS — Z515 Encounter for palliative care: Secondary | ICD-10-CM | POA: Diagnosis not present

## 2023-11-22 MED ORDER — PANTOPRAZOLE SODIUM 40 MG IV SOLR
40.0000 mg | Freq: Every day | INTRAVENOUS | Status: DC
  Administered 2023-11-22 – 2023-11-24 (×3): 40 mg via INTRAVENOUS
  Filled 2023-11-22 (×3): qty 10

## 2023-11-22 MED ORDER — PANTOPRAZOLE SODIUM 40 MG PO TBEC: 40.0000 mg | DELAYED_RELEASE_TABLET | Freq: Every day | ORAL | Status: DC

## 2023-11-22 NOTE — TOC Progression Note (Addendum)
 Transition of Care [facility]) - Progression Note    Patient Details  Name: Eileen Walsh MRN: [id number] Date of Birth: 02-Jan-1929  Transition of Care [facility] 2) CM/SW Contact  Elsie Halo, RN Phone Number: 11/22/2023, 4:10 PM  Clinical Narrative:     TOC received a call from Powell Broad from [place] and they will accept the patient back with Hospice. TOC visited the patient's room around 2:30 pm and no family was present.  TOC outreached to the patient's daughter [name] [phone] and advised that Hospice evaluated the patient and she is not appropriate for inpatient Hospice. The plan is for the patient to return to [facility] with Hospice. The patient will need a Hospital Bed and the old bed will need to be removed prior to discharge. TOC outreached to [employer] with [facility] to share the update.        Expected Discharge Plan and Services                                               Social Determinants of Health (SDOH) Interventions SDOH Screenings   Food Insecurity: No Food Insecurity (11/20/2023)  Housing: Low Risk  (11/20/2023)  Transportation Needs: No Transportation Needs (11/20/2023)  Utilities: Not At Risk (11/20/2023)  Alcohol Screen: Low Risk  (07/15/2021)  Depression (PHQ2-9): Low Risk  (07/15/2021)  Financial Resource Strain: Low Risk  (08/31/2023)   Received from [facility]  Physical Activity: Inactive (10/09/2019)  Social Connections: Unknown (11/20/2023)  Stress: No Stress Concern Present (10/09/2019)  Tobacco Use: Low Risk  (11/20/2023)    Readmission Risk Interventions     No data to display

## 2023-11-22 NOTE — Progress Notes (Signed)
 Rounded on patient for comfort care rounds. No visitors at bedside. Patient resting peacefully with eyes closed, respirations unlabored. No distress noted.

## 2023-11-22 NOTE — Progress Notes (Signed)
 [facility] LIAISON NOTE   Patient still remains inappropriate for the Hospice Home at this time.  We are happy to re-evaluate if there are changes in her condition.  Hospice can follow patient at a LTC facility once a bed has been found  Please call with any Hospice related questions or concerns.  Thank you for the opportunity to participate in this patient's care  [facility] Liaison [phone]

## 2023-11-22 NOTE — Progress Notes (Signed)
 PROGRESS NOTE    Eileen Walsh  JXB:[id number] DOB: 10-Apr-1929 DOA: 11/20/2023 PCP: Cole Daubs, [facility] course:  89 year old female with advanced dementia now nonambulatory s/p recent hip fracture, CAD, HTN, DM 2, FTT was admitted yesterday with weakness and black stools.  Workup revealed new pulmonary nodules.  Family does not wish to pursue any further invasive treatment and would like for patient to be kept comfortable.  Patient is on comfort care.  TOC is working on hospice placement.  Subjective:  No significant events overnight, patient was resting comfortably, without any acute distress, she was trying to talk but she is mumbling, hard to understand.   Exam:  General: NAD laying comfortably in the bed, mumbling Eyes: Close CVS: S1-S2, regular  Respiratory:  decreased air entry bilaterally secondary to decreased inspiratory effor GI: Scaphoid, nontender LE: Decreased muscle mass Neuro: Advanced dementia  Assessment & Plan:   Comfort care Patient with advanced dementia, now nonambulatory s/p recent hip fracture presented with melena and found to have multiple lung mets is presently on comfort care. Patient appears quite comfortable. TOC working on hospice placement.    DVT prophylaxis: N/A Code Status: DNR Family Communication: Spoke with patient's daughter Willian Harrow who is her POA 6/9 no one available at bedside during my rounds  Disposition: LTC facility with hospice care when bed will be available.      Pressure Injury 11/20/23 Sacrum Medial Stage 2 -  Partial thickness loss of dermis presenting as a shallow open injury with a red, pink wound bed without slough. (Active)  11/20/23 1815  Location: Sacrum  Location Orientation: Medial  Staging: Stage 2 -  Partial thickness loss of dermis presenting as a shallow open injury with a red, pink wound bed without slough.  Wound Description (Comments):   Present on Admission: Yes     Diet  Orders (From admission, onward)     Start     Ordered   11/21/23 1611  DIET SOFT Room service appropriate? Yes; Fluid consistency: Thin  Diet effective now       Question Answer Comment  Room service appropriate? Yes   Fluid consistency: Thin      11/21/23 1610            Objective: Vitals:   11/20/23 1956 11/21/23 0820 11/21/23 2130 11/22/23 1527  BP: (!) 121/56 (!) 136/54 131/60 109/61  Pulse: 90 (!) 103 99 74  Resp: 16 18  14   Temp: (!) 97 F (36.1 C) 97.6 F (36.4 C) (!) 97.5 F (36.4 C) 97.8 F (36.6 C)  TempSrc:   Oral   SpO2: 98% 98% 98% 92%    Intake/Output Summary (Last 24 hours) at 11/22/2023 1652 Last data filed at 11/22/2023 1500 Gross per 24 hour  Intake 0 ml  Output --  Net 0 ml   There were no vitals filed for this visit.  Scheduled Meds:  pantoprazole   40 mg Oral Daily   Or   pantoprazole  (PROTONIX ) IV  40 mg Intravenous Daily   Continuous Infusions:  Nutritional status     There is no height or weight on file to calculate BMI.  Data Reviewed:   CBC: Recent Labs  Lab 11/20/23 1305  WBC 23.2*  HGB 9.5*  HCT 29.0*  MCV 91.5  PLT 442*   Basic Metabolic Panel: Recent Labs  Lab 11/20/23 1305  NA 131*  K 4.6  CL 102  CO2 13*  GLUCOSE 197*  BUN 79*  CREATININE 1.74*  CALCIUM  9.6   GFR: CrCl cannot be calculated (Unknown ideal weight.). Liver Function Tests: Recent Labs  Lab 11/20/23 1305  AST 36  ALT 28  ALKPHOS 62  BILITOT 0.9  PROT 6.3*  ALBUMIN 3.1*   No results for input(s): "LIPASE", "AMYLASE" in the last 168 hours. No results for input(s): "AMMONIA" in the last 168 hours. Coagulation Profile: Recent Labs  Lab 11/20/23 1542  INR 2.5*   Cardiac Enzymes: No results for input(s): "CKTOTAL", "CKMB", "CKMBINDEX", "TROPONINI" in the last 168 hours. BNP (last 3 results) No results for input(s): "PROBNP" in the last 8760 hours. HbA1C: No results for input(s): "HGBA1C" in the last 72 hours. CBG: No results for  input(s): "GLUCAP" in the last 168 hours. Lipid Profile: No results for input(s): "CHOL", "HDL", "LDLCALC", "TRIG", "CHOLHDL", "LDLDIRECT" in the last 72 hours. Thyroid  Function Tests: No results for input(s): "TSH", "T4TOTAL", "FREET4", "T3FREE", "THYROIDAB" in the last 72 hours. Anemia Panel: No results for input(s): "VITAMINB12", "FOLATE", "FERRITIN", "TIBC", "IRON", "RETICCTPCT" in the last 72 hours. Sepsis Labs: No results for input(s): "PROCALCITON", "LATICACIDVEN" in the last 168 hours.  Recent Results (from the past 240 hours)  MRSA Next Gen by PCR, Nasal     Status: None   Collection Time: 11/20/23  6:31 PM   Specimen: Nasal Mucosa; Nasal Swab  Result Value Ref Range Status   MRSA by PCR Next Gen NOT DETECTED NOT DETECTED Final    Comment: (NOTE) The GeneXpert MRSA Assay (FDA approved for NASAL specimens only), is one component of a comprehensive MRSA colonization surveillance program. It is not intended to diagnose MRSA infection nor to guide or monitor treatment for MRSA infections. Test performance is not FDA approved in patients less than 72 years old. Performed at [facility], [address], [place], [place] [address]      Radiology Studies: No results found.    LOS: 2 days   Time spent= 35 mins   Althia Atlas, MD [facility]  If 7PM-7AM, please contact night-coverage  11/22/2023, 4:52 PM

## 2023-11-22 NOTE — Care Management Important Message (Signed)
 Important Message  Patient Details  Name: Eileen Walsh MRN: [id number] Date of Birth: [birth year]   Important Message Given:  Yes - Medicare IM     Anise Kerns 11/22/2023, 2:48 PM

## 2023-11-22 NOTE — Progress Notes (Signed)
 Palliative Care Progress Note, Assessment & Plan   Patient Name: Eileen Walsh       Date: 11/22/2023 DOB: 03-30-1929  Age: 89 y.o. MRN#: [id number] Attending Physician: Althia Atlas, MD Primary Care Physician: [facility] Admit Date: 11/20/2023  Subjective: Pt sleeping soundly  HPI: 89 y.o. female  with past medical history significant for dementia, arthritis, recent R femoral hip fracture s/p percutaneous pinning 10/01/23, non-ambulatory, fibromyalgia, heart disease, HTN, DM II, anxiety and FTT. She presented to ED from [facility] via EMS c/o fall from wheelchair and black stools. ED workup showed Na+ 131, BUN 79, creatinine 1.74, albumin 3.1, GFR 27, WBC 23.2, Hgb 9.5, Hct 29, platelets 442 and INR 2.5.    CT AP demonstrated previous and new RLL lesions concerning for multifocal adenocarcinoma: 1. No noncontrast CT evidence of acute traumatic injury to the chest, abdomen, or pelvis. 2. Similar appearance of a subpleural ground-glass and subsolid lesion in the superior segment right lower lobe measuring 4.0 x 3.6 cm. Similar irregular ground-glass and consolidation inferiorly in the dependent right lower lobe. New satellite lesions in the azygoesophageal recess of the right lower lobe measuring 0.6 cm and 1.7 x 1.2 cm. Although this is possibly chronic infection or inflammation, morphology and behavior over time is worrisome for indolent, multifocal adenocarcinoma. 3. Low intermediate attenuation lesion in the left upper quadrant measuring 4.3 x 3.4 cm, slightly diminished in size compared to prior examination, definitively benign given stability over time and possibly a duplication cyst or lymphangioma. No specific further follow-up or characterization is required. 4. Descending and  sigmoid diverticulosis without evidence of acute diverticulitis. 5. Coronary artery disease.   Patient daughter that accompanied patient to the ED advised that she did not want any invasive treatments. Daughter had previously spoke with someone in palliative care at [facility] that recommended patient be placed on comfort care.    Patient was admitted for comfort measures only.    Palliative care was consulted for goals of care discussion.     Summary of counseling/coordination of care: Extensive chart review completed prior to meeting patient including labs, vital signs, imaging, progress notes, orders, and available advanced directive documents from current and previous encounters.   After reviewing the patient's chart , I assessed patient at bedside.   Elderly, ill-appearing female resting in bed. Even, unlabored respirations. She is in no distress.   Per ACC note, patient remains inappropriate for [facility] hospice at this time. TOC currently working on LTC placement with OP hospice services to follow.   Physical Exam Vitals reviewed.  Constitutional:      General: She is not in acute distress.    Appearance: She is ill-appearing.  HENT:     Head: Normocephalic and atraumatic.  Pulmonary:     Effort: Pulmonary effort is normal. No respiratory distress.  Skin:    General: Skin is warm and dry.    Recommendations/Plan:         Focus of care is comfort and dignity Utilize ordered medications to maintain comfort TOC assisting with LTC placement       Total Time 35 minutes   Time spent includes: Detailed review of medical records (labs, imaging, vital signs),  medically appropriate exam (mental status, respiratory, cardiac, skin), discussed with treatment team, counseling and educating patient, family and staff, documenting clinical information, medication management and coordination of care.     Ina Manas, Joyice [facility] Palliative Medicine Team  11/22/2023 9:37 AM  Office  [phone]  Pager [phone]

## 2023-11-22 NOTE — Progress Notes (Signed)
 Nutrition Brief Note  Chart reviewed. Pt now transitioning to comfort care.  No further nutrition interventions planned at this time.  Please re-consult as needed.   Levada Schilling, RD, LDN, CDCES Registered Dietitian III Certified Diabetes Care and Education Specialist If unable to reach this RD, please use "RD Inpatient" group chat on secure chat between hours of 8am-4 pm daily

## 2023-11-23 DIAGNOSIS — W19XXXA Unspecified fall, initial encounter: Secondary | ICD-10-CM | POA: Diagnosis not present

## 2023-11-23 DIAGNOSIS — R918 Other nonspecific abnormal finding of lung field: Secondary | ICD-10-CM

## 2023-11-23 DIAGNOSIS — N1831 Chronic kidney disease, stage 3a: Secondary | ICD-10-CM

## 2023-11-23 DIAGNOSIS — R627 Adult failure to thrive: Secondary | ICD-10-CM

## 2023-11-23 DIAGNOSIS — F0390 Unspecified dementia without behavioral disturbance: Secondary | ICD-10-CM

## 2023-11-23 DIAGNOSIS — I480 Paroxysmal atrial fibrillation: Secondary | ICD-10-CM

## 2023-11-23 DIAGNOSIS — I4891 Unspecified atrial fibrillation: Secondary | ICD-10-CM

## 2023-11-23 DIAGNOSIS — K922 Gastrointestinal hemorrhage, unspecified: Secondary | ICD-10-CM | POA: Diagnosis not present

## 2023-11-23 DIAGNOSIS — D62 Acute posthemorrhagic anemia: Secondary | ICD-10-CM

## 2023-11-23 NOTE — TOC Progression Note (Signed)
 Transition of Care [facility]) - Progression Note    Patient Details  Name: Eileen Walsh MRN: [id number] Date of Birth: 05/05/1929  Transition of Care [facility]) CM/SW Contact  Crayton Docker, RN 11/23/2023, 6:05 PM  Clinical Narrative:      CM alert to Helmut Lobe, [facility] regarding EMS transport. CM call to EMS transport to schedule BLS transport for 11/24/2023 at 1030. CM alert to Dr. Hubert Madden.   CM completed returning FL2. CM alert to Dr. Hubert Madden for signature. CM faxed returning FL2 to [facility]. CM call to [facility], phone: [phone] regarding patient return. CM spoke to [place].  Barriers to Discharge: No Barriers Identified  Expected Discharge Plan and Services    Return to prior facility Home with hospice    HH Agency:  [facility] [employer])  Social Determinants of Health (SDOH) Interventions SDOH Screenings   Food Insecurity: No Food Insecurity (11/20/2023)  Housing: Low Risk  (11/20/2023)  Transportation Needs: No Transportation Needs (11/20/2023)  Utilities: Not At Risk (11/20/2023)  Alcohol Screen: Low Risk  (07/15/2021)  Depression (PHQ2-9): Low Risk  (07/15/2021)  Financial Resource Strain: Low Risk  (08/31/2023)   Received from [facility]  Physical Activity: Inactive (10/09/2019)  Social Connections: Unknown (11/20/2023)  Stress: No Stress Concern Present (10/09/2019)  Tobacco Use: Low Risk  (11/20/2023)    Readmission Risk Interventions     No data to display

## 2023-11-23 NOTE — Progress Notes (Addendum)
 [facility] Liaison note  Spoke with patient's daughter this afternoon.  She reported that the patient's  room  at [facility] has been cleared  for the delivery of the hospital bed. Hospital bed to be delivered by 7:45PM this evening.  TOC and hospital team notified.    Please call with any hospice related questions or concerns.  Thank you for the opportunity to participate in this patient's care  [facility] Liaison [phone]

## 2023-11-23 NOTE — Progress Notes (Signed)
 PROGRESS NOTE    Eileen Walsh  UXL:[id number] DOB: 04-23-1929 DOA: 11/20/2023 PCP: Cole Daubs, [facility] course:  89 year old female with advanced dementia now nonambulatory s/p recent hip fracture, CAD, HTN, DM 2, FTT was admitted yesterday with weakness and black stools.  Workup revealed new pulmonary nodules.  Family does not wish to pursue any further invasive treatment and would like for patient to be kept comfortable.  Patient is on comfort care.  TOC is working on hospice placement.  Subjective:  No significant events overnight, patient was resting comfortably, without any acute distress, she was trying to talk with whispering voice, difficult to understand.  Patient said that she is hurting all over, denies any specific complaints.   Exam:  General: NAD laying comfortably in the bed, mumbling Eyes: Close CVS: S1-S2, regular  Respiratory:  decreased air entry bilaterally secondary to decreased inspiratory effor GI: Scaphoid, nontender LE: Decreased muscle mass Neuro: Advanced dementia  Assessment & Plan:   Comfort care Patient with advanced dementia, now nonambulatory s/p recent hip fracture presented with melena and found to have multiple lung mets is presently on comfort care. Patient appears quite comfortable. TOC working on hospice placement.    DVT prophylaxis: N/A Code Status: DNR Family Communication: Spoke with patient's daughter Willian Harrow who is her POA 6/10 no one available at bedside during my rounds  Disposition: LTC facility with hospice care when bed will be available.      Pressure Injury 11/20/23 Sacrum Medial Stage 2 -  Partial thickness loss of dermis presenting as a shallow open injury with a red, pink wound bed without slough. (Active)  11/20/23 1815  Location: Sacrum  Location Orientation: Medial  Staging: Stage 2 -  Partial thickness loss of dermis presenting as a shallow open injury with a red, pink wound bed without  slough.  Wound Description (Comments):   Present on Admission: Yes     Diet Orders (From admission, onward)     Start     Ordered   11/21/23 1611  DIET SOFT Room service appropriate? Yes; Fluid consistency: Thin  Diet effective now       Question Answer Comment  Room service appropriate? Yes   Fluid consistency: Thin      11/21/23 1610            Objective: Vitals:   11/21/23 2130 11/22/23 1527 11/22/23 2102 11/23/23 0750  BP: 131/60 109/61 (!) 117/53 (!) 129/51  Pulse: 99 74 92 (!) 104  Resp:  14 18 17   Temp: (!) 97.5 F (36.4 C) 97.8 F (36.6 C) 97.8 F (36.6 C) 98.3 F (36.8 C)  TempSrc: Oral     SpO2: 98% 92% 98% 100%   No intake or output data in the 24 hours ending 11/23/23 1612  There were no vitals filed for this visit.  Scheduled Meds:  pantoprazole   40 mg Oral Daily   Or   pantoprazole  (PROTONIX ) IV  40 mg Intravenous Daily   Continuous Infusions:  Nutritional status     There is no height or weight on file to calculate BMI.  Data Reviewed:   CBC: Recent Labs  Lab 11/20/23 1305  WBC 23.2*  HGB 9.5*  HCT 29.0*  MCV 91.5  PLT 442*   Basic Metabolic Panel: Recent Labs  Lab 11/20/23 1305  NA 131*  K 4.6  CL 102  CO2 13*  GLUCOSE 197*  BUN 79*  CREATININE 1.74*  CALCIUM  9.6   GFR:  CrCl cannot be calculated (Unknown ideal weight.). Liver Function Tests: Recent Labs  Lab 11/20/23 1305  AST 36  ALT 28  ALKPHOS 62  BILITOT 0.9  PROT 6.3*  ALBUMIN 3.1*   No results for input(s): "LIPASE", "AMYLASE" in the last 168 hours. No results for input(s): "AMMONIA" in the last 168 hours. Coagulation Profile: Recent Labs  Lab 11/20/23 1542  INR 2.5*   Cardiac Enzymes: No results for input(s): "CKTOTAL", "CKMB", "CKMBINDEX", "TROPONINI" in the last 168 hours. BNP (last 3 results) No results for input(s): "PROBNP" in the last 8760 hours. HbA1C: No results for input(s): "HGBA1C" in the last 72 hours. CBG: No results for  input(s): "GLUCAP" in the last 168 hours. Lipid Profile: No results for input(s): "CHOL", "HDL", "LDLCALC", "TRIG", "CHOLHDL", "LDLDIRECT" in the last 72 hours. Thyroid  Function Tests: No results for input(s): "TSH", "T4TOTAL", "FREET4", "T3FREE", "THYROIDAB" in the last 72 hours. Anemia Panel: No results for input(s): "VITAMINB12", "FOLATE", "FERRITIN", "TIBC", "IRON", "RETICCTPCT" in the last 72 hours. Sepsis Labs: No results for input(s): "PROCALCITON", "LATICACIDVEN" in the last 168 hours.  Recent Results (from the past 240 hours)  MRSA Next Gen by PCR, Nasal     Status: None   Collection Time: 11/20/23  6:31 PM   Specimen: Nasal Mucosa; Nasal Swab  Result Value Ref Range Status   MRSA by PCR Next Gen NOT DETECTED NOT DETECTED Final    Comment: (NOTE) The GeneXpert MRSA Assay (FDA approved for NASAL specimens only), is one component of a comprehensive MRSA colonization surveillance program. It is not intended to diagnose MRSA infection nor to guide or monitor treatment for MRSA infections. Test performance is not FDA approved in patients less than 77 years old. Performed at [facility], [address] [place], [place] [address]      Radiology Studies: No results found.    LOS: 3 days   Time spent= 35 mins   Althia Atlas, MD [facility]  If 7PM-7AM, please contact night-coverage  11/23/2023, 4:12 PM

## 2023-11-23 NOTE — Plan of Care (Signed)
  Problem: Clinical Measurements: Goal: Ability to maintain clinical measurements within normal limits will improve 11/23/2023 1906 by Lourdes Roy, LPN Outcome: Progressing 11/23/2023 1906 by Lourdes Roy, LPN Outcome: Progressing Goal: Will remain free from infection 11/23/2023 1906 by Lourdes Roy, LPN Outcome: Progressing 11/23/2023 1906 by Lourdes Roy, LPN Outcome: Progressing Goal: Diagnostic test results will improve 11/23/2023 1906 by Lourdes Roy, LPN Outcome: Progressing 11/23/2023 1906 by Lourdes Roy, LPN Outcome: Progressing Goal: Respiratory complications will improve 11/23/2023 1906 by Lourdes Roy, LPN Outcome: Progressing 11/23/2023 1906 by Lourdes Roy, LPN Outcome: Progressing Goal: Cardiovascular complication will be avoided 11/23/2023 1906 by Lourdes Roy, LPN Outcome: Progressing 11/23/2023 1906 by Lourdes Roy, LPN Outcome: Progressing   Problem: Activity: Goal: Risk for activity intolerance will decrease 11/23/2023 1906 by Lourdes Roy, LPN Outcome: Progressing 11/23/2023 1906 by Lourdes Roy, LPN Outcome: Progressing   Problem: Nutrition: Goal: Adequate nutrition will be maintained 11/23/2023 1906 by Lourdes Roy, LPN Outcome: Progressing 11/23/2023 1906 by Lourdes Roy, LPN Outcome: Progressing   Problem: Coping: Goal: Level of anxiety will decrease 11/23/2023 1906 by Lourdes Roy, LPN Outcome: Progressing 11/23/2023 1906 by Lourdes Roy, LPN Outcome: Progressing   Problem: Elimination: Goal: Will not experience complications related to bowel motility 11/23/2023 1906 by Lourdes Roy, LPN Outcome: Progressing 11/23/2023 1906 by Lourdes Roy, LPN Outcome: Progressing Goal: Will not experience complications related to urinary retention 11/23/2023 1906 by Lourdes Roy, LPN Outcome: Progressing 11/23/2023 1906 by Lourdes Roy, LPN Outcome: Progressing   Problem: Pain Managment: Goal: General  experience of comfort will improve and/or be controlled 11/23/2023 1906 by Lourdes Roy, LPN Outcome: Progressing 11/23/2023 1906 by Lourdes Roy, LPN Outcome: Progressing   Problem: Safety: Goal: Ability to remain free from injury will improve 11/23/2023 1906 by Lourdes Roy, LPN Outcome: Progressing 11/23/2023 1906 by Lourdes Roy, LPN Outcome: Progressing   Problem: Skin Integrity: Goal: Risk for impaired skin integrity will decrease 11/23/2023 1906 by Lourdes Roy, LPN Outcome: Progressing 11/23/2023 1906 by Lourdes Roy, LPN Outcome: Progressing

## 2023-11-23 NOTE — [place] FL2 (Signed)
 [place]  MEDICAID [id number] LEVEL OF CARE FORM     IDENTIFICATION  Patient Name: Eileen Walsh Birthdate: 1929-05-23 Sex: female Admission Date (Current Location): 11/20/2023  [place] and [place] Number:  Chiropodist and Address:  [facility], [address], [place], [place] [address]      Provider Number: [id number]  Attending Physician Name and Address:  Althia Atlas, MD  Relative Name and Phone Number:  Laruth Pod, son, phone: [phone]; Gable Johann, daughter, phone: [phone]    Current Level of Care: Hospital Recommended Level of Care: Assisted Living Facility Prior Approval Number:    Date Approved/Denied: 12/09/21 PASRR Number: [id number]  Discharge Plan: Other (Comment) (Return to ALF)    Current Diagnoses: Patient Active Problem List   Diagnosis Date Noted   Lower GI bleed 11/20/2023   Acute post-hemorrhagic anemia 11/20/2023   Failure to thrive in adult 11/20/2023   Dementia without behavioral disturbance (HCC) 11/20/2023   Lung mass 11/20/2023   Pressure injury of skin 11/20/2023   Malnutrition of moderate degree 10/04/2023   Hip fracture (HCC) 10/01/2023   Closed right hip fracture (HCC) 09/30/2023   Fall at home, initial encounter 09/30/2023   Chronic diastolic CHF (congestive heart failure) (HCC) 09/30/2023   Chronic kidney disease, stage 3a (HCC) 09/30/2023   UTI (urinary tract infection) 12/07/2021   Subdural hematoma (HCC) 11/27/2021   SDH (subdural hematoma) (HCC) 11/26/2021   Hypertensive urgency 11/26/2021   Syncope 11/26/2021   Generalized weakness 07/02/2021   Vertigo 07/02/2021   AF (paroxysmal atrial fibrillation) (HCC) 07/02/2021   Essential hypertension 01/12/2019   Choledocholithiasis 01/12/2019   Atrial fibrillation with RVR (HCC) 07/16/2016   Arthritis 11/05/2014   Back ache 11/05/2014   Clinical depression 11/05/2014   Diabetes (HCC) 11/05/2014   Fibrositis 11/05/2014   Acid reflux  11/05/2014   Blood glucose elevated 11/05/2014   HLD (hyperlipidemia) 11/05/2014   BP (high blood pressure) 11/05/2014   Neuropathy 11/05/2014   Adiposity 11/05/2014   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   Arteriosclerosis of coronary artery 04/06/2014   Essential (primary) hypertension 04/06/2014   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/2014   CAD (coronary artery disease) 04/06/2014    Orientation RESPIRATION BLADDER Height & Weight     Self  Normal Incontinent Weight:   Height:     BEHAVIORAL SYMPTOMS/MOOD NEUROLOGICAL BOWEL NUTRITION STATUS      Incontinent Diet (Please see discharge summary)  AMBULATORY STATUS COMMUNICATION OF NEEDS Skin   Total Care    (Ecchymosis on buttocks, non-tenting)                       Personal Care Assistance Level of Assistance  Bathing, Feeding, Dressing, Total care Bathing Assistance: Maximum assistance Feeding assistance: Maximum assistance Dressing Assistance: Maximum assistance Total Care Assistance: Maximum assistance   Functional Limitations Info             SPECIAL CARE FACTORS FREQUENCY                       Contractures      Additional Factors Info  Code Status Code Status Info: DNR             Current Medications (11/23/2023):  This is the current hospital active medication list Current Facility-Administered Medications  Medication Dose Route Frequency Provider Last Rate Last Admin   acetaminophen  (TYLENOL ) tablet 650 mg  650 mg Oral Q6H PRN Swayze,  Ava, DO       Or   acetaminophen  (TYLENOL ) suppository 650 mg  650 mg Rectal Q6H PRN Swayze, Ava, DO       artificial tears ophthalmic solution 1 drop  1 drop Both Eyes QID PRN Swayze, Ava, DO       diphenhydrAMINE (BENADRYL) injection 25 mg  25 mg Intravenous Q4H PRN Swayze, Ava, DO       glycopyrrolate (ROBINUL) tablet 1 mg  1 mg Oral Q4H PRN Swayze, Ava, DO       Or   glycopyrrolate (ROBINUL) injection 0.2 mg  0.2 mg  Subcutaneous Q4H PRN Swayze, Ava, DO       Or   glycopyrrolate (ROBINUL) injection 0.2 mg  0.2 mg Intravenous Q4H PRN Swayze, Ava, DO       LORazepam (ATIVAN) injection 2-4 mg  2-4 mg Intravenous Q4H PRN Swayze, Ava, DO       morphine  (PF) 2 MG/ML injection 2-4 mg  2-4 mg Intravenous Q30 min PRN Swayze, Ava, DO   2 mg at 11/21/23 1034   pantoprazole  (PROTONIX ) EC tablet 40 mg  40 mg Oral Daily Althia Atlas, MD       Or   pantoprazole  (PROTONIX ) injection 40 mg  40 mg Intravenous Daily Althia Atlas, MD   40 mg at 11/23/23 1044   promethazine (PHENERGAN) tablet 12.5 mg  12.5 mg Oral Q6H PRN Swayze, Ava, DO         Discharge Medications: Please see discharge summary for a list of discharge medications.  Relevant Imaging Results:  Relevant Lab Results:   Additional Information SSN: [id number]  Crayton Docker, RN

## 2023-11-23 NOTE — Plan of Care (Signed)
 Tamea Faith, LPN

## 2023-11-23 NOTE — Plan of Care (Signed)
  Problem: Education: Goal: Knowledge of General Education information will improve Description: Including pain rating scale, medication(s)/side effects and non-pharmacologic comfort measures 11/23/2023 0647 by Tamea Faith, LPN Outcome: Progressing 11/23/2023 0548 by Tamea Faith, LPN Outcome: Not Progressing   Problem: Health Behavior/Discharge Planning: Goal: Ability to manage health-related needs will improve 11/23/2023 0647 by Tamea Faith, LPN Outcome: Progressing 11/23/2023 0548 by Tamea Faith, LPN Outcome: Progressing   Problem: Clinical Measurements: Goal: Ability to maintain clinical measurements within normal limits will improve 11/23/2023 0647 by Tamea Faith, LPN Outcome: Progressing 11/23/2023 0548 by Tamea Faith, LPN Outcome: Progressing Goal: Will remain free from infection 11/23/2023 0647 by Tamea Faith, LPN Outcome: Progressing 11/23/2023 0548 by Tamea Faith, LPN Outcome: Progressing Goal: Diagnostic test results will improve 11/23/2023 0647 by Tamea Faith, LPN Outcome: Progressing 11/23/2023 0548 by Tamea Faith, LPN Outcome: Progressing Goal: Respiratory complications will improve 11/23/2023 0647 by Tamea Faith, LPN Outcome: Progressing 11/23/2023 0548 by Tamea Faith, LPN Outcome: Progressing Goal: Cardiovascular complication will be avoided 11/23/2023 0647 by Tamea Faith, LPN Outcome: Progressing 11/23/2023 0548 by Tamea Faith, LPN Outcome: Progressing   Problem: Activity: Goal: Risk for activity intolerance will decrease 11/23/2023 0647 by Tamea Faith, LPN Outcome: Progressing 11/23/2023 0548 by Tamea Faith, LPN Outcome: Not Progressing   Problem: Nutrition: Goal: Adequate nutrition will be maintained 11/23/2023 0647 by Tamea Faith, LPN Outcome: Progressing 11/23/2023 0548 by Tamea Faith, LPN Outcome: Not Progressing   Problem:  Coping: Goal: Level of anxiety will decrease 11/23/2023 0647 by Tamea Faith, LPN Outcome: Progressing 11/23/2023 0548 by Tamea Faith, LPN Outcome: Progressing   Problem: Elimination: Goal: Will not experience complications related to bowel motility 11/23/2023 0647 by Tamea Faith, LPN Outcome: Progressing 11/23/2023 0548 by Tamea Faith, LPN Outcome: Progressing Goal: Will not experience complications related to urinary retention 11/23/2023 0647 by Tamea Faith, LPN Outcome: Progressing 11/23/2023 0548 by Tamea Faith, LPN Outcome: Progressing   Problem: Pain Managment: Goal: General experience of comfort will improve and/or be controlled 11/23/2023 0647 by Tamea Faith, LPN Outcome: Progressing 11/23/2023 0548 by Tamea Faith, LPN Outcome: Progressing   Problem: Safety: Goal: Ability to remain free from injury will improve 11/23/2023 0647 by Tamea Faith, LPN Outcome: Progressing 11/23/2023 0548 by Tamea Faith, LPN Outcome: Progressing   Problem: Skin Integrity: Goal: Risk for impaired skin integrity will decrease 11/23/2023 0647 by Tamea Faith, LPN Outcome: Progressing 11/23/2023 0548 by Tamea Faith, LPN Outcome: Progressing

## 2023-11-23 NOTE — Progress Notes (Signed)
 Daily Progress Note   Date: 11/23/2023   Patient Name: Eileen Walsh  DOB: [birth year]  MRN: [id number]  Age / Sex: 89 y.o., female  Attending Physician: Althia Atlas, MD Primary Care Physician: [facility] Admit Date: 11/20/2023 Length of Stay: 3 days  Reason for Consultation: Establishing goals of care  Past Medical History:  Diagnosis Date   Arthritis    COPD (chronic obstructive pulmonary disease) (HCC)    Fibromyalgia    Hypertension    Macular degeneration     Subjective:   Subjective: Chart Reviewed. Updates received. Patient Assessed. Created space and opportunity for patient  and family to explore thoughts and feelings regarding current medical situation.  Today's Discussion: Today before meeting with the patient/family, I reviewed the chart including patient plan of care, family medicine note from late yesterday, TOC note from yesterday Karen Osmond reports that the patient back with hospice services pending equipment delivery.  No labs to comfort care status.  Review of vitals shows stable but mild tachycardia 104 this morning.  Patient has not required any symptom management medicine past 48 hours, dose previously was getting IV morphine  for 2 straight days.  Today I saw the patient at the bedside, the parents present.  She was noncommunicative but did make eye contact.  Nursing present at that time.  She appears comfortable.  Discussed Vynca management with nursing, has not required significant symptom management needs.  She only ate 1 bite of food and 1 sip of beverage today.  Discussed plans for likely discharge to [facility] with hospice, possibly today or tomorrow.  Will keep parenteral opioids available due to recent hip fracture, IV benzodiazepines because of advanced dementia and the potential for agitation/anxiety.  Review of Systems  Unable to perform ROS   Objective:   Primary Diagnoses: Present on Admission:  Lower GI bleed  Acute  post-hemorrhagic anemia  Atrial fibrillation with RVR (HCC)  Failure to thrive in adult  Essential (primary) hypertension  Dementia without behavioral disturbance (HCC)  AF (paroxysmal atrial fibrillation) (HCC)  Chronic kidney disease, stage 3a (HCC)  Lung mass  Pressure injury of skin   Vital Signs:  BP (!) 129/51 (BP Location: Left Arm)   Pulse (!) 104   Temp 98.3 F (36.8 C)   Resp 17   SpO2 100%   Physical Exam Vitals and nursing note reviewed.  Constitutional:      General: She is not in acute distress.    Appearance: She is ill-appearing. She is not toxic-appearing.  HENT:     Head: Normocephalic and atraumatic.  Cardiovascular:     Rate and Rhythm: Tachycardia present.  Pulmonary:     Effort: Pulmonary effort is normal. No respiratory distress.  Abdominal:     General: Abdomen is flat.     Palpations: Abdomen is soft.  Skin:    General: Skin is warm and dry.  Neurological:     Mental Status: She is alert.     Comments: Not verbally communicative     Palliative Assessment/Data: 10-20%   Existing Vynca/ACP Documentation: DNR effective 12/03/2023  Assessment & Plan:   HPI/Patient Profile:  89 y.o. female  with past medical history significant for dementia, arthritis, recent R femoral hip fracture s/p percutaneous pinning 10/01/23, non-ambulatory, fibromyalgia, heart disease, HTN, DM II, anxiety and FTT. She presented to ED from [facility] via EMS c/o fall from wheelchair and black stools.  ED workup showed Na+ 131, BUN 79, creatinine 1.74, albumin 3.1, GFR 27, WBC 23.2,  Hgb 9.5, Hct 29, platelets 442 and INR 2.5.   Patient daughter that accompanied patient to the ED advised that she did not want any invasive treatments. Daughter had previously spoke with someone in palliative care at [facility] that recommended patient be placed on comfort care.    Patient was admitted for comfort measures only.    Palliative care was consulted for goals of care discussion.    SUMMARY OF RECOMMENDATIONS   DNR-comfort Continue comfort care See symptom management orders below Anticipate discharge back to [facility] with hospice services Palliative medicine will continue to follow  Symptom Management:  Comments return 50 mg PR every 6 hours no pain or fever Benadryl 25 mg IV every 4 hours as needed itching Robinul 0.2 mg IV every 4 hours as needed excessive secretions Ativan 2 to 4 mg IV every 4 hours as needed anxiety, seizure Morphine  2 to 4 mg IV every 30 minutes as needed severe pain or dyspnea Phenergan 12.5 mg p.o. every 6 hours as needed nausea  Code Status: DNR-comfort  Prognosis: < 2 weeks  Discharge Planning: Long-term care with hospice services  Discussed with: Medical team, nursing team  Thank you for allowing us  to participate in the care of TREONNA KLEE PMT will continue to support holistically.  Billing based on MDM: High  Problems Addressed: One acute or chronic illness or injury that poses a threat to life or bodily function  Risks: Parenteral controlled substances   Lizbeth Right, NP Palliative Medicine Team  Team Phone # [phone] (Nights/Weekends)  02/11/2021, 8:17 AM

## 2023-11-24 DIAGNOSIS — K922 Gastrointestinal hemorrhage, unspecified: Secondary | ICD-10-CM | POA: Diagnosis not present

## 2023-11-24 NOTE — TOC Transition Note (Addendum)
 Transition of Care [facility]) - Discharge Note   Patient Details  Name: Eileen Walsh MRN: [id number] Date of Birth: 1929/01/15  Transition of [facility]) CM/SW Contact:  Crayton Docker, RN 11/24/2023, 9:44 AM   Clinical Narrative:     Patient to discharge and return to ALF, [facility]. CM call to [facility], phone: [phone] regarding patient return. CM spoke to [facility], regarding patient return and confirmed hospital bed via [facility]  was delivered. CM alert to Dr. Hubert Madden and RN Trisha Furlong. CM call to [facility] regarding BLS transport confirmation. CM spoke to [place]. BLS transport time of 1030 confirmed. CM alert to Dr. Hubert Madden, RN Trisha Furlong and Jack, [facility]. CM call to patient's son, Sheryle Donning, phone: [phone] regarding patient discharge and BLS transport time pick up of 1030.   Discharge orders noted.   Final next level of care: Home w Hospice Care Barriers to Discharge: No Barriers Identified   Patient Goals and CMS Choice    ALF, return to prior facility  Discharge Placement     ALF, return to prior facility           Discharge Plan and Services Additional resources added to the After Visit Summary for     [facility] Agency:  Lennart Quitter)  Social Drivers of Health (SDOH) Interventions SDOH Screenings   Food Insecurity: No Food Insecurity (11/20/2023)  Housing: Low Risk  (11/20/2023)  Transportation Needs: No Transportation Needs (11/20/2023)  Utilities: Not At Risk (11/20/2023)  Alcohol Screen: Low Risk  (07/15/2021)  Depression (PHQ2-9): Low Risk  (07/15/2021)  Financial Resource Strain: Low Risk  (08/31/2023)   Received from [facility]  Physical Activity: Inactive (10/09/2019)  Social Connections: Unknown (11/20/2023)  Stress: No Stress Concern Present (10/09/2019)  Tobacco Use: Low Risk  (11/20/2023)     Readmission Risk Interventions     No data to display

## 2023-11-24 NOTE — Discharge Summary (Signed)
 [facility] Discharge Summary   Patient: Eileen Walsh DGU:[id number]  PCP: [facility]  Date of admission: 11/20/2023   Date of discharge:  11/24/2023     Discharge Diagnoses:  Principal Problem:   Lower GI bleed Active Problems:   Fall at home, initial encounter   Chronic kidney disease, stage 3a (HCC)   AF (paroxysmal atrial fibrillation) (HCC)   Essential (primary) hypertension   Atrial fibrillation with RVR (HCC)   Generalized weakness   Acute post-hemorrhagic anemia   Failure to thrive in adult   Dementia without behavioral disturbance (HCC)   Lung mass   Pressure injury of skin   Admitted From: ALF/ILF  Disposition:  LTC with hospice care  Recommendations for Outpatient Follow-up:  Follow-up with hospice care Follow up LABS/TEST:  none   Contact information for after-discharge care     Destination     [facility] .   Service: Assisted Living Contact information: [address] [place]  [address] [phone]                    Diet recommendation: soft diet  Activity: The patient is advised to gradually reintroduce usual activities, as tolerated  Discharge Condition: stable  Code Status: DNR/DNI-comfort care  History of present illness: As per the H and P dictated on admission Hospital Course:  89 year old female with advanced dementia now nonambulatory s/p recent hip fracture, CAD, HTN, DM 2, FTT was admitted yesterday with weakness and black stools. Workup revealed new pulmonary nodules. Family does not wish to pursue any further invasive treatment and would like for patient to be kept comfortable. Patient is on comfort care. TOC is working on hospice placement.  Summary of her active problems in the hospital is as following.  Assessment & Plan:   Comfort care Patient with advanced dementia, now nonambulatory s/p recent hip fracture presented with melena and found to have multiple  lung mets is presently on comfort care. Patient appears quite comfortable. TOC was working on hospice placement. 6/11 patient got accepted at ALF [facility] with hospice care services.  Stable to discharge to hospice care.   Pressure Injury 11/20/23 Sacrum Medial Stage 2 -  Partial thickness loss of dermis presenting as a shallow open injury with a red, pink wound bed without slough. (Active)  11/20/23 1815  Location: Sacrum  Location Orientation: Medial  Staging: Stage 2 -  Partial thickness loss of dermis presenting as a shallow open injury with a red, pink wound bed without slough.  Wound Description (Comments):   Present on Admission: Yes  Dressing Type Foam - Lift dressing to assess site every shift 11/23/23 2000    On the day of the discharge the patient's vitals were stable, and no other acute medical condition were reported by patient. the patient was felt safe to be discharge at LTC at [facility] with hospice services.   Consultants: Palliative care and hospice care Procedures: None  Discharge Exam: General: NAD laying comfortably in the bed, mumbling Eyes: Close CVS: S1-S2, regular  Respiratory:  decreased air entry bilaterally secondary to decreased inspiratory effor GI: Scaphoid, nontender LE: Decreased muscle mass Neuro: Advanced dementia  There were no vitals filed for this visit. Vitals:   11/23/23 0750 11/23/23 1959  BP: (!) 129/51 (!) 94/58  Pulse: (!) 104 (!) 48  Resp: 17 15  Temp: 98.3 F (36.8 C) 98.5 F (36.9 C)  SpO2: 100% 95%    DISCHARGE MEDICATION: Allergies as of  11/24/2023       Reactions   Aspirin  Other (See Comments)   Sulfa Antibiotics Hives, Other (See Comments)        Medication List     STOP taking these medications    amLODipine -olmesartan  10-40 MG tablet Commonly known as: AZOR    calcium  carbonate 500 MG chewable tablet Commonly known as: TUMS - dosed in mg elemental calcium    clobetasol  cream 0.05 % Commonly known as:  TEMOVATE    DULoxetine  30 MG capsule Commonly known as: CYMBALTA    DULoxetine  60 MG capsule Commonly known as: CYMBALTA    Eliquis 5 MG Tabs tablet Generic drug: apixaban   enoxaparin  40 MG/0.4ML injection Commonly known as: LOVENOX    LUTEIN  PO   oxyCODONE  5 MG immediate release tablet Commonly known as: Oxy IR/ROXICODONE        TAKE these medications    acetaminophen  500 MG tablet Commonly known as: TYLENOL  Take 1,000 mg by mouth every 8 (eight) hours as needed for mild pain (pain score 1-3) or moderate pain (pain score 4-6).   Omeprazole  20 MG Tbec Take 1 tablet by mouth daily.   polyethylene glycol 17 g packet Commonly known as: MiraLax  Take 17 g by mouth daily.   traMADol  50 MG tablet Commonly known as: ULTRAM  Take 1 tablet (50 mg total) by mouth every 6 (six) hours as needed for moderate pain (pain score 4-6).               Discharge Care Instructions  (From admission, onward)           Start     Ordered   11/24/23 0000  Discharge wound care:       Comments: As above   11/24/23 0855           Allergies  Allergen Reactions   Aspirin  Other (See Comments)   Sulfa Antibiotics Hives and Other (See Comments)   Discharge Instructions     Call MD for:  difficulty breathing, headache or visual disturbances   Complete by: As directed    Call MD for:  severe uncontrolled pain   Complete by: As directed    Call MD for:  temperature >100.4   Complete by: As directed    Diet - low sodium heart healthy   Complete by: As directed    Discharge instructions   Complete by: As directed    Hospice care   Discharge wound care:   Complete by: As directed    As above   Increase activity slowly   Complete by: As directed        The results of significant diagnostics from this hospitalization (including imaging, microbiology, ancillary and laboratory) are listed below for reference.    Significant Diagnostic Studies: CT CHEST ABDOMEN PELVIS WO  CONTRAST Result Date: 11/20/2023 CLINICAL DATA:  Trauma * Tracking Code: BO * EXAM: CT CHEST, ABDOMEN AND PELVIS WITHOUT CONTRAST TECHNIQUE: Multidetector CT imaging of the chest, abdomen and pelvis was performed following the standard protocol without IV contrast. RADIATION DOSE REDUCTION: This exam was performed according to the departmental dose-optimization program which includes automated exposure control, adjustment of the mA and/or kV according to patient size and/or use of iterative reconstruction technique. COMPARISON:  12/07/2021 FINDINGS: CT CHEST FINDINGS Cardiovascular: Aortic atherosclerosis. Normal heart size. Left and right coronary artery calcifications. No pericardial effusion. Mediastinum/Nodes: No enlarged mediastinal, hilar, or axillary lymph nodes. Thyroid  gland, trachea, and esophagus demonstrate no significant findings. Lungs/Pleura: Similar appearance of a subpleural ground-glass and subsolid lesion  measuring 4.0 x 3.6 cm (series 4, image 55). Similar irregular ground-glass and consolidation inferiorly in the dependent right lower lobe (series 4, image 69). New satellite lesions in the azygoesophageal recess of the right lower lobe measuring 0.6 cm (series 4, image 62) and 1.7 x 1.2 cm (series 4, image 78). No pleural effusion or pneumothorax. Musculoskeletal: No chest wall abnormality. No acute osseous findings. CT ABDOMEN PELVIS FINDINGS Hepatobiliary: No solid liver abnormality is seen. Cyst of the superior left lobe of the liver, benign, requiring no further follow-up or characterization. Status post cholecystectomy. No biliary dilatation. Pancreas: Unremarkable. No pancreatic ductal dilatation or surrounding inflammatory changes. Spleen: Normal in size without significant abnormality. Adrenals/Urinary Tract: Adrenal glands are unremarkable. Kidneys are normal, without renal calculi, solid lesion, or hydronephrosis. Bladder is unremarkable. Stomach/Bowel: Stomach is within normal limits.  Appendix appears normal. No evidence of bowel wall thickening, distention, or inflammatory changes. Descending and sigmoid diverticulosis. Vascular/Lymphatic: Aortic atherosclerosis. No enlarged abdominal or pelvic lymph nodes. Reproductive: Status post hysterectomy. Calcified lesion in the low right hemipelvis of uncertain nature, possibly involving the right ovary although unchanged compared to remote prior examination dated 12/07/2021 and benign (series 2, image 96). Other: No abdominal wall hernia or abnormality. No ascites. Low intermediate attenuation lesion in the left upper quadrant measuring 4.3 x 3.4 cm (series 2, image 50). Musculoskeletal: No acute osseous findings. IMPRESSION: 1. No noncontrast CT evidence of acute traumatic injury to the chest, abdomen, or pelvis. 2. Similar appearance of a subpleural ground-glass and subsolid lesion in the superior segment right lower lobe measuring 4.0 x 3.6 cm. Similar irregular ground-glass and consolidation inferiorly in the dependent right lower lobe. New satellite lesions in the azygoesophageal recess of the right lower lobe measuring 0.6 cm and 1.7 x 1.2 cm. Although this is possibly chronic infection or inflammation, morphology and behavior over time is worrisome for indolent, multifocal adenocarcinoma. 3. Low intermediate attenuation lesion in the left upper quadrant measuring 4.3 x 3.4 cm, slightly diminished in size compared to prior examination, definitively benign given stability over time and possibly a duplication cyst or lymphangioma. No specific further follow-up or characterization is required. 4. Descending and sigmoid diverticulosis without evidence of acute diverticulitis. 5. Coronary artery disease. Aortic Atherosclerosis (ICD10-I70.0). Electronically Signed   By: Fredricka Jenny M.D.   On: 11/20/2023 15:46   CT HEAD WO CONTRAST ( ) Result Date: 11/20/2023 CLINICAL DATA:  Trauma, fall EXAM: CT HEAD WITHOUT CONTRAST CT CERVICAL SPINE WITHOUT  CONTRAST TECHNIQUE: Multidetector CT imaging of the head and cervical spine was performed following the standard protocol without intravenous contrast. Multiplanar CT image reconstructions of the cervical spine were also generated. RADIATION DOSE REDUCTION: This exam was performed according to the departmental dose-optimization program which includes automated exposure control, adjustment of the mA and/or kV according to patient size and/or use of iterative reconstruction technique. COMPARISON:  None Available. FINDINGS: CT HEAD FINDINGS Brain: No evidence of acute infarction, hemorrhage, hydrocephalus, extra-axial collection or mass lesion/mass effect. Extensive periventricular and deep white matter hypodensity. Vascular: No hyperdense vessel or unexpected calcification. Skull: Normal. Negative for fracture or focal lesion. Sinuses/Orbits: No acute finding. Other: None. CT CERVICAL SPINE FINDINGS Alignment: Normal. Skull base and vertebrae: No acute fracture. No primary bone lesion or focal pathologic process. Soft tissues and spinal canal: No prevertebral fluid or swelling. No visible canal hematoma. Disc levels: Mild multilevel cervical disc degenerative disease. Upper chest: Negative. Other: None. IMPRESSION: 1. No acute intracranial pathology. Advanced small vessel white matter in  keeping with patient age. 2. No fracture or static subluxation of the cervical spine. 3. Mild multilevel cervical disc degenerative disease. Electronically Signed   By: Fredricka Jenny M.D.   On: 11/20/2023 15:33   CT Cervical Spine Wo Contrast Result Date: 11/20/2023 CLINICAL DATA:  Trauma, fall EXAM: CT HEAD WITHOUT CONTRAST CT CERVICAL SPINE WITHOUT CONTRAST TECHNIQUE: Multidetector CT imaging of the head and cervical spine was performed following the standard protocol without intravenous contrast. Multiplanar CT image reconstructions of the cervical spine were also generated. RADIATION DOSE REDUCTION: This exam was performed  according to the departmental dose-optimization program which includes automated exposure control, adjustment of the mA and/or kV according to patient size and/or use of iterative reconstruction technique. COMPARISON:  None Available. FINDINGS: CT HEAD FINDINGS Brain: No evidence of acute infarction, hemorrhage, hydrocephalus, extra-axial collection or mass lesion/mass effect. Extensive periventricular and deep white matter hypodensity. Vascular: No hyperdense vessel or unexpected calcification. Skull: Normal. Negative for fracture or focal lesion. Sinuses/Orbits: No acute finding. Other: None. CT CERVICAL SPINE FINDINGS Alignment: Normal. Skull base and vertebrae: No acute fracture. No primary bone lesion or focal pathologic process. Soft tissues and spinal canal: No prevertebral fluid or swelling. No visible canal hematoma. Disc levels: Mild multilevel cervical disc degenerative disease. Upper chest: Negative. Other: None. IMPRESSION: 1. No acute intracranial pathology. Advanced small vessel white matter in keeping with patient age. 2. No fracture or static subluxation of the cervical spine. 3. Mild multilevel cervical disc degenerative disease. Electronically Signed   By: Fredricka Jenny M.D.   On: 11/20/2023 15:33    Microbiology: Recent Results (from the past 240 hours)  MRSA Next Gen by PCR, Nasal     Status: None   Collection Time: 11/20/23  6:31 PM   Specimen: Nasal Mucosa; Nasal Swab  Result Value Ref Range Status   MRSA by PCR Next Gen NOT DETECTED NOT DETECTED Final    Comment: (NOTE) The GeneXpert MRSA Assay (FDA approved for NASAL specimens only), is one component of a comprehensive MRSA colonization surveillance program. It is not intended to diagnose MRSA infection nor to guide or monitor treatment for MRSA infections. Test performance is not FDA approved in patients less than 4 years old. Performed at [facility] [place], [address] [place], [place] [address]       Labs: CBC: Recent Labs  Lab 11/20/23 1305  WBC 23.2*  HGB 9.5*  HCT 29.0*  MCV 91.5  PLT 442*   Basic Metabolic Panel: Recent Labs  Lab 11/20/23 1305  NA 131*  K 4.6  CL 102  CO2 13*  GLUCOSE 197*  BUN 79*  CREATININE 1.74*  CALCIUM  9.6   Liver Function Tests: Recent Labs  Lab 11/20/23 1305  AST 36  ALT 28  ALKPHOS 62  BILITOT 0.9  PROT 6.3*  ALBUMIN 3.1*   No results for input(s): LIPASE, AMYLASE in the last 168 hours. No results for input(s): AMMONIA in the last 168 hours. Cardiac Enzymes: No results for input(s): CKTOTAL, CKMB, CKMBINDEX, TROPONINI in the last 168 hours. BNP (last 3 results) Recent Labs    09/30/23 2318  BNP 24.8   CBG: No results for input(s): GLUCAP in the last 168 hours.  Time spent: 35 minutes  Signed:  Althia Atlas  [facility] 11/24/2023 8:55 AM

## 2023-12-14 DEATH — deceased
